# Patient Record
Sex: Male | Born: 1960 | Race: White | Hispanic: No | Marital: Married | Smoking: Never smoker
Health system: Southern US, Community
[De-identification: ages and names within clinical notes are randomized; demographics above are authoritative.]

## PROBLEM LIST (undated history)

## (undated) DIAGNOSIS — I10 Essential (primary) hypertension: Secondary | ICD-10-CM

## (undated) DIAGNOSIS — C801 Malignant (primary) neoplasm, unspecified: Secondary | ICD-10-CM

## (undated) HISTORY — PX: BACK SURGERY: SHX140

## (undated) HISTORY — PX: ABDOMINAL SURGERY: SHX537

## (undated) HISTORY — PX: KNEE ARTHROSCOPY: SUR90

## (undated) HISTORY — PX: LITHOTRIPSY: SUR834

## (undated) HISTORY — PX: HAND SURGERY: SHX662

---

## 1998-10-06 ENCOUNTER — Encounter: Payer: Self-pay | Admitting: Urology

## 1998-10-06 ENCOUNTER — Ambulatory Visit (HOSPITAL_COMMUNITY): Admission: RE | Admit: 1998-10-06 | Discharge: 1998-10-06 | Payer: Self-pay | Admitting: Urology

## 2005-02-07 ENCOUNTER — Emergency Department (HOSPITAL_COMMUNITY): Admission: EM | Admit: 2005-02-07 | Discharge: 2005-02-07 | Payer: Self-pay | Admitting: Emergency Medicine

## 2005-02-08 ENCOUNTER — Ambulatory Visit (HOSPITAL_COMMUNITY): Admission: RE | Admit: 2005-02-08 | Discharge: 2005-02-08 | Payer: Self-pay | Admitting: Urology

## 2005-03-21 ENCOUNTER — Ambulatory Visit (HOSPITAL_COMMUNITY): Admission: RE | Admit: 2005-03-21 | Discharge: 2005-03-21 | Payer: Self-pay | Admitting: Urology

## 2005-03-26 ENCOUNTER — Ambulatory Visit (HOSPITAL_COMMUNITY): Admission: RE | Admit: 2005-03-26 | Discharge: 2005-03-26 | Payer: Self-pay | Admitting: Family Medicine

## 2006-12-15 ENCOUNTER — Inpatient Hospital Stay (HOSPITAL_COMMUNITY): Admission: EM | Admit: 2006-12-15 | Discharge: 2006-12-18 | Payer: Self-pay | Admitting: Emergency Medicine

## 2007-10-06 ENCOUNTER — Inpatient Hospital Stay (HOSPITAL_COMMUNITY): Admission: AC | Admit: 2007-10-06 | Discharge: 2007-10-10 | Payer: Self-pay

## 2007-10-27 ENCOUNTER — Encounter: Admission: RE | Admit: 2007-10-27 | Discharge: 2008-01-25 | Payer: Self-pay | Admitting: Orthopedic Surgery

## 2009-03-24 ENCOUNTER — Encounter: Admission: RE | Admit: 2009-03-24 | Discharge: 2009-06-22 | Payer: Self-pay | Admitting: Orthopedic Surgery

## 2010-03-04 ENCOUNTER — Emergency Department (HOSPITAL_COMMUNITY)
Admission: EM | Admit: 2010-03-04 | Discharge: 2010-03-04 | Payer: Self-pay | Source: Home / Self Care | Admitting: Emergency Medicine

## 2010-03-06 ENCOUNTER — Emergency Department (HOSPITAL_COMMUNITY)
Admission: EM | Admit: 2010-03-06 | Discharge: 2010-03-06 | Payer: Self-pay | Source: Home / Self Care | Admitting: Emergency Medicine

## 2010-07-23 ENCOUNTER — Encounter: Payer: Self-pay | Admitting: Family Medicine

## 2010-09-14 LAB — URINALYSIS, ROUTINE W REFLEX MICROSCOPIC
Glucose, UA: NEGATIVE mg/dL
Leukocytes, UA: NEGATIVE
Nitrite: NEGATIVE
Protein, ur: 30 mg/dL — AB
Specific Gravity, Urine: 1.021 (ref 1.005–1.030)
pH: 6 (ref 5.0–8.0)

## 2010-09-14 LAB — URINE MICROSCOPIC-ADD ON
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
## 2010-11-14 NOTE — H&P (Signed)
NAME:  [name] [name], [name] [name] NO.:  [id number]   MEDICAL RECORD NO.:  [id number]          PATIENT TYPE:  INP   LOCATION:  4741                         FACILITY:  [facility]   PHYSICIAN:  Della Goo, M.D. DATE OF BIRTH:  January 23, 1961   DATE OF ADMISSION:  12/15/2006  DATE OF DISCHARGE:                              HISTORY & PHYSICAL   PRIMARY CARE PHYSICIAN:  [facility].   CHIEF COMPLAINT:  Nausea, vomiting, diarrhea.   HISTORY OF PRESENT ILLNESS:  This is a 50 year old male presenting to  the emergency department secondary to severe nausea, vomiting, diarrhea  x3 days.  He reports not being able to keep down any p.o. food or  fluids.  He reports having diffuse crampy abdominal pain.  He rates the  pain as being a 10/10 in intensity.  The patient reports his emesis as  being bilious.  He describes having watery diarrhea every 10 minutes.   The patient was medicated for his nausea and vomiting and received 6 L  of IV fluids for fluid rehydration and resuscitation in the emergency  department prior to admission.   The patient denies having any recent travel.  He denies eating any  unusual foods and also states that no one else at home is ill.  He  denies having any fevers, chills, chest pain, shortness of breath or  syncope.  He does report having severe weakness, cramping and severe  back pain which began before this illness and he associates his  medications of anti-inflammatory medications as causing his nausea,  vomiting and diarrhea.  The initial medications that he had taken were  Flexeril and Lodine for his back spasms.  This was on December 09, 2006, and  the prescription was changed secondary to his GI symptoms to ibuprofen  and Robaxin.  The patient does not report any relief from his symptoms  with the medication changes.   PAST MEDICAL HISTORY:  1. Irritable bowel syndrome.  2. History of nephrolithiasis.   PAST SURGICAL HISTORY:  1.  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  2. Two previous right knee surgeries.   ALLERGIES:  COMPAZINE.   SOCIAL HISTORY:  The patient is married.  Works as a Chartered certified accountant.  Nonsmoker, occasional alcohol usage.  No illicit drug usage.   FAMILY HISTORY:  Noncontributory.   REVIEW OF SYSTEMS:  Pertinents are mentioned above.   PHYSICAL EXAMINATION:  GENERAL:  A 50 year old, well-nourished, well-  developed male in discomfort, but no acute distress.  VITAL SIGNS:  Temperature 97.1, blood pressure 125/62, heart rate 85,  respirations 24.  HEENT:  Normocephalic, atraumatic.  There is no scleral icterus.  Pupils  equally round and reactive to light.  Extraocular muscles are intact.  Funduscopic benign.  Oropharynx is clear.  NECK:  Supple full range of motion.  No thyromegaly, adenopathy or  jugular venous distension.  CARDIOVASCULAR:  Regular rate and rhythm.  No murmurs, gallops or rubs.  LUNGS:  Clear to auscultation bilaterally.  ABDOMEN:  Positive bowel sounds, soft, mild diffuse tenderness.  No  rebound, no guarding.  No hepatosplenomegaly.  EXTREMITIES:  Without cyanosis, clubbing or edema.  NEUROLOGIC:  Alert and oriented x3.  There are no focal deficits,  however, the patient does have generalized weakness.   LABORATORY DATA AND X-RAY FINDINGS:  Sickles blood cell count 13.8,  hemoglobin 15.7, hematocrit 47.1, platelets 239, neutrophils 91%,  lymphocytes 6%.  Sodium 143, potassium 3.5, chloride 110, bicarb 22, BUN  8, creatinine 1.4, glucose 180.  Myoglobin 148, CK-MB less than 1.0,  troponin less than 0.05.  Urinalysis cloudy, small leukocyte esterase,  small urine bilirubin and 30 mg/dL of protein.   ASSESSMENT:  20. A 50 year old male being admitted with acute gastroenteritis,      nausea, vomiting, diarrhea.  2. Dehydration.  3. Pyuria.  4. Back pain and muscle  spasms.   PLAN:  The patient will be admitted to telemetry for monitoring.  He  will be placed on a clear liquid diet for now.  IV fluids have been  ordered for rehydration therapy along with antiemetic therapy and pain  control therapies.  Stool studies will be sent for culture and  sensitivity, C. difficile studies and ova and parasites.  The patient  will also have additional laboratory studies run this morning.  A lipase  level will be checked along with liver function test.  An acute  abdominal series will also be ordered and an abdominal ultrasound study.  The patient will be placed on DVT and GI prophylaxis at this time.      Della Goo, M.D.  Electronically Signed     HJ/MEDQ  D:  12/16/2006  T:  12/16/2006  Job:  [id number]

## 2010-11-14 NOTE — Discharge Summary (Signed)
NAME:  [name] [name], [name] [name] NO.:  [id number]   MEDICAL RECORD NO.:  [id number]          PATIENT TYPE:  INP   LOCATION:  4741                         FACILITY:  [facility]   PHYSICIAN:  Wilson Singer, M.D.DATE OF BIRTH:  31-Dec-1960   DATE OF ADMISSION:  12/15/2006  DATE OF DISCHARGE:  12/18/2006                               DISCHARGE SUMMARY   FINAL DISCHARGE DIAGNOSES:  1. Gastroenteritis, probably viral.  2. Dehydration.  3. Back pain with muscular sprain.   MEDICATIONS ON DISCHARGE:  Tramadol 50 mg 1 to 2 tablets b.i.d. as  required.   CONDITION ON DISCHARGE:  Stable.   HISTORY:  This 50 year old man gave a 3- to 4-day history of nausea,  vomiting, and diarrhea with cramping abdominal pain.  Please see initial  history and physical examination by Dr. Della Goo.  On admission,  his creatinine was elevated at 1.4 and BUN of 8.  He was treated with  intravenous fluids.  Stool was taken for C. diff toxin, which was  negative.  Also, stool cultures were negative.  Stool was also negative  for Giardia and Cryptosporidium.  Throughout his hospital stay, he  seemed to be improving.  He was empirically started on ciprofloxacin,  but in the face of absent culture results, this was discontinued on the  day of discharge.  On the day of discharge, he had been doing well, and  he had had formed stool for the last 24 hours.  There was no abdominal  pain, no nausea or vomiting.  He is eating and drinking well.   On examination today:  VITAL SIGNS:  Temperature 98.5, blood pressure 131/87, pulse 74,  saturation on room air 96%.  His abdomen is soft and nontender.   INVESTIGATIONS:  Today, show a sodium of 141, potassium 3.8, bicarbonate  26, BUN 6, creatinine 0.99.   FURTHER DISPOSITION:  He did have an ultrasound of his abdomen, whilst  in the hospital because of his abdominal pain, but this only showed mild  fatty liver infiltration.  There were no other worrisome  features.  He  has been given a prescription for tramadol to be used for his back pain  in the short term.  I have urged him to make sure he follows up with the  primary care physician.      Wilson Singer, M.D.  Electronically Signed     NCG/MEDQ  D:  12/18/2006  T:  12/18/2006  Job:  [id number]   cc:   [facility]
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
## 2010-11-30 ENCOUNTER — Encounter (HOSPITAL_COMMUNITY)
Admission: RE | Admit: 2010-11-30 | Discharge: 2010-11-30 | Disposition: A | Discharge status: Home / Self Care | Payer: PRIVATE HEALTH INSURANCE | Source: Ambulatory Visit | Attending: Orthopedic Surgery | Admitting: Orthopedic Surgery

## 2010-11-30 ENCOUNTER — Other Ambulatory Visit (HOSPITAL_COMMUNITY): Payer: Self-pay | Admitting: Orthopedic Surgery

## 2010-11-30 ENCOUNTER — Ambulatory Visit (HOSPITAL_COMMUNITY)
Admission: RE | Admit: 2010-11-30 | Discharge: 2010-11-30 | Disposition: A | Discharge status: Home / Self Care | Payer: PRIVATE HEALTH INSURANCE | Source: Ambulatory Visit | Attending: Orthopedic Surgery | Admitting: Orthopedic Surgery

## 2010-11-30 DIAGNOSIS — Z01810 Encounter for preprocedural cardiovascular examination: Secondary | ICD-10-CM | POA: Insufficient documentation

## 2010-11-30 DIAGNOSIS — M5126 Other intervertebral disc displacement, lumbar region: Secondary | ICD-10-CM

## 2010-11-30 DIAGNOSIS — Z01812 Encounter for preprocedural laboratory examination: Secondary | ICD-10-CM | POA: Insufficient documentation

## 2010-11-30 DIAGNOSIS — Z01818 Encounter for other preprocedural examination: Secondary | ICD-10-CM | POA: Insufficient documentation

## 2010-11-30 LAB — CBC
HCT: 45.8 % (ref 39.0–52.0)
Hemoglobin: 15.6 g/dL (ref 13.0–17.0)
MCH: 31.9 pg (ref 26.0–34.0)
MCV: 93.7 fL (ref 78.0–100.0)
Platelets: 221 10*3/uL (ref 150–400)
WBC: 6.2 10*3/uL (ref 4.0–10.5)

## 2010-11-30 LAB — URINALYSIS, ROUTINE W REFLEX MICROSCOPIC
Glucose, UA: NEGATIVE mg/dL
Ketones, ur: NEGATIVE mg/dL
Protein, ur: NEGATIVE mg/dL
Specific Gravity, Urine: 1.021 (ref 1.005–1.030)
pH: 5.5 (ref 5.0–8.0)

## 2010-11-30 LAB — COMPREHENSIVE METABOLIC PANEL
AST: 18 U/L (ref 0–37)
Albumin: 4.1 g/dL (ref 3.5–5.2)
Alkaline Phosphatase: 61 U/L (ref 39–117)
BUN: 14 mg/dL (ref 6–23)
Chloride: 102 mEq/L (ref 96–112)
GFR calc Af Amer: >60 mL/min (ref >60)
Potassium: 4.5 mEq/L (ref 3.5–5.1)
Sodium: 139 mEq/L (ref 135–145)
Total Bilirubin: 0.5 mg/dL (ref 0.3–1.2)
Total Protein: 6.8 g/dL (ref 6.0–8.3)

## 2010-11-30 LAB — SURGICAL PCR SCREEN: MRSA, PCR: NEGATIVE

## 2010-11-30 LAB — APTT: aPTT: 28 seconds (ref 24–37)

## 2010-11-30 LAB — ABO/RH: ABO/RH(D): A POS

## 2010-11-30 LAB — DIFFERENTIAL
Lymphocytes Relative: 26 % (ref 12–46)
Neutro Abs: 3.9 10*3/uL (ref 1.7–7.7)

## 2010-11-30 LAB — TYPE AND SCREEN: Antibody Screen: NEGATIVE

## 2010-12-04 ENCOUNTER — Other Ambulatory Visit: Payer: Self-pay | Admitting: Orthopedic Surgery

## 2010-12-04 ENCOUNTER — Inpatient Hospital Stay (HOSPITAL_COMMUNITY)
Admission: RE | Admit: 2010-12-04 | Discharge: 2010-12-05 | DRG: 491 | Disposition: A | Discharge status: Home / Self Care | Payer: PRIVATE HEALTH INSURANCE | Source: Ambulatory Visit | Attending: Orthopedic Surgery | Admitting: Orthopedic Surgery

## 2010-12-04 ENCOUNTER — Inpatient Hospital Stay (HOSPITAL_COMMUNITY): Payer: PRIVATE HEALTH INSURANCE

## 2010-12-04 DIAGNOSIS — K219 Gastro-esophageal reflux disease without esophagitis: Secondary | ICD-10-CM | POA: Diagnosis present

## 2010-12-04 DIAGNOSIS — I10 Essential (primary) hypertension: Secondary | ICD-10-CM | POA: Diagnosis present

## 2010-12-04 DIAGNOSIS — M5126 Other intervertebral disc displacement, lumbar region: Principal | ICD-10-CM | POA: Diagnosis present

## 2010-12-06 ENCOUNTER — Emergency Department (HOSPITAL_COMMUNITY): Payer: PRIVATE HEALTH INSURANCE

## 2010-12-06 ENCOUNTER — Inpatient Hospital Stay (HOSPITAL_COMMUNITY)
Admission: EM | Admit: 2010-12-06 | Discharge: 2010-12-09 | DRG: 195 | Disposition: A | Discharge status: Home / Self Care | Payer: PRIVATE HEALTH INSURANCE | Source: Ambulatory Visit | Attending: Internal Medicine | Admitting: Internal Medicine

## 2010-12-06 DIAGNOSIS — E876 Hypokalemia: Secondary | ICD-10-CM | POA: Diagnosis present

## 2010-12-06 DIAGNOSIS — J189 Pneumonia, unspecified organism: Principal | ICD-10-CM | POA: Diagnosis present

## 2010-12-06 DIAGNOSIS — I10 Essential (primary) hypertension: Secondary | ICD-10-CM | POA: Diagnosis present

## 2010-12-06 DIAGNOSIS — K5900 Constipation, unspecified: Secondary | ICD-10-CM | POA: Diagnosis present

## 2010-12-06 LAB — URINALYSIS, ROUTINE W REFLEX MICROSCOPIC
Bilirubin Urine: NEGATIVE
Glucose, UA: NEGATIVE mg/dL
Hgb urine dipstick: NEGATIVE
Ketones, ur: NEGATIVE mg/dL
Nitrite: NEGATIVE
Protein, ur: NEGATIVE mg/dL
Specific Gravity, Urine: 1.013 (ref 1.005–1.030)
Urobilinogen, UA: 0.2 mg/dL (ref 0.0–1.0)
pH: 6.5 (ref 5.0–8.0)

## 2010-12-06 LAB — TROPONIN I: Troponin I: <0.3 ng/mL (ref <0.30)

## 2010-12-06 LAB — CBC
HCT: 36.2 % — ABNORMAL LOW (ref 39.0–52.0)
Hemoglobin: 13.3 g/dL (ref 13.0–17.0)
MCHC: 34 g/dL (ref 30.0–36.0)
Platelets: 181 10*3/uL (ref 150–400)
Platelets: 196 10*3/uL (ref 150–400)
RBC: 4.18 MIL/uL — ABNORMAL LOW (ref 4.22–5.81)
RDW: 12.2 % (ref 11.5–15.5)
WBC: 9.6 10*3/uL (ref 4.0–10.5)

## 2010-12-06 LAB — DIFFERENTIAL
Basophils Relative: 0 % (ref 0–1)
Eosinophils Absolute: 0.1 10*3/uL (ref 0.0–0.7)
Lymphs Abs: 0.7 10*3/uL (ref 0.7–4.0)
Monocytes Relative: 6 % (ref 3–12)
Neutro Abs: 8.1 10*3/uL — ABNORMAL HIGH (ref 1.7–7.7)
Neutrophils Relative %: 85 % — ABNORMAL HIGH (ref 43–77)

## 2010-12-06 LAB — COMPREHENSIVE METABOLIC PANEL
ALT: 13 U/L (ref 0–53)
Albumin: 3.4 g/dL — ABNORMAL LOW (ref 3.5–5.2)
BUN: 10 mg/dL (ref 6–23)
BUN: 16 mg/dL (ref 6–23)
Calcium: 8 mg/dL — ABNORMAL LOW (ref 8.4–10.5)
Calcium: 8.9 mg/dL (ref 8.4–10.5)
Creatinine, Ser: 1.04 mg/dL (ref 0.4–1.5)
GFR calc Af Amer: >60 mL/min (ref >60)
Glucose, Bld: 119 mg/dL — ABNORMAL HIGH (ref 70–99)
Sodium: 139 mEq/L (ref 135–145)
Total Bilirubin: 0.6 mg/dL (ref 0.3–1.2)
Total Protein: 5.2 g/dL — ABNORMAL LOW (ref 6.0–8.3)
Total Protein: 6.1 g/dL (ref 6.0–8.3)

## 2010-12-06 LAB — POCT I-STAT, CHEM 8
BUN: 15 mg/dL (ref 6–23)
Calcium, Ion: 1.13 mmol/L (ref 1.12–1.32)
Chloride: 103 mEq/L (ref 96–112)
Creatinine, Ser: 1.3 mg/dL (ref 0.4–1.5)
Glucose, Bld: 124 mg/dL — ABNORMAL HIGH (ref 70–99)
HCT: 40 % (ref 39.0–52.0)
Hemoglobin: 13.6 g/dL (ref 13.0–17.0)
Potassium: 3.4 mEq/L — ABNORMAL LOW (ref 3.5–5.1)
Sodium: 138 mEq/L (ref 135–145)
TCO2: 23 mmol/L (ref 0–100)

## 2010-12-06 LAB — PROTIME-INR
INR: 0.99 (ref 0.00–1.49)
Prothrombin Time: 13.3 seconds (ref 11.6–15.2)

## 2010-12-06 LAB — CK TOTAL AND CKMB (NOT AT ARMC)
CK, MB: 1.2 ng/mL (ref 0.3–4.0)
Relative Index: 0.7 (ref 0.0–2.5)
Total CK: 182 U/L (ref 7–232)

## 2010-12-06 LAB — HIV ANTIBODY (ROUTINE TESTING W REFLEX): HIV: NONREACTIVE

## 2010-12-06 LAB — D-DIMER, QUANTITATIVE: D-Dimer, Quant: 0.54 ug/mL-FEU — ABNORMAL HIGH (ref 0.00–0.48)

## 2010-12-06 LAB — MAGNESIUM: Magnesium: 1.7 mg/dL (ref 1.5–2.5)

## 2010-12-06 LAB — APTT: aPTT: 36 seconds (ref 24–37)

## 2010-12-06 MED ORDER — IOHEXOL 300 MG/ML  SOLN
100.0000 mL | Freq: Once | INTRAMUSCULAR | Status: AC | PRN
  Administered 2010-12-06: 100 mL via INTRAVENOUS

## 2010-12-07 LAB — CBC
HCT: 36.8 % — ABNORMAL LOW (ref 39.0–52.0)
Hemoglobin: 12.4 g/dL — ABNORMAL LOW (ref 13.0–17.0)
WBC: 9.2 10*3/uL (ref 4.0–10.5)

## 2010-12-07 LAB — BASIC METABOLIC PANEL
Chloride: 104 mEq/L (ref 96–112)
GFR calc Af Amer: >60 mL/min (ref >60)
Potassium: 3.7 mEq/L (ref 3.5–5.1)

## 2010-12-08 LAB — CBC
Hemoglobin: 12.9 g/dL — ABNORMAL LOW (ref 13.0–17.0)
MCH: 31.3 pg (ref 26.0–34.0)
MCV: 92.5 fL (ref 78.0–100.0)
RBC: 4.12 MIL/uL — ABNORMAL LOW (ref 4.22–5.81)

## 2010-12-08 LAB — BASIC METABOLIC PANEL
BUN: 8 mg/dL (ref 6–23)
CO2: 29 mEq/L (ref 19–32)
Chloride: 102 mEq/L (ref 96–112)
Creatinine, Ser: 0.79 mg/dL (ref 0.4–1.5)
GFR calc Af Amer: >60 mL/min (ref >60)

## 2010-12-09 LAB — BASIC METABOLIC PANEL
BUN: 7 mg/dL (ref 6–23)
Chloride: 101 mEq/L (ref 96–112)
Creatinine, Ser: 0.75 mg/dL (ref 0.4–1.5)
GFR calc Af Amer: >60 mL/min (ref >60)
Glucose, Bld: 92 mg/dL (ref 70–99)

## 2010-12-09 LAB — CBC
HCT: 41.1 % (ref 39.0–52.0)
Hemoglobin: 14.5 g/dL (ref 13.0–17.0)
MCV: 91.7 fL (ref 78.0–100.0)
RDW: 12 % (ref 11.5–15.5)
WBC: 7.7 10*3/uL (ref 4.0–10.5)

## 2010-12-12 LAB — CULTURE, BLOOD (ROUTINE X 2): Culture: NO GROWTH

## 2010-12-14 NOTE — Discharge Summary (Signed)
[name], [name] NO.:  [id number]  MEDICAL RECORD NO.:  [id number]  LOCATION:  5506                         FACILITY:  [facility]  PHYSICIAN:  [name] Barefoot, MD    DATE OF BIRTH:  Mar 25, 1961  DATE OF ADMISSION:  12/06/2010 DATE OF DISCHARGE:  12/09/2010                              DISCHARGE SUMMARY   DISCHARGE DIAGNOSES: 1. Healthcare-associated pneumonia. 2. Hypokalemia.  OTHER PAST MEDICAL HISTORY: 1. Hypertension. 2. Recent low back surgery for L3-L4 disk herniation.  DISCHARGE MEDICATIONS: 1. Bactrim 1 tablet by mouth b.i.d. 2. Docusate 100 mg b.i.d. 3. Levaquin 750 mg p.o. daily. 4. MiraLax 17 g by mouth daily. 5. Lisinopril 10 mg 1 tablet by mouth daily. 6. Omeprazole 20 mg 1 tablet by mouth daily. 7. Oxycodone 15 mg half a tablet by mouth q.4 h as needed. 8. OxyContin 60 mg 1 tablet by mouth twice daily as needed. 9. Percocet 7.5/325 one tablet by mouth 3 times a day as needed.  DISPOSITION AND FOLLOWUP:  [name] [name] will need to follow up with his primary care physician within a week.  She will need also to follow with his orthopedic doctor.  STUDIES PERFORMED: 1. CT angio.  No evidence of significant pulmonary embolus, airspace     infiltration in the left lung likely representing pneumonia. 2. X-ray showed interval development of focal airspace disease in the     left midlung suggesting pneumonia on December 06, 2010.  BRIEF HISTORY OF PRESENT ILLNESS:  This is a 50 year old with history of hypertension, recent back surgery on December 04, 2010, at [facility] by Dr. Alveda Reasons for L3-L4 disk herniation and extrusion on the left side.  He went home on December 05, 2010, after which, the patient developed shortness of breath, cough, fevers and chills, temperature of 103 at home.  He came to the ER and x-ray showed pneumonia. 1. Healthcare-associated pneumonia.  The patient was admitted to     telemetry.  He was started on IV vancomycin, cefepime, and  Levaquin.  He received a total of 4 days of IV Levaquin and     cefepime, 3 days of vancomycin.  He will be discharged on Levaquin     p.o. for a total of 10 days and with Bactrim for a total of 8 days.     On the day of discharge, the patient was in good condition, [place]     blood cells were trending down, temperature of 99.  He is willing     to go home.  The patient will need to have a very close followup. 2. Constipation.  He was started on a bowel regimen.  He had a small     bowel movement prior to discharge. 3. Hypertension.  We are going to restart his lisinopril. 4. Hypokalemia, repleted.  On the day of discharge, the patient was in improved condition.  No shortness of breath or chest pain.  He is willing to go home.  Blood pressure 137/87, sat 97 on room air, respirations 16, pulse 62, temperature 99.0.  [place] blood cell 7.7, hemoglobin 14, platelet 241. Sodium 140, potassium 3.8, chloride 101, bicarb 29, BUN  7, creatinine 0.75.     [name] Barefoot, MD     BR/MEDQ  D:  12/09/2010  T:  12/10/2010  Job:  [id number]  Electronically Signed by [name] Barefoot MD on 12/14/2010 62:13:08 PM

## 2010-12-25 NOTE — H&P (Signed)
NAME:  [name] [name], [name] [name] NO.:  [id number]  MEDICAL RECORD NO.:  [id number]  LOCATION:  [facility]                         FACILITY:  [facility]  PHYSICIAN:  Eduard Clos, MDDATE OF BIRTH:  [birth year]  DATE OF ADMISSION:  12/06/2010 DATE OF DISCHARGE:                             HISTORY & PHYSICAL   PATIENT'S PRIMARY CARE PHYSICIAN:  Samuel Jester, MD at [facility].  SURGEON:  Nelda Severe, MD  CHIEF COMPLAINT:  Shortness of breath and fever.  HISTORY OF PRESENTING ILLNESS:  A 50 year old male with known history of hypertension who has had a low back surgery on December 04, 2010, at [facility] by Dr. Alveda Reasons for L4-L3 disk herniation and extrusion on the left side.  Had gone home on December 05, 2010, after which the patient develops shortness of breath, cough and fever and chills.  Temperature that occurred at home was measured at 103.  The patient came to the ER. X-ray at this time is showing pneumonic process.  The patient has been admitted for further workup.  The patient at this time denies any chest pain, shortness of breath that improved, had some nausea, has not thrown up, denies any abdominal pain, dysuria, discharge, diarrhea.  His surgical site at this time does not show any discharge.  Denies any headache or visual symptoms, any focal deficit, dizziness or loss of consciousness.  PAST MEDICAL HISTORY:  History of hypertension and GERD.  PAST SURGICAL HISTORY:  Has had surgeries for both his knee and also his right hand and recent surgery for his low back for L3-L4 disk herniation and extrusion.  MEDICATIONS PRIOR TO ADMISSION: 1. Percocet 7.5/325 p.o. as needed. 2. Omeprazole. 3. Lisinopril 10 mg. 4. OxyContin.  ALLERGIES:  COMPAZINE.  FAMILY HISTORY:  Significant for diabetes and hypertension.  SOCIAL HISTORY:  The patient is married.  He denies smoking cigarettes or drinking alcohol or using illegal drugs.  REVIEW OF SYSTEMS:  As per history  of presenting illness, nothing else significant.  PHYSICAL EXAMINATION:  GENERAL:  The patient examined at bedside, not in acute distress. VITAL SIGNS:  Blood pressure 130/75, pulse is 98 per minute, temperature 99.8, respirations 18 per minute and O2 sat 98% on room air. HEENT:  Anicteric.  No pallor.  No discharge from ears, eyes, nose and mouth. CHEST:  Bilateral air entry present.  No rhonchi, no crepitation. HEART:  S1 and S2 heard. ABDOMEN:  Soft and nontender.  Bowel sounds heard. CNS:  Alert, awake and oriented to time, place and person.  Moves upper and lower extremities 5/5. EXTREMITIES:  Peripheral pulses felt.  No edema.  LABORATORY DATA:  Chest x-ray shows interval development of focal airspace disease in the left mid lung suggesting pneumonia.  UA shows negative for nitrites and leukocyte.  CBC, WBC is 9.6, hemoglobin is 13.3, hematocrit is 38.7, platelets 196.  PT/INR 13.3 and 0.9.  Basic metabolic panel; sodium 139, potassium 3.5, chloride 104, carbon dioxide 25, glucose 120, BUN 15, creatinine 1, albumin 3.4, AST 15, AST 15. Some of these labs are not worsening over at this time.  Chest x-ray interval development of focal airspace disease in the left  mid lung suggesting pneumonia.  ASSESSMENT: 1. Pneumonia. 2. History of hypertension. 3. History of recent low back surgery in December 04, 2010, for L3-L4 disk     herniation.  PLAN: 1. At this time, we will admit the patient to telemetry. 2. For his pneumonia, I did discuss with Dr. Tyson Alias of Critical     Care with regard to the antibiotic regimen.  Dr. Tyson Alias has     recommended to treat nosocomial pneumonia for which we are going to     place the patient on vancomycin, cefepime. 3. As the patient has just had a recent surgery and wants to do D-     dimer, if it is high, we will get a CT angio chest. 4. Further recommendation as condition evolves.     Eduard Clos, MD     ANK/MEDQ  D:   12/06/2010  T:  12/06/2010  Job:  [id number]  cc:   Samuel Jester, MD Nelda Severe, MD  Electronically Signed by Midge Minium MD on 12/25/2010 04:54:09 AM

## 2010-12-27 NOTE — Op Note (Signed)
[name] [name], [name] [name] NO.:  [id number]  MEDICAL RECORD NO.:  [id number]  LOCATION:  5010                         FACILITY:  [facility]  PHYSICIAN:  Nelda Severe, MD      DATE OF BIRTH:  05-15-1961  DATE OF PROCEDURE:  12/04/2010 DATE OF DISCHARGE:                              OPERATIVE REPORT   SURGEON:  Nelda Severe, MD  ASSISTANT:  Lianne Cure, PA-C  PREOPERATIVE DIAGNOSES:  L3-4 disk herniation/extrusion on left side, L4- 5 stenosis.  POSTOPERATIVE DIAGNOSES:  L3-4 disk herniation/extrusion on left side, L4-5 stenosis.  OPERATIVE PROCEDURES:  Left L3-4 disk excision, left L4-5 lateral recess decompression/foraminotomy.  OPERATIVE NOTE:  The patient was placed under general endotracheal anesthesia.  Sequential compression devices were placed on both lower extremities.  He was given a gram of vancomycin intravenously.  He was positioned prone on a Jackson frame.  Care was taken to position the upper extremities so as to avoid hyperflexion and abduction of the shoulders and so as to avoid hyperflexion of the elbows.  Upper extremities was padded with foam from hands to axilla.  Thighs, knees, shins, and ankles were supported on pillows.  The site of incision was marked with a skin marker slightly left in the midline what was just to be the L3-4 and L4-5 levels.  The lumbar area was prepped DuraPrep and draped in a rectangular fashion.  The drapes were secured with Ioban.  A time-out was held during which all of the usual parameters were discussed/confirmed.  Skin was scored in line with the marked incision.  Subcutaneous tissue was injected with a mixture of 0.25% plain Marcaine and 1% lidocaine with epinephrine.  Incision was deepened to the thoracolumbar fascia which was cut longitudinally just to the left to the midline. Paraspinal muscles were reflected to the left revealing what I perceived to be the L4 lamina and spinous process.  This was  marked with Kocher. Cross-table lateral radiograph was taken confirming level.  We placed a deep Taylor retractor lateral to the apophyseal joint at L3-4.  The L3-4 interspace on the left side was cleared of soft tissue.  A high-speed bur was used to perform a conservative medial facetectomy at L3-4 on the left and laminectomy extended somewhat proximally.  Kerrison rongeur was then used to complete the laminotomy.  Ligamentum flavum was removed. There was a lot of engorgement of epidural veins which were coagulated. The L3-4 disk was soft and patulous and there was elevated posterior longitudinal ligament proximal to the L3-4 disk under which the extruded fragments which were numerous small fragments were sequestrated.  These were hooked out with a nerve hook.  The disk was further enucleated using curettes and pituitary rongeurs.  The origins of the descending L4 root and course of the L3 root appeared well decompressed.  I then performed a left-sided L4 laminectomy and L4-5 subarticular decompression.  This was obviously very tight as I removed ligamentum flavum, dura and proximal L4 root were bulging into the laminotomy. Ultimately, decompressive efforts were ceased when I was able to easily pass a Murphy ball probe into the L4-5 foramen and felt that we had adequately  decompressed the L4 nerve root.  Again, I checked the laminotomy site at L3-4 and passed a Murphy ball probe into the L3-4 foramen and felt that there was no obstruction.  There was some epidural oozing which was controlled with FloSeal. Bipolar coagulation was also used.  A #15 gauge Blake drain was placed subfascially and brought out through the skin to the left side and secured with 2-0 nylon suture.  The thoracolumbar fascia was closed using multiple interrupted #1 Vicryl sutures.  The subcutaneous layer was closed using inverted 2-0 undyed Vicryl.  The skin was closed with subcuticular 3-0 undyed Vicryl in  a running fashion and the skin edges were reinforced with Dermabond.  A Mepilex stressing was applied.  The blood loss was estimated less than 50 mL.  There were no intraoperative complications.  At the time of dictation, the patient has not yet been transferred to the operating room, so no neurologic examination is reported here.     Nelda Severe, MD     MT/MEDQ  D:  12/04/2010  T:  12/04/2010  Job:  [id number]  Electronically Signed by Nelda Severe MD on 12/27/2010 05:12:09 PM

## 2011-03-02 ENCOUNTER — Emergency Department (HOSPITAL_COMMUNITY)
Admission: EM | Admit: 2011-03-02 | Discharge: 2011-03-03 | Disposition: A | Discharge status: Home / Self Care | Payer: PRIVATE HEALTH INSURANCE | Attending: Emergency Medicine | Admitting: Emergency Medicine

## 2011-03-02 DIAGNOSIS — R109 Unspecified abdominal pain: Secondary | ICD-10-CM | POA: Insufficient documentation

## 2011-03-02 DIAGNOSIS — N200 Calculus of kidney: Secondary | ICD-10-CM | POA: Insufficient documentation

## 2011-03-02 DIAGNOSIS — I10 Essential (primary) hypertension: Secondary | ICD-10-CM | POA: Insufficient documentation

## 2011-03-02 LAB — URINALYSIS, ROUTINE W REFLEX MICROSCOPIC
Bilirubin Urine: NEGATIVE
Glucose, UA: NEGATIVE mg/dL
Ketones, ur: NEGATIVE mg/dL
Nitrite: NEGATIVE
pH: 5.5 (ref 5.0–8.0)

## 2011-03-27 LAB — PROTIME-INR
INR: 1
INR: 1
Prothrombin Time: 13
Prothrombin Time: 13.6

## 2011-03-27 LAB — CBC
HCT: 30.9 — ABNORMAL LOW
Hemoglobin: 10.1 — ABNORMAL LOW
Hemoglobin: 10.7 — ABNORMAL LOW
Hemoglobin: 11.2 — ABNORMAL LOW
MCHC: 34.5
MCHC: 34.8
MCHC: 34.8
MCV: 93.3
Platelets: 152
Platelets: 223
RBC: 3.12 — ABNORMAL LOW
RBC: 3.32 — ABNORMAL LOW
RBC: 3.42 — ABNORMAL LOW
RBC: 4.23
RDW: 14.7
RDW: 14.8
WBC: 11.9 — ABNORMAL HIGH
WBC: 12.8 — ABNORMAL HIGH
WBC: 8.9

## 2011-03-27 LAB — ABO/RH: ABO/RH(D): A POS

## 2011-03-27 LAB — COMPREHENSIVE METABOLIC PANEL
ALT: 52
AST: 71 — ABNORMAL HIGH
Albumin: 3.1 — ABNORMAL LOW
CO2: 23
Calcium: 8.2 — ABNORMAL LOW
Creatinine, Ser: 1
GFR calc Af Amer: >60
Sodium: 144
Total Protein: 5.4 — ABNORMAL LOW

## 2011-03-27 LAB — POCT I-STAT 4, (NA,K, GLUC, HGB,HCT)
Glucose, Bld: 131 — ABNORMAL HIGH
HCT: 35 — ABNORMAL LOW
Operator id: 219281

## 2011-03-27 LAB — TYPE AND SCREEN
ABO/RH(D): A POS
Antibody Screen: NEGATIVE

## 2011-03-27 LAB — BASIC METABOLIC PANEL
BUN: 8
CO2: 28
Calcium: 7.9 — ABNORMAL LOW
Creatinine, Ser: 0.88
GFR calc non Af Amer: >60
Glucose, Bld: 107 — ABNORMAL HIGH
Sodium: 139

## 2011-03-27 LAB — POCT I-STAT, CHEM 8
BUN: 11
Calcium, Ion: 1.13
Chloride: 110
Glucose, Bld: 141 — ABNORMAL HIGH
HCT: 42
TCO2: 23

## 2011-03-27 LAB — AMYLASE
Amylase: 253 — ABNORMAL HIGH
Amylase: 530 — ABNORMAL HIGH

## 2011-03-27 LAB — SAMPLE TO BLOOD BANK

## 2011-04-18 LAB — CBC
Hemoglobin: 15.7
MCHC: 33.3
MCV: 89.7
Platelets: 196
RBC: 5.25
WBC: 13.8 — ABNORMAL HIGH
WBC: 8.8

## 2011-04-18 LAB — HEPATIC FUNCTION PANEL
AST: 19
AST: 23
Albumin: 3.3 — ABNORMAL LOW
Albumin: 4.3
Alkaline Phosphatase: 50
Alkaline Phosphatase: 68
Total Bilirubin: 0.9
Total Protein: 6
Total Protein: 7.5

## 2011-04-18 LAB — URINALYSIS, ROUTINE W REFLEX MICROSCOPIC
Glucose, UA: NEGATIVE
Ketones, ur: NEGATIVE
Nitrite: NEGATIVE
Specific Gravity, Urine: 1.023
pH: 6

## 2011-04-18 LAB — RAPID URINE DRUG SCREEN, HOSP PERFORMED
Amphetamines: NOT DETECTED
Tetrahydrocannabinol: NOT DETECTED

## 2011-04-18 LAB — DIFFERENTIAL
Basophils Absolute: 0
Basophils Relative: 0
Lymphocytes Relative: 24
Lymphocytes Relative: 6 — ABNORMAL LOW
Lymphs Abs: 2.1
Monocytes Absolute: 0.5
Monocytes Relative: 4
Neutro Abs: 12.6 — ABNORMAL HIGH
Neutrophils Relative %: 67
Neutrophils Relative %: 91 — ABNORMAL HIGH

## 2011-04-18 LAB — GIARDIA/CRYPTOSPORIDIUM SCREEN(EIA)

## 2011-04-18 LAB — I-STAT 8, (EC8 V) (CONVERTED LAB)
BUN: 8
Bicarbonate: 22
Chloride: 110
Glucose, Bld: 180 — ABNORMAL HIGH
HCT: 49
Operator id: 192351
pCO2, Ven: 37 — ABNORMAL LOW
pH, Ven: 7.382 — ABNORMAL HIGH

## 2011-04-18 LAB — BASIC METABOLIC PANEL
BUN: 3 — ABNORMAL LOW
BUN: 6
BUN: 6
CO2: 25
Chloride: 106
Creatinine, Ser: 0.84
Creatinine, Ser: 0.99
GFR calc Af Amer: >60
GFR calc Af Amer: >60
GFR calc non Af Amer: >60
GFR calc non Af Amer: >60
Potassium: 3.4 — ABNORMAL LOW
Potassium: 3.8
Potassium: 4

## 2011-04-18 LAB — CLOSTRIDIUM DIFFICILE EIA
C difficile Toxins A+B, EIA: NEGATIVE
C difficile Toxins A+B, EIA: NEGATIVE
C difficile Toxins A+B, EIA: NEGATIVE

## 2011-04-18 LAB — MAGNESIUM
Magnesium: 1.7
Magnesium: 1.8
Magnesium: 1.9

## 2011-04-18 LAB — STOOL CULTURE

## 2011-04-18 LAB — URINE MICROSCOPIC-ADD ON

## 2011-04-18 LAB — POCT CARDIAC MARKERS
CKMB, poc: <1 — ABNORMAL LOW
Myoglobin, poc: 148
Operator id: 192351
Troponin i, poc: <0.05

## 2011-04-18 LAB — LIPASE, BLOOD
Lipase: 19
Lipase: 20

## 2011-04-18 LAB — CK TOTAL AND CKMB (NOT AT ARMC): Relative Index: INVALID

## 2011-04-18 LAB — TSH: TSH: 0.534

## 2012-06-05 ENCOUNTER — Emergency Department (HOSPITAL_COMMUNITY)
Admission: EM | Admit: 2012-06-05 | Discharge: 2012-06-05 | Disposition: A | Discharge status: Home / Self Care | Payer: PRIVATE HEALTH INSURANCE | Attending: Emergency Medicine | Admitting: Emergency Medicine

## 2012-06-05 ENCOUNTER — Encounter (HOSPITAL_COMMUNITY): Payer: Self-pay | Admitting: *Deleted

## 2012-06-05 ENCOUNTER — Emergency Department (HOSPITAL_COMMUNITY): Payer: PRIVATE HEALTH INSURANCE

## 2012-06-05 DIAGNOSIS — I10 Essential (primary) hypertension: Secondary | ICD-10-CM | POA: Insufficient documentation

## 2012-06-05 DIAGNOSIS — Y9389 Activity, other specified: Secondary | ICD-10-CM | POA: Insufficient documentation

## 2012-06-05 DIAGNOSIS — Z9889 Other specified postprocedural states: Secondary | ICD-10-CM | POA: Insufficient documentation

## 2012-06-05 DIAGNOSIS — S20229A Contusion of unspecified back wall of thorax, initial encounter: Secondary | ICD-10-CM | POA: Insufficient documentation

## 2012-06-05 DIAGNOSIS — Z79899 Other long term (current) drug therapy: Secondary | ICD-10-CM | POA: Insufficient documentation

## 2012-06-05 DIAGNOSIS — Y929 Unspecified place or not applicable: Secondary | ICD-10-CM | POA: Insufficient documentation

## 2012-06-05 DIAGNOSIS — W010XXA Fall on same level from slipping, tripping and stumbling without subsequent striking against object, initial encounter: Secondary | ICD-10-CM | POA: Insufficient documentation

## 2012-06-05 HISTORY — DX: Essential (primary) hypertension: I10

## 2012-06-05 MED ORDER — OXYCODONE-ACETAMINOPHEN 5-325 MG PO TABS
2.0000 | ORAL_TABLET | Freq: Once | ORAL | Status: AC
  Administered 2012-06-05: 2 via ORAL
  Filled 2012-06-05: qty 2

## 2012-06-05 NOTE — ED Notes (Signed)
Pt reports slipping on wet steps, fell, and hit back. Pt has abrasion noted to back. Pt also c/o severe pain. States has had surgery June last year for bulging disc.

## 2012-06-05 NOTE — ED Provider Notes (Signed)
Medical screening examination/treatment/procedure(s) were performed by non-physician practitioner and as supervising physician I was immediately available for consultation/collaboration.  Vimal Derego, MD 06/05/12 2344 

## 2012-06-05 NOTE — ED Provider Notes (Signed)
History     CSN: [id number]  Arrival date & time 06/05/12  2104   First MD Initiated Contact with Patient 06/05/12 2121      Chief Complaint  Patient presents with  . Back Pain    (Consider location/radiation/quality/duration/timing/severity/associated sxs/prior treatment) HPI Comments: With low-back pain after slipping on his way back steps and falling directly onto his lower back about 2 hours ago. Denies hitting his head or loss of consciousness. He had surgery in the area where he hit one year ago and is concerned that he may have "messed something up". Currently pain is rated 7/10 which has slightly decreased after he took a Percocet directly after the fall. He takes Percocet daily for his chronic back pain after surgery. The Percocet provided mild relief. Denies pain, numbness or tingling down his extremities. No loss of control of bowels or bladder or saddle anesthesia. States there is an abrasion on his back.  The history is provided by the patient and the spouse.    Past Medical History  Diagnosis Date  . Hypertension     Past Surgical History  Procedure Date  . Back surgery     bulging disc repair  . Knee arthroscopy   . Hand surgery   . Lithotripsy     History reviewed. No pertinent family history.  History  Substance Use Topics  . Smoking status: Not on file  . Smokeless tobacco: Not on file  . Alcohol Use: No      Review of Systems  HENT: Negative for neck pain.   Musculoskeletal: Positive for back pain.  Skin: Positive for wound.  Neurological: Negative for numbness.    Allergies  Compazine  Home Medications   Current Outpatient Rx  Name  Route  Sig  Dispense  Refill  . ALPRAZOLAM 0.5 MG PO TABS   Oral   Take 0.5 mg by mouth 3 (three) times daily as needed. anxiety         . IBUPROFEN 800 MG PO TABS   Oral   Take 800 mg by mouth every 8 (eight) hours as needed. Pain/inflammation         . LISINOPRIL 10 MG PO TABS   Oral   Take 10  mg by mouth daily.         Marland Kitchen OMEPRAZOLE 20 MG PO CPDR   Oral   Take 20 mg by mouth 2 (two) times daily.         . OXYCODONE HCL 5 MG PO CAPS   Oral   Take 10 mg by mouth every 4 (four) hours as needed. Breakthrough pain         . OXYCODONE HCL ER 60 MG PO T12A   Oral   Take 1 tablet by mouth 3 (three) times daily.         Marland Kitchen ZOLPIDEM TARTRATE 10 MG PO TABS   Oral   Take 10 mg by mouth at bedtime as needed. sleep           BP 140/79  Pulse 75  Temp 98.9 F (37.2 C) (Oral)  Resp 20  Ht 5\' 4"  (1.626 m)  Wt 185 lb (83.915 kg)  BMI 31.76 kg/m2  SpO2 95%  Physical Exam  Constitutional: He is oriented to person, place, and time. He appears well-developed and well-nourished. No distress.       Standing in room, pacing, appears uncomfortable.  HENT:  Head: Normocephalic and atraumatic.  Eyes: Conjunctivae normal are normal.  Neck: Normal range of motion.  Cardiovascular: Normal rate, regular rhythm, normal heart sounds and intact distal pulses.   Pulmonary/Chest: Effort normal and breath sounds normal.  Musculoskeletal:       Lumbar back: He exhibits decreased range of motion, tenderness and bony tenderness. He exhibits normal pulse.       Back:  Neurological: He is alert and oriented to person, place, and time. No sensory deficit. Gait normal.  Skin: Skin is warm and dry.     Psychiatric: His speech is normal and behavior is normal. His mood appears anxious.    ED Course  Procedures (including critical care time)  Labs Reviewed - No data to display Dg Lumbar Spine Complete  06/05/2012  *RADIOLOGY REPORT*  Clinical Data: Lower back pain, status post fall.  LUMBAR SPINE - COMPLETE 4+ VIEW  Comparison: CT of the abdomen and pelvis performed 03/06/2010  Findings: There is no evidence of fracture or subluxation. Vertebral bodies demonstrate normal height and alignment. Intervertebral disc spaces are preserved.  Mild facet disease is noted along the lower lumbar  spine.  The visualized bowel gas pattern is unremarkable in appearance; air and stool are noted within the colon.  The sacroiliac joints are within normal limits.  Scattered bilateral renal stones are again noted.  IMPRESSION:  1.  No evidence of fracture or subluxation along the lumbar spine. 2.  Scattered bilateral renal stones again seen.   Original Report Authenticated By: Tonia Ghent, M.D.      1. Back contusion       MDM  51 year old male presenting with low-back pain after falling directly onto his back. He is worried that he may of messed up his prior surgery. X-ray normal. He is aware of the kidney stone seen on x-ray. He has appointment with his back doctor on Tuesday. Aware to rest until followup with his doctor. He has pain medicine at home that he can take.        Trevor Mace, PA-C 06/05/12 2301

## 2017-02-14 ENCOUNTER — Emergency Department (HOSPITAL_COMMUNITY)
Admission: EM | Admit: 2017-02-14 | Discharge: 2017-02-14 | Disposition: A | Discharge status: Home / Self Care | Payer: BLUE CROSS/BLUE SHIELD | Attending: Emergency Medicine | Admitting: Emergency Medicine

## 2017-02-14 ENCOUNTER — Encounter (HOSPITAL_COMMUNITY): Payer: Self-pay

## 2017-02-14 DIAGNOSIS — I10 Essential (primary) hypertension: Secondary | ICD-10-CM | POA: Insufficient documentation

## 2017-02-14 DIAGNOSIS — G8929 Other chronic pain: Secondary | ICD-10-CM | POA: Insufficient documentation

## 2017-02-14 DIAGNOSIS — Z79899 Other long term (current) drug therapy: Secondary | ICD-10-CM | POA: Diagnosis not present

## 2017-02-14 DIAGNOSIS — Z791 Long term (current) use of non-steroidal anti-inflammatories (NSAID): Secondary | ICD-10-CM | POA: Insufficient documentation

## 2017-02-14 DIAGNOSIS — F1123 Opioid dependence with withdrawal: Secondary | ICD-10-CM | POA: Insufficient documentation

## 2017-02-14 DIAGNOSIS — F1193 Opioid use, unspecified with withdrawal: Secondary | ICD-10-CM

## 2017-02-14 MED ORDER — SODIUM CHLORIDE 0.9 % IV BOLUS (SEPSIS)
2000.0000 mL | Freq: Once | INTRAVENOUS | Status: AC
  Administered 2017-02-14: 2000 mL via INTRAVENOUS

## 2017-02-14 MED ORDER — CLONIDINE HCL 0.1 MG PO TABS: 0.1000 mg | ORAL_TABLET | Freq: Four times a day (QID) | ORAL | 0 refills | Status: DC | PRN

## 2017-02-14 MED ORDER — ZOLPIDEM TARTRATE 5 MG PO TABS: 5.0000 mg | ORAL_TABLET | Freq: Every evening | ORAL | 0 refills | Status: DC | PRN

## 2017-02-14 MED ORDER — KETOROLAC TROMETHAMINE 15 MG/ML IJ SOLN
15.0000 mg | Freq: Once | INTRAMUSCULAR | Status: AC
  Administered 2017-02-14: 15 mg via INTRAVENOUS
  Filled 2017-02-14: qty 1

## 2017-02-14 MED ORDER — ONDANSETRON 8 MG PO TBDP: 8.0000 mg | ORAL_TABLET | Freq: Three times a day (TID) | ORAL | 0 refills | Status: DC | PRN

## 2017-02-14 MED ORDER — METHOCARBAMOL 500 MG PO TABS
500.0000 mg | ORAL_TABLET | Freq: Once | ORAL | Status: AC
  Administered 2017-02-14: 500 mg via ORAL
  Filled 2017-02-14: qty 1

## 2017-02-14 MED ORDER — CLONIDINE HCL 0.1 MG PO TABS
0.2000 mg | ORAL_TABLET | Freq: Once | ORAL | Status: AC
  Administered 2017-02-14: 0.2 mg via ORAL
  Filled 2017-02-14: qty 2

## 2017-02-14 NOTE — ED Triage Notes (Signed)
Pt wants detox from opiate pain medication, he has been on them since 2009 after an accident and has recently been decreasing his dose He decided he wanted to come off them for good and today is 72hours without any pain meds Pt is very weak and having diarrhea

## 2017-02-14 NOTE — ED Provider Notes (Signed)
[facility]-EMERGENCY DEPT Provider Note   CSN: [id number] Arrival date & time: 02/14/17  0704     History   Chief Complaint Chief Complaint  Patient presents with  . detox    HPI [name] [name] is a 56 y.o. male.  HPI Patient is a 56 year old male with a long-standing history of chronic back pain for which she is prescribed opioid pain medications.  He's been on these for years and has decided recently that he no longer wants to be on his medications.  He is slowly tapered himself over the past several weeks and 72 hours ago stopped completely.  He presents emergency department with diarrhea over the past 24 hours of abdominal cramping and is requesting IV fluids.  He seems very focused on stopping his pain medication altogether and has the support of his wife.  He has a team to continue to help with his symptoms including physical therapy and a chiropracticspecialist.  No lightheadedness.  He reportsfeeling generally weak.symptoms are moderate in severity   Past Medical History:  Diagnosis Date  . Hypertension     There are no active problems to display for this patient.   Past Surgical History:  Procedure Laterality Date  . BACK SURGERY     bulging disc repair  . HAND SURGERY    . KNEE ARTHROSCOPY    . LITHOTRIPSY         Home Medications    Prior to Admission medications   Medication Sig Start Date End Date Taking? Authorizing Provider  ALPRAZolam Prudy Feeler) 0.5 MG tablet Take 0.5 mg by mouth 3 (three) times daily as needed. anxiety    [provider]  ibuprofen (ADVIL,MOTRIN) 800 MG tablet Take 800 mg by mouth every 8 (eight) hours as needed. Pain/inflammation    [provider]  lisinopril (PRINIVIL,ZESTRIL) 10 MG tablet Take 10 mg by mouth daily.    [provider]  omeprazole (PRILOSEC) 20 MG capsule Take 20 mg by mouth 2 (two) times daily.    [provider]  oxycodone (OXY-IR) 5 MG capsule Take 10 mg by mouth every 4 (four) hours as  needed. Breakthrough pain    [provider]  oxycodone (ROXICODONE) 30 MG immediate release tablet Take 30 mg by mouth 2 (two) times daily as needed for pain. 02/05/17   [provider]  OxyCODONE HCl ER 60 MG T12A Take 1 tablet by mouth 3 (three) times daily.    [provider]  zolpidem (AMBIEN) 10 MG tablet Take 10 mg by mouth at bedtime as needed. sleep    [provider]    Family History History reviewed. No pertinent family history.  Social History Social History  Substance Use Topics  . Smoking status: Never Smoker  . Smokeless tobacco: Never Used  . Alcohol use No     Allergies   Compazine [prochlorperazine edisylate]   Review of Systems Review of Systems  All other systems reviewed and are negative.    Physical Exam Updated Vital Signs BP (!) 151/106 (BP Location: Left Arm)   Pulse 93   Temp 97.7 F (36.5 C) (Oral)   Resp 18   SpO2 96%   Physical Exam  Constitutional: He is oriented to person, place, and time. He appears well-developed and well-nourished.  HENT:  Head: Normocephalic and atraumatic.  Eyes: EOM are normal.  Neck: Normal range of motion.  Cardiovascular: Normal rate, regular rhythm, normal heart sounds and intact distal pulses.   Pulmonary/Chest: Effort normal and  breath sounds normal. No respiratory distress.  Abdominal: Soft. He exhibits no distension. There is no tenderness.  Musculoskeletal: Normal range of motion.  Neurological: He is alert and oriented to person, place, and time.  Skin: Skin is warm and dry.  Psychiatric: He has a normal mood and affect. Judgment normal.  Nursing note and vitals reviewed.    ED Treatments / Results  Labs (all labs ordered are listed, but only abnormal results are displayed) Labs Reviewed - No data to display  EKG  EKG Interpretation None       Radiology No results found.  Procedures Procedures (including critical care time)  Medications Ordered in  ED Medications  sodium chloride 0.9 % bolus 2,000 mL (not administered)  ketorolac (TORADOL) 15 MG/ML injection 15 mg (not administered)  cloNIDine (CATAPRES) tablet 0.2 mg (not administered)  methocarbamol (ROBAXIN) tablet 500 mg (not administered)     Initial Impression / Assessment and Plan / ED Course  I have reviewed the triage vital signs and the nursing notes.  Pertinent labs & imaging results that were available during my care of the patient were reviewed by me and considered in my medical decision making (see chart for details).     10:28 AM Patient feels much better at this time.  Discharge home in good condition.  Primary care follow-up.  Home with standard withdrawal medications  Final Clinical Impressions(s) / ED Diagnoses   Final diagnoses:  Opioid withdrawal [facility](HCC)    New Prescriptions New Prescriptions   No medications on file     Azalia Bilisampos, Azir Muzyka, MD 02/14/17 1028

## 2018-04-13 ENCOUNTER — Emergency Department (HOSPITAL_COMMUNITY): Payer: BLUE CROSS/BLUE SHIELD

## 2018-04-13 ENCOUNTER — Other Ambulatory Visit: Payer: Self-pay

## 2018-04-13 ENCOUNTER — Encounter (HOSPITAL_COMMUNITY): Payer: Self-pay | Admitting: Emergency Medicine

## 2018-04-13 ENCOUNTER — Emergency Department (HOSPITAL_COMMUNITY)
Admission: EM | Admit: 2018-04-13 | Discharge: 2018-04-13 | Disposition: A | Discharge status: Home / Self Care | Payer: BLUE CROSS/BLUE SHIELD | Attending: Emergency Medicine | Admitting: Emergency Medicine

## 2018-04-13 DIAGNOSIS — R1031 Right lower quadrant pain: Secondary | ICD-10-CM | POA: Diagnosis present

## 2018-04-13 DIAGNOSIS — N200 Calculus of kidney: Secondary | ICD-10-CM | POA: Diagnosis not present

## 2018-04-13 DIAGNOSIS — I10 Essential (primary) hypertension: Secondary | ICD-10-CM | POA: Insufficient documentation

## 2018-04-13 LAB — URINALYSIS, ROUTINE W REFLEX MICROSCOPIC
BILIRUBIN URINE: NEGATIVE
Bacteria, UA: NONE SEEN
Glucose, UA: NEGATIVE mg/dL
Ketones, ur: NEGATIVE mg/dL
Leukocytes, UA: NEGATIVE
NITRITE: NEGATIVE
Protein, ur: 30 mg/dL — AB
Specific Gravity, Urine: 1.015 (ref 1.005–1.030)
pH: 6 (ref 5.0–8.0)

## 2018-04-13 LAB — BASIC METABOLIC PANEL
Anion gap: 12 (ref 5–15)
BUN: 13 mg/dL (ref 6–20)
CALCIUM: 9.3 mg/dL (ref 8.9–10.3)
CO2: 22 mmol/L (ref 22–32)
CREATININE: 1.01 mg/dL (ref 0.61–1.24)
Chloride: 104 mmol/L (ref 98–111)
GFR calc Af Amer: >60 mL/min (ref >60)
GFR calc non Af Amer: >60 mL/min (ref >60)
GLUCOSE: 141 mg/dL — AB (ref 70–99)
Potassium: 3.7 mmol/L (ref 3.5–5.1)
SODIUM: 138 mmol/L (ref 135–145)

## 2018-04-13 LAB — CBC
HCT: 47 % (ref 39.0–52.0)
Hemoglobin: 15.4 g/dL (ref 13.0–17.0)
MCH: 30.4 pg (ref 26.0–34.0)
MCHC: 32.8 g/dL (ref 30.0–36.0)
MCV: 92.9 fL (ref 80.0–100.0)
PLATELETS: 251 10*3/uL (ref 150–400)
RBC: 5.06 MIL/uL (ref 4.22–5.81)
RDW: 13.3 % (ref 11.5–15.5)
WBC: 10.8 10*3/uL — ABNORMAL HIGH (ref 4.0–10.5)
nRBC: 0 % (ref 0.0–0.2)

## 2018-04-13 MED ORDER — HYDROCODONE-ACETAMINOPHEN 5-325 MG PO TABS: 2.0000 | ORAL_TABLET | Freq: Four times a day (QID) | ORAL | 0 refills | Status: DC | PRN

## 2018-04-13 MED ORDER — HYDROMORPHONE HCL 1 MG/ML IJ SOLN
1.0000 mg | Freq: Once | INTRAMUSCULAR | Status: AC
  Administered 2018-04-13: 1 mg via INTRAVENOUS
  Filled 2018-04-13: qty 1

## 2018-04-13 MED ORDER — ONDANSETRON HCL 4 MG/2ML IJ SOLN
4.0000 mg | Freq: Once | INTRAMUSCULAR | Status: AC
  Administered 2018-04-13: 4 mg via INTRAVENOUS
  Filled 2018-04-13: qty 2

## 2018-04-13 MED ORDER — HYDROCODONE-ACETAMINOPHEN 5-325 MG PO TABS
2.0000 | ORAL_TABLET | Freq: Once | ORAL | Status: AC
  Administered 2018-04-13: 2 via ORAL
  Filled 2018-04-13: qty 2

## 2018-04-13 MED ORDER — KETOROLAC TROMETHAMINE 30 MG/ML IJ SOLN
30.0000 mg | Freq: Once | INTRAMUSCULAR | Status: AC
  Administered 2018-04-13: 30 mg via INTRAVENOUS
  Filled 2018-04-13: qty 1

## 2018-04-13 MED ORDER — TAMSULOSIN HCL 0.4 MG PO CAPS: 0.4000 mg | ORAL_CAPSULE | Freq: Two times a day (BID) | ORAL | 0 refills | Status: AC

## 2018-04-13 NOTE — ED Triage Notes (Deleted)
Patient complaining of having trouble urinating and having pain in lower abdomin. Patient walked in without any difficulty. Patient also state he is having trouble urinating. Patient is not having any burning while urinating. 

## 2018-04-13 NOTE — ED Provider Notes (Signed)
Took patient care handoff report from [employer], [place]. Plan: Patient with renal colic.  Sure pain is reasonably controlled and discharge.   HPI from Ivar Drape, PA-C: "Patient presents to the emergency department with chief complaint of right flank pain.  He reports onset of symptoms tonight at 2:30 AM.  He states that it radiates to his right groin.  Reports some urinary hesitancy.  He reports a history of kidney stones.  He states that this feels the same.  He rates his pain is severe.  He denies any aggravating or alleviating factors.  He has required lithotripsy and ureteral stents in the past."  Abnormal Labs Reviewed  URINALYSIS, ROUTINE W REFLEX MICROSCOPIC - Abnormal; Notable for the following components:      Result Value   Hgb urine dipstick LARGE (*)    Protein, ur 30 (*)    RBC / HPF >50 (*)    All other components within normal limits  BASIC METABOLIC PANEL - Abnormal; Notable for the following components:   Glucose, Bld 141 (*)    All other components within normal limits  CBC - Abnormal; Notable for the following components:   WBC 10.8 (*)    All other components within normal limits    Ct Renal Stone Study  Result Date: 04/13/2018 CLINICAL DATA:  Right flank pain radiating to the right lower quadrant since 02/30 in the morning. Nausea and vomiting. EXAM: CT ABDOMEN AND PELVIS WITHOUT CONTRAST TECHNIQUE: Multidetector CT imaging of the abdomen and pelvis was performed following the standard protocol without IV contrast. COMPARISON:  03/06/2010 FINDINGS: Lower chest: Lung bases are clear. Hepatobiliary: No focal liver abnormality is seen. No gallstones, gallbladder wall thickening, or biliary dilatation. Pancreas: Unremarkable. No pancreatic ductal dilatation or surrounding inflammatory changes. Spleen: Normal in size without focal abnormality. Adrenals/Urinary Tract: No adrenal gland nodules. Multiple stones in both kidneys. Largest on the right lower pole is about 7 mm  in diameter. Mild right hydronephrosis and hydroureter. 6 mm stone in the right ureterovesical junction. Left collecting system is decompressed. Bladder wall is not thickened. Stomach/Bowel: Stomach is within normal limits. Appendix appears normal. No evidence of bowel wall thickening, distention, or inflammatory changes. Vascular/Lymphatic: No significant vascular findings are present. No enlarged abdominal or pelvic lymph nodes. Reproductive: Prostate is unremarkable. Other: No free air or free fluid in the abdomen. Left inguinal hernia containing fat. Musculoskeletal: Degenerative changes in the spine. No destructive bone lesions. IMPRESSION: Multiple bilateral nonobstructing intrarenal stones. 6 mm stone in the right ureterovesical junction with mild proximal obstruction. Left inguinal hernia containing fat. Electronically Signed   By: Burman Nieves M.D.   On: 04/13/2018 06:39     Patient's pain adequately controlled. Did not require further pain management after I took over care for patient.  Patient reportedly told RN he was ready for discharge.  Patient was gone from the room before I could speak with or examine him.   Vitals:   04/13/18 0409 04/13/18 0421 04/13/18 0607 04/13/18 0701  BP:  (!) 143/88 137/90 127/80  Pulse:  75 75 67  Resp:  16 19   Temp:  98 F (36.7 C) 98.7 F (37.1 C)   TempSrc:  Oral Oral   SpO2:  96% 96% 96%  Weight: 99.8 kg 90.7 kg    Height: 5\' 8"  (1.727 m) 5\' 4"  (1.626 m)        Anselm Pancoast, PA-C 04/13/18 0725    Palumbo, April, MD 04/13/18 2323

## 2018-04-13 NOTE — ED Triage Notes (Signed)
Patient is complaining of right flank pain radiating to right groin. Patient states the pain woke him up. Patient has been nauseas and vomiting. Patient states that he thinks it is kidney stone.

## 2018-04-13 NOTE — ED Provider Notes (Signed)
[facility]-EMERGENCY DEPT Provider Note   CSN: [id number] Arrival date & time: 04/13/18  0349     History   Chief Complaint Chief Complaint  Patient presents with  . Flank Pain    HPI [name] [name] is a 57 y.o. male.  Patient presents to the emergency department with chief complaint of right flank pain.  He reports onset of symptoms tonight at 2:30 AM.  He states that it radiates to his right groin.  Reports some urinary hesitancy.  He reports a history of kidney stones.  He states that this feels the same.  He rates his pain is severe.  He denies any aggravating or alleviating factors.  He has required lithotripsy and ureteral stents in the past.  The history is provided by the patient. No language interpreter was used.    Past Medical History:  Diagnosis Date  . Hypertension     There are no active problems to display for this patient.   Past Surgical History:  Procedure Laterality Date  . BACK SURGERY     bulging disc repair  . HAND SURGERY    . KNEE ARTHROSCOPY    . LITHOTRIPSY          Home Medications    Prior to Admission medications   Medication Sig Start Date End Date Taking? Authorizing Provider  cloNIDine (CATAPRES) 0.1 MG tablet Take 1 tablet (0.1 mg total) by mouth every 6 (six) hours as needed. 02/14/17   Azalia Bilis, MD  ibuprofen (ADVIL,MOTRIN) 200 MG tablet Take 600 mg by mouth every 8 (eight) hours as needed for mild pain. Pain/inflammation     [provider]  ondansetron (ZOFRAN ODT) 8 MG disintegrating tablet Take 1 tablet (8 mg total) by mouth every 8 (eight) hours as needed for nausea or vomiting. 02/14/17   Azalia Bilis, MD  OVER THE COUNTER MEDICATION Apply 1 application topically daily as needed (pain). "mg12 cream"    [provider]  oxycodone (ROXICODONE) 30 MG immediate release tablet Take 30 mg by mouth 2 (two) times daily as needed for pain. 02/05/17   [provider]  zolpidem (AMBIEN)  5 MG tablet Take 1 tablet (5 mg total) by mouth at bedtime as needed for sleep. 02/14/17   Azalia Bilis, MD    Family History History reviewed. No pertinent family history.  Social History Social History   Tobacco Use  . Smoking status: Never Smoker  . Smokeless tobacco: Never Used  Substance Use Topics  . Alcohol use: No  . Drug use: No     Allergies   Compazine [prochlorperazine edisylate] and Prochlorperazine   Review of Systems Review of Systems  All other systems reviewed and are negative.    Physical Exam Updated Vital Signs BP (!) 143/88 (BP Location: Right Arm)   Pulse 75   Temp 98 F (36.7 C) (Oral)   Resp 16   Ht 5\' 4"  (1.626 m)   Wt 90.7 kg   SpO2 96%   BMI 34.33 kg/m   Physical Exam  Constitutional: He is oriented to person, place, and time. He appears well-developed and well-nourished.  HENT:  Head: Normocephalic and atraumatic.  Eyes: Pupils are equal, round, and reactive to light. Conjunctivae and EOM are normal. Right eye exhibits no discharge. Left eye exhibits no discharge. No scleral icterus.  Neck: Normal range of motion. Neck supple. No JVD present.  Cardiovascular: Normal rate, regular rhythm and normal heart sounds. Exam reveals no gallop and  no friction rub.  No murmur heard. Pulmonary/Chest: Effort normal and breath sounds normal. No respiratory distress. He has no wheezes. He has no rales. He exhibits no tenderness.  Abdominal: Soft. He exhibits no distension and no mass. There is no tenderness. There is no rebound and no guarding.  Musculoskeletal: Normal range of motion. He exhibits no edema or tenderness.  Neurological: He is alert and oriented to person, place, and time.  Skin: Skin is warm and dry.  Psychiatric: He has a normal mood and affect. His behavior is normal. Judgment and thought content normal.  Nursing note and vitals reviewed.    ED Treatments / Results  Labs (all labs ordered are listed, but only abnormal results  are displayed) Labs Reviewed  URINALYSIS, ROUTINE W REFLEX MICROSCOPIC - Abnormal; Notable for the following components:      Result Value   Hgb urine dipstick LARGE (*)    Protein, ur 30 (*)    RBC / HPF >50 (*)    All other components within normal limits  BASIC METABOLIC PANEL - Abnormal; Notable for the following components:   Glucose, Bld 141 (*)    All other components within normal limits  CBC - Abnormal; Notable for the following components:   WBC 10.8 (*)    All other components within normal limits    EKG None  Radiology Ct Renal Stone Study  Result Date: 04/13/2018 CLINICAL DATA:  Right flank pain radiating to the right lower quadrant since 02/30 in the morning. Nausea and vomiting. EXAM: CT ABDOMEN AND PELVIS WITHOUT CONTRAST TECHNIQUE: Multidetector CT imaging of the abdomen and pelvis was performed following the standard protocol without IV contrast. COMPARISON:  03/06/2010 FINDINGS: Lower chest: Lung bases are clear. Hepatobiliary: No focal liver abnormality is seen. No gallstones, gallbladder wall thickening, or biliary dilatation. Pancreas: Unremarkable. No pancreatic ductal dilatation or surrounding inflammatory changes. Spleen: Normal in size without focal abnormality. Adrenals/Urinary Tract: No adrenal gland nodules. Multiple stones in both kidneys. Largest on the right lower pole is about 7 mm in diameter. Mild right hydronephrosis and hydroureter. 6 mm stone in the right ureterovesical junction. Left collecting system is decompressed. Bladder wall is not thickened. Stomach/Bowel: Stomach is within normal limits. Appendix appears normal. No evidence of bowel wall thickening, distention, or inflammatory changes. Vascular/Lymphatic: No significant vascular findings are present. No enlarged abdominal or pelvic lymph nodes. Reproductive: Prostate is unremarkable. Other: No free air or free fluid in the abdomen. Left inguinal hernia containing fat. Musculoskeletal: Degenerative  changes in the spine. No destructive bone lesions. IMPRESSION: Multiple bilateral nonobstructing intrarenal stones. 6 mm stone in the right ureterovesical junction with mild proximal obstruction. Left inguinal hernia containing fat. Electronically Signed   By: Burman Nieves M.D.   On: 04/13/2018 06:39    Procedures Procedures (including critical care time)  Medications Ordered in ED Medications  HYDROcodone-acetaminophen (NORCO/VICODIN) 5-325 MG per tablet 2 tablet (has no administration in time range)  HYDROmorphone (DILAUDID) injection 1 mg (1 mg Intravenous Given 04/13/18 0534)  ondansetron (ZOFRAN) injection 4 mg (4 mg Intravenous Given 04/13/18 0534)  ketorolac (TORADOL) 30 MG/ML injection 30 mg (30 mg Intravenous Given 04/13/18 1610)     Initial Impression / Assessment and Plan / ED Course  I have reviewed the triage vital signs and the nursing notes.  Pertinent labs & imaging results that were available during my care of the patient were reviewed by me and considered in my medical decision making (see chart for details).  Patient with past medical history remarkable for kidney stones.  Presents with right flank pain.  Onset in the middle of the night last night.  Reports associated nausea and vomiting.  Denies any fevers.  CT shows multiple stones, but the largest 6 mm.  Patient will follow-up with Dr. Annabell Howells from urology.  Final Clinical Impressions(s) / ED Diagnoses   Final diagnoses:  Kidney stone    ED Discharge Orders         Ordered    HYDROcodone-acetaminophen (NORCO/VICODIN) 5-325 MG tablet  Every 6 hours PRN     04/13/18 0648    tamsulosin (FLOMAX) 0.4 MG CAPS capsule  2 times daily     04/13/18 0648           Roxy Horseman, PA-C 04/13/18 0981    Palumbo, April, MD 04/13/18 0700

## 2020-06-13 IMAGING — CT CT RENAL STONE PROTOCOL
2 of 4 series · 17 of 46 positions shown, 19 images · non-contrast
Comparison: 03/06/2010

CLINICAL DATA: Right flank pain radiating to the right lower
quadrant since [DATE] in the morning. Nausea and vomiting.

EXAM:
CT ABDOMEN AND PELVIS WITHOUT CONTRAST
TECHNIQUE: Multidetector CT imaging of the abdomen and pelvis was performed
following the standard protocol without IV contrast.

[Series 2: axial st · axial · 0.81mm/px · z∈[-484,-104]mm · 14 of 85 slices shown, 16 images]
[im 5/85  soft-tissue]
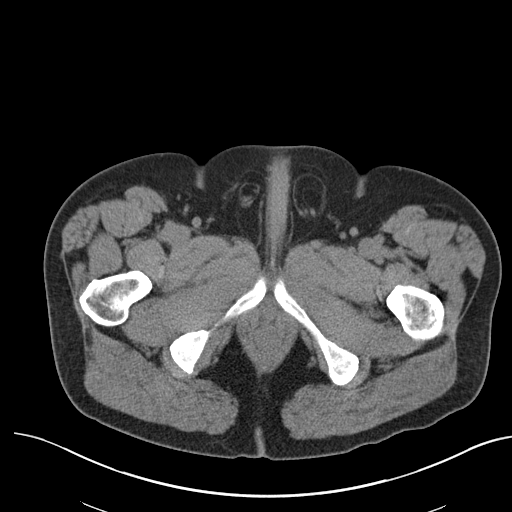
[im 5/85  bone]
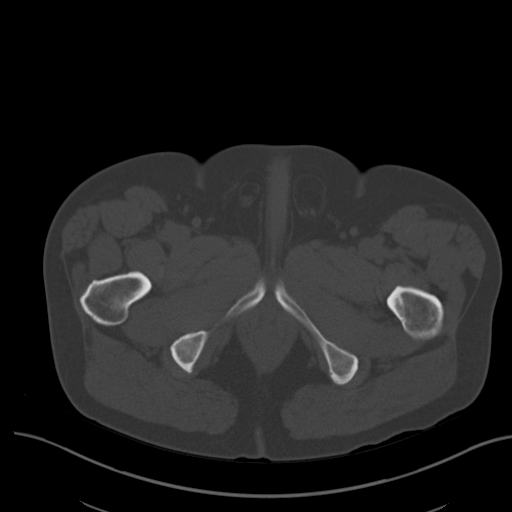
[im 13/85  soft-tissue]
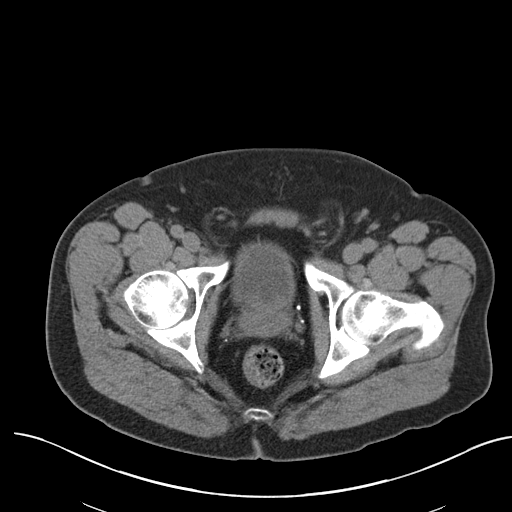
[im 17/85  soft-tissue]
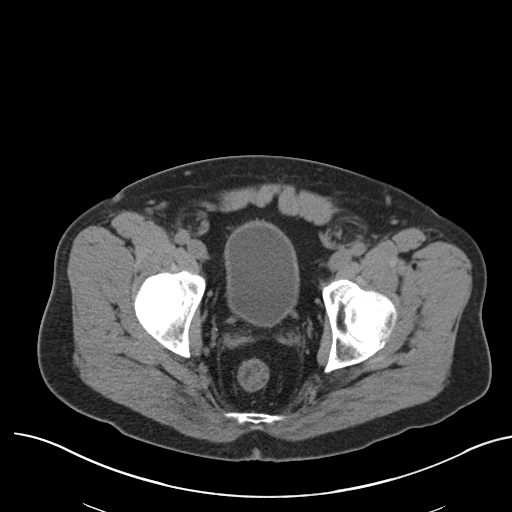
[im 25/85  soft-tissue]
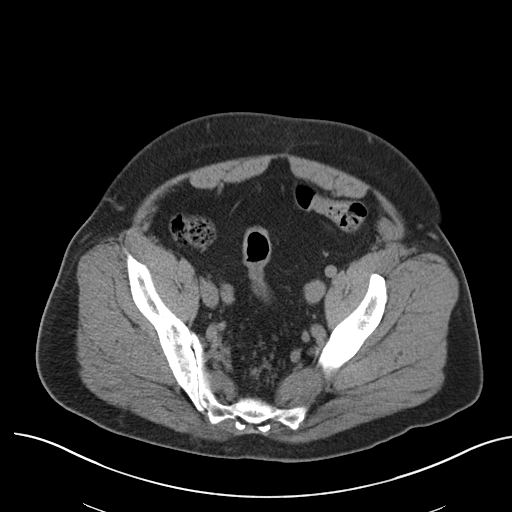
[im 29/85  soft-tissue]
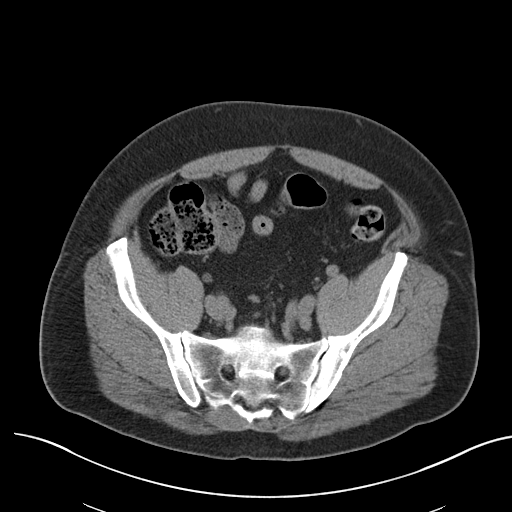
[im 33/85  soft-tissue]
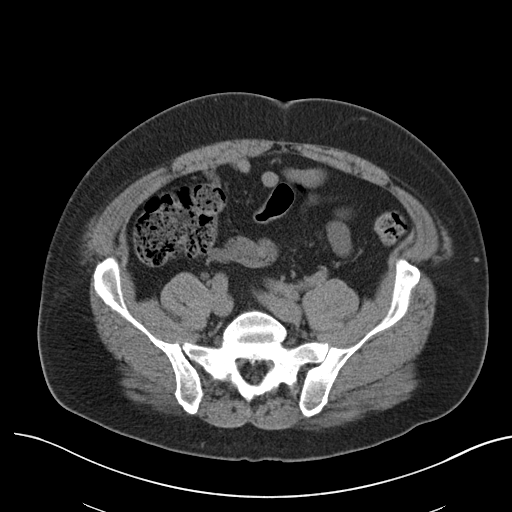
[im 41/85  soft-tissue]
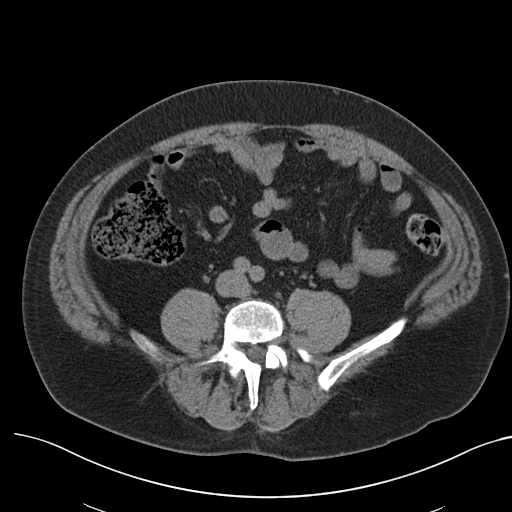
[im 45/85  soft-tissue]
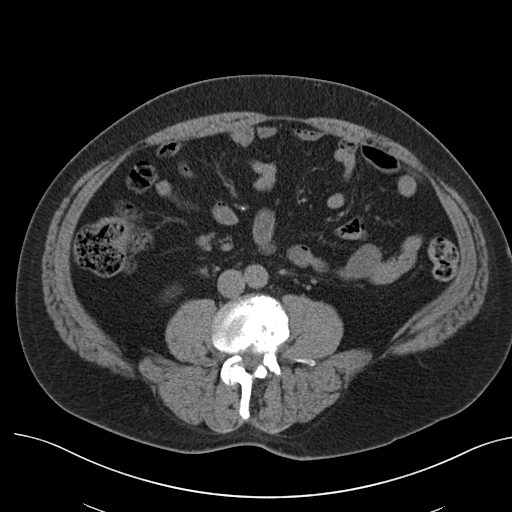
[im 53/85  soft-tissue]
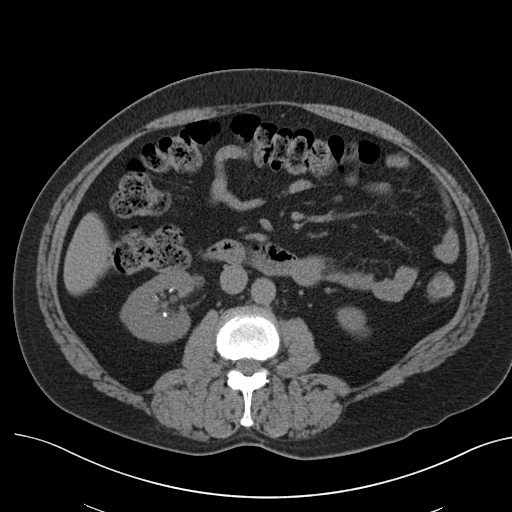
[im 53/85  bone]
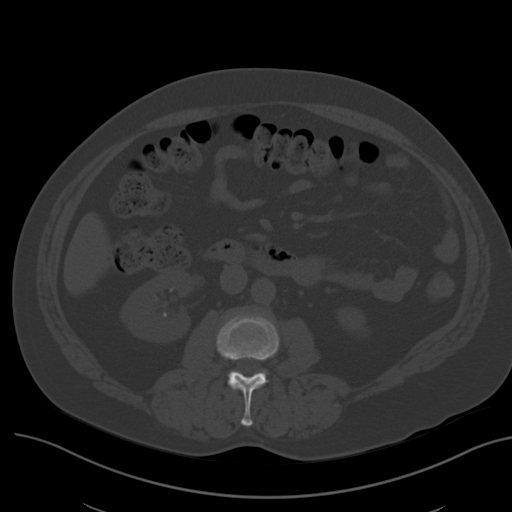
[im 57/85  soft-tissue]
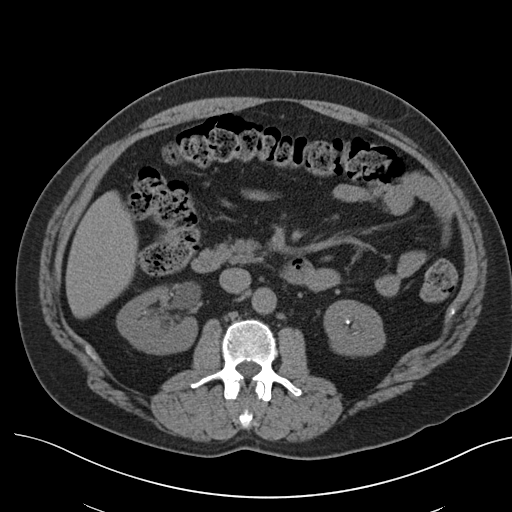
[im 65/85  soft-tissue]
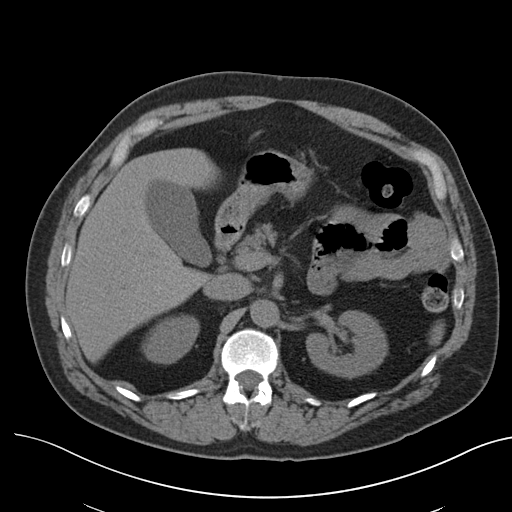
[im 69/85  soft-tissue]
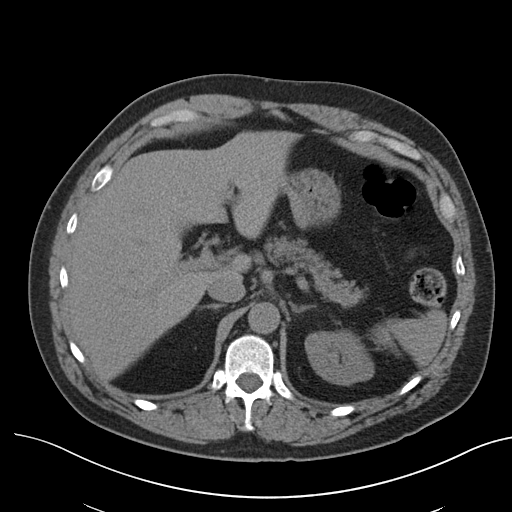
[im 73/85  soft-tissue]
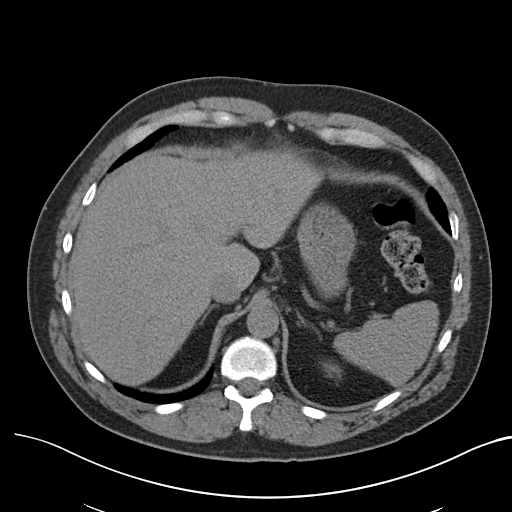
[im 81/85  soft-tissue]
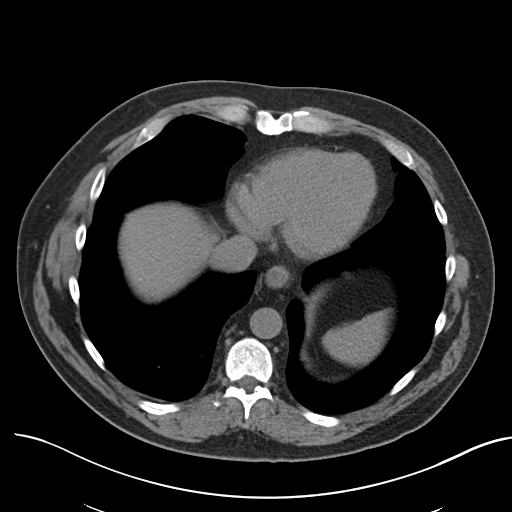

[Series 4: coronal · coronal · 0.80mm/px · 3 of 146 slices shown]
[im 49/146  soft-tissue]
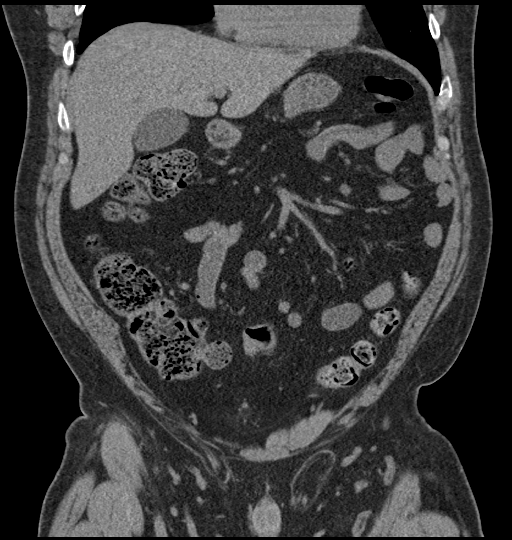
[im 65/146  soft-tissue]
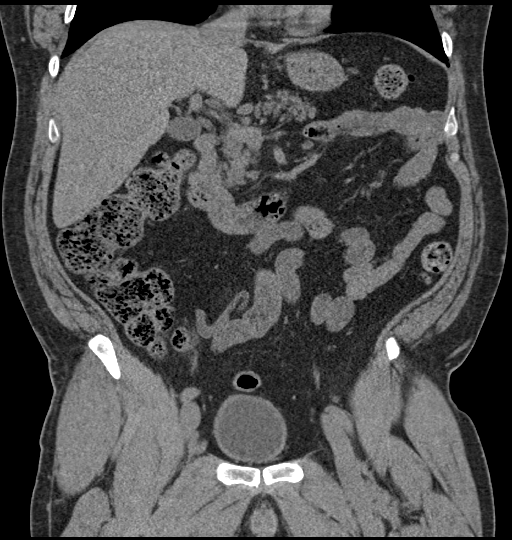
[im 81/146  soft-tissue]
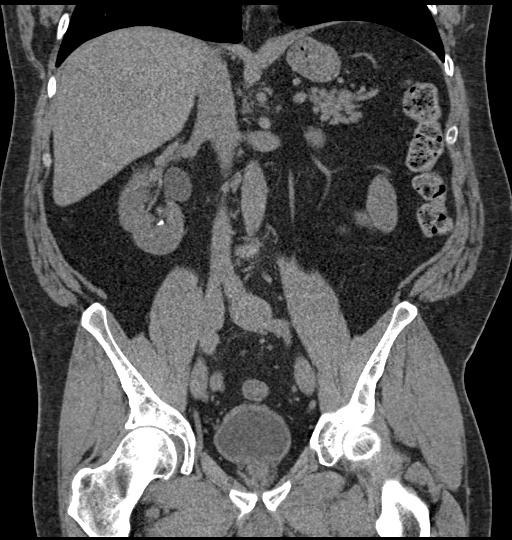

[17 of 46 positions shown; findings below may reference images not displayed]

FINDINGS: Lower chest: Lung bases are clear.

Hepatobiliary: No focal liver abnormality is seen. No gallstones,
gallbladder wall thickening, or biliary dilatation.

Pancreas: Unremarkable. No pancreatic ductal dilatation or
surrounding inflammatory changes.

Spleen: Normal in size without focal abnormality.

Adrenals/Urinary Tract: No adrenal gland nodules. Multiple stones in
both kidneys. Largest on the right lower pole is about 7 mm in
diameter. Mild right hydronephrosis and hydroureter. 6 mm stone in
the right ureterovesical junction. Left collecting system is
decompressed. Bladder wall is not thickened.

Stomach/Bowel: Stomach is within normal limits. Appendix appears
normal. No evidence of bowel wall thickening, distention, or
inflammatory changes.

Vascular/Lymphatic: No significant vascular findings are present. No
enlarged abdominal or pelvic lymph nodes.

Reproductive: Prostate is unremarkable.

Other: No free air or free fluid in the abdomen. Left inguinal
hernia containing fat.

Musculoskeletal: Degenerative changes in the spine. No destructive
bone lesions.
IMPRESSION: Multiple bilateral nonobstructing intrarenal stones. 6 mm stone in
the right ureterovesical junction with mild proximal obstruction.
Left inguinal hernia containing fat.

## 2022-12-06 ENCOUNTER — Encounter: Payer: Self-pay | Admitting: *Deleted

## 2022-12-06 NOTE — Progress Notes (Signed)
PATIENT NAVIGATOR PROGRESS NOTE  Name: [name] [name] Date: 12/06/2022 MRN: [id number]  DOB: [birth year]   Reason for visit:  New pt appt  Comments:  Called and spoke with pt and scheduled him with Lonna Cobb NP 12/07/22 at 11:15 Reviewed directions to building and parking. Given contact information to call with any issues or questions    Time spent counseling/coordinating care: 30-45 minutes

## 2022-12-06 NOTE — Progress Notes (Signed)
New Hematology/Oncology Consult   Requesting MD: Dr. Mirian Mo  [phone]  Reason for Consult: Colon cancer  HPI: [name] [name] is a 62 year old man referred for evaluation following a recent diagnosis of colon cancer.  He underwent a colonoscopy on 10/10/2022 for history of colon polyps and iron deficiency anemia.  He was found to have a circumferential fungating mass thought to represent the proximal descending colon at 70 cm from the anus that could not be passed with the scope.  Biopsy showed adenocarcinoma, at least intramucosal; intact expression of all 4 mismatch repair proteins.    CT scans 10/17/2022 showed 2 noncalcified nodules adjacent to the horizontal fissure within the right thorax between the right upper lobe and right middle lobe measuring 11 and 7 mm in size; no other pulmonary nodules; liver was negative for suspicious findings; spleen with 2 small densities; a constricting lesion of the distal transverse colon suspected seen to the left of midline adjacent to the splenic flexure measuring 3.6 cm in length; significant narrowing of the lumen of the bowel at this location with bowel wall thickening; hazy stranding within the adjacent mesenteric fat; along the superior margin of the lesion and exophytic 12 mm density possibly representing local invasion and/or adjacent adenopathy.    CEA on 10/17/2022 3.0 (normal range for non-smokers less than 3.0, smokers less than 5.0).  He underwent laparoscopic extended right hemicolectomy 11/05/2022 by Dr. Byrd Hesselbach.  Final pathology showed 6 cm invasive moderately differentiated adenocarcinoma in the distal transverse colon, invading the visceral peritoneum; resection margins negative; positive lymphovascular and perineural invasion; 1 out of 26 lymph nodes positive for carcinoma; no tumor deposits identified; appendiceal serrated lesion with low-grade dysplasia; pT4a, pN1a.    Past Medical History:  Diagnosis Date   Hypertension      Past  Surgical History:  Procedure Laterality Date   BACK SURGERY     bulging disc repair   HAND SURGERY     KNEE ARTHROSCOPY     LITHOTRIPSY       Current Outpatient Medications:    ibuprofen (ADVIL,MOTRIN) 200 MG tablet, Take 600 mg by mouth every 8 (eight) hours as needed for mild pain. Pain/inflammation , Disp: , Rfl:    tamsulosin (FLOMAX) 0.4 MG CAPS capsule, Take 1 capsule (0.4 mg total) by mouth 2 (two) times daily., Disp: 10 capsule, Rfl: 0   cloNIDine (CATAPRES) 0.1 MG tablet, Take 1 tablet (0.1 mg total) by mouth every 6 (six) hours as needed. (Patient not taking: Reported on 12/07/2022), Disp: 15 tablet, Rfl: 0   HYDROcodone-acetaminophen (NORCO/VICODIN) 5-325 MG tablet, Take 2 tablets by mouth every 6 (six) hours as needed. (Patient not taking: Reported on 12/07/2022), Disp: 10 tablet, Rfl: 0   ondansetron (ZOFRAN ODT) 8 MG disintegrating tablet, Take 1 tablet (8 mg total) by mouth every 8 (eight) hours as needed for nausea or vomiting. (Patient not taking: Reported on 12/07/2022), Disp: 10 tablet, Rfl: 0   OVER THE COUNTER MEDICATION, Apply 1 application topically daily as needed (pain). "mg12 cream" (Patient not taking: Reported on 12/07/2022), Disp: , Rfl:    oxycodone (ROXICODONE) 30 MG immediate release tablet, Take 30 mg by mouth 2 (two) times daily as needed for pain. (Patient not taking: Reported on 12/07/2022), Disp: , Rfl:    zolpidem (AMBIEN) 5 MG tablet, Take 1 tablet (5 mg total) by mouth at bedtime as needed for sleep. (Patient not taking: Reported on 12/07/2022), Disp: 7 tablet, Rfl: 0:     Allergies  Allergen  Reactions   Compazine [Prochlorperazine Edisylate] Anaphylaxis and Swelling    Tongue swelling   Prochlorperazine Other (See Comments)    Trouble swallowing.     FH: Mother deceased COPD; father deceased age 51 dementia, had history of penile cancer; brother deceased stroke; father deceased motor vehicle accident; 3 sisters living in good health; maternal grandfather  deceased lung cancer.  SOCIAL HISTORY: He lives with his wife in [place].  He has 2 healthy children.  He works in [employer].  No tobacco use.  He drinks 6-8 beers over the course of a week.  Review of Systems: No fevers or sweats.  He has a good appetite.  Had some weight loss after surgery.  Bowels moving regularly.  No bloody or black stools.  He denies nausea/vomiting.  No dysphagia.  No shortness of breath.  No cough.  No chest pain.  No urinary symptoms.  History of kidney stones.  No numbness or tingling in the hands or feet. He reports receiving a dose of IV iron before surgery.  He tolerated well.    Physical Exam:  Blood pressure (!) 150/86, pulse 76, temperature 98.2 F (36.8 C), temperature source Oral, resp. rate 18, height 5\' 4"  (1.626 m), weight 189 lb 9.6 oz (86 kg), SpO2 99 %.  Lungs: Lungs clear bilaterally. Cardiac: Regular rate and rhythm. Abdomen: No hepatosplenomegaly.  Healed surgical incisions. Vascular: No leg edema. Lymph nodes: No palpable cervical, supraclavicular, axillary or inguinal lymph nodes. Neurologic: Alert and oriented.  Gait normal. Skin: No rash.  LABS:  No results for input(s): "WBC", "HGB", "HCT", "PLT" in the last 72 hours.  No results for input(s): "NA", "K", "CL", "CO2", "GLUCOSE", "BUN", "CREATININE", "CALCIUM" in the last 72 hours.    RADIOLOGY:  No results found.  Assessment and Plan:   Colon cancer, stage IIIb (pT4a, pN1a) Colonoscopy 10/10/2022 -circumferential fungating mass thought to represent the proximal descending colon at 70 cm from the anus that could not be passed with the scope.  Biopsy showed adenocarcinoma, at least intramucosal; intact expression of all 4 mismatch repair proteins.   CT scans 10/17/2022-2 noncalcified nodules adjacent to the horizontal fissure within the right thorax between the right upper lobe and right middle lobe measuring 11 and 7 mm in size; no other pulmonary nodules; 10 mm right  paratracheal lymph node liver was negative for suspicious findings; spleen with 2 small densities; a constricting lesion of the distal transverse colon suspected seen to the left of midline adjacent to the splenic flexure measuring 3.6 cm in length; significant narrowing of the lumen of the bowel at this location with bowel wall thickening; hazy stranding within the adjacent mesenteric fat; along the superior margin of the lesion and exophytic 12 mm density possibly representing local invasion and/or adjacent adenopathy.   CEA 10/17/2022 3.0 (normal range for non-smokers less than 3.0, smokers less than 5.0) Laparoscopic extended right hemicolectomy 11/05/2022 by Dr. Byrd Hesselbach.  Final pathology showed 6 cm invasive moderately differentiated adenocarcinoma in the distal transverse colon, invading the visceral peritoneum; resection margins negative; positive lymphovascular and perineural invasion; 1 out of 26 lymph nodes positive for carcinoma; no tumor deposits identified; appendiceal serrated lesion with low-grade dysplasia; pT4a, pN1a.  Iron deficiency anemia-he reports receiving IV iron prior to the colon surgery Hypertension History of kidney stones Colonoscopy 10/10/2022-mass could not be passed with the scope  [name] [name] has been diagnosed with stage III colon cancer.  Dr. Truett Perna reviewed the diagnosis, prognosis and recommended treatment with  him and his wife at today's visit.  They understand he has a significant chance of developing recurrent colon cancer in the next several years.  They understand the recommendation for adjuvant chemotherapy.  We reviewed the FOLFOX regimen.  We discussed potential toxicities associated with chemotherapy including bone marrow toxicity, nausea, hair loss, allergic reaction.  We reviewed the various forms of neuropathy associated with oxaliplatin.  We discussed potential side effects associated with 5-fluorouracil including mouth sores, diarrhea, hand-foot syndrome,  increased sensitivity to sun, skin hyperpigmentation, skin rash.  He agrees to proceed.  He will attend a chemotherapy education class.    He understands a Port-A-Cath is required for this regimen.  Referral placed to interventional radiology.  [name] [name] does not appear to have hereditary nonpolyposis colon cancer syndrome but his family numbers are at increased risk of developing colorectal cancer and should receive appropriate screening.  We have requested MSI testing.  He is going on a planned vacation next week.  He will return for follow-up and cycle 1 FOLFOX 12/19/2022.  Patient seen with Dr. Truett Perna.    Lonna Cobb, NP 12/07/2022, 12:06 PM  This was a shared visit with Lonna Cobb.  [name] [name] was interviewed and examined.  We reviewed details of the surgical pathology report with [name] [name].  We discussed adjuvant systemic treatment options.  He has been diagnosed with stage III colon cancer.  The small pulmonary nodules and chest lymph nodes are likely benign.  We will plan for a repeat chest CT at a 16-month interval.  We discussed the decrease in recurrence rate associated with adjuvant 5-fluorouracil based chemotherapy in patients with resected stage III colon cancer.  We discussed the expected benefit with the addition of oxaliplatin.  I recommend adjuvant FOLFOX.  We reviewed potential toxicities associated with the FOLFOX regimen.  He agrees to proceed.  He will be referred for Port-A-Cath placement and a chemotherapy teaching class.  [name] [name] does not appear to have hereditary nonpolyposis colon cancer syndrome, but his family members are at increased risk of developing colorectal cancer and should receive appropriate screening.  A chemotherapy plan was entered today.  I was present for greater than 50% of today's visit.  I performed medical stage making.  Mancel Bale, MD

## 2022-12-07 ENCOUNTER — Telehealth: Payer: Self-pay | Admitting: Oncology

## 2022-12-07 ENCOUNTER — Encounter: Payer: Self-pay | Admitting: Nurse Practitioner

## 2022-12-07 ENCOUNTER — Inpatient Hospital Stay
Admission: RE | Admit: 2022-12-07 | Discharge: 2022-12-07 | Disposition: A | Discharge status: Home / Self Care | Payer: Self-pay | Source: Ambulatory Visit | Attending: Oncology | Admitting: Oncology

## 2022-12-07 ENCOUNTER — Encounter: Payer: Self-pay | Admitting: *Deleted

## 2022-12-07 ENCOUNTER — Other Ambulatory Visit: Payer: Self-pay | Admitting: *Deleted

## 2022-12-07 ENCOUNTER — Encounter: Payer: Self-pay | Admitting: Oncology

## 2022-12-07 ENCOUNTER — Inpatient Hospital Stay: Payer: BC Managed Care – PPO | Attending: Nurse Practitioner | Admitting: Nurse Practitioner

## 2022-12-07 VITALS — BP 150/86 | HR 76 | Temp 98.2°F | Resp 18 | Ht 64.0 in | Wt 189.6 lb

## 2022-12-07 DIAGNOSIS — C184 Malignant neoplasm of transverse colon: Secondary | ICD-10-CM | POA: Diagnosis not present

## 2022-12-07 DIAGNOSIS — Z801 Family history of malignant neoplasm of trachea, bronchus and lung: Secondary | ICD-10-CM | POA: Insufficient documentation

## 2022-12-07 DIAGNOSIS — D509 Iron deficiency anemia, unspecified: Secondary | ICD-10-CM | POA: Diagnosis not present

## 2022-12-07 DIAGNOSIS — I10 Essential (primary) hypertension: Secondary | ICD-10-CM | POA: Diagnosis not present

## 2022-12-07 DIAGNOSIS — Z79630 Long term (current) use of alkylating agent: Secondary | ICD-10-CM | POA: Insufficient documentation

## 2022-12-07 DIAGNOSIS — Z818 Family history of other mental and behavioral disorders: Secondary | ICD-10-CM | POA: Insufficient documentation

## 2022-12-07 DIAGNOSIS — Z5111 Encounter for antineoplastic chemotherapy: Secondary | ICD-10-CM | POA: Insufficient documentation

## 2022-12-07 DIAGNOSIS — Z808 Family history of malignant neoplasm of other organs or systems: Secondary | ICD-10-CM | POA: Insufficient documentation

## 2022-12-07 DIAGNOSIS — Z86010 Personal history of colonic polyps: Secondary | ICD-10-CM

## 2022-12-07 DIAGNOSIS — Z87442 Personal history of urinary calculi: Secondary | ICD-10-CM | POA: Insufficient documentation

## 2022-12-07 DIAGNOSIS — Z823 Family history of stroke: Secondary | ICD-10-CM | POA: Insufficient documentation

## 2022-12-07 DIAGNOSIS — G629 Polyneuropathy, unspecified: Secondary | ICD-10-CM | POA: Insufficient documentation

## 2022-12-07 DIAGNOSIS — Z888 Allergy status to other drugs, medicaments and biological substances status: Secondary | ICD-10-CM | POA: Insufficient documentation

## 2022-12-07 DIAGNOSIS — Z825 Family history of asthma and other chronic lower respiratory diseases: Secondary | ICD-10-CM | POA: Insufficient documentation

## 2022-12-07 DIAGNOSIS — Z8719 Personal history of other diseases of the digestive system: Secondary | ICD-10-CM | POA: Insufficient documentation

## 2022-12-07 DIAGNOSIS — R918 Other nonspecific abnormal finding of lung field: Secondary | ICD-10-CM | POA: Insufficient documentation

## 2022-12-07 DIAGNOSIS — Z79631 Long term (current) use of antimetabolite agent: Secondary | ICD-10-CM | POA: Insufficient documentation

## 2022-12-07 DIAGNOSIS — Z79899 Other long term (current) drug therapy: Secondary | ICD-10-CM | POA: Insufficient documentation

## 2022-12-07 NOTE — Progress Notes (Signed)
START ON PATHWAY REGIMEN - Colorectal     A cycle is every 14 days:     Oxaliplatin      Leucovorin      Fluorouracil      Fluorouracil   **Always confirm dose/schedule in your pharmacy ordering system**  Patient Characteristics: Postoperative without Neoadjuvant Therapy, M0 (Pathologic Staging), Colon, Stage III, High Risk (pT4 or pN2) Tumor Location: Colon Therapeutic Status: Postoperative without Neoadjuvant Therapy, M0 (Pathologic Staging) AJCC M Category: cM0 AJCC T Category: pT4a AJCC N Category: pN1 AJCC 8 Stage Grouping: IIIB Intent of Therapy: Curative Intent, Discussed with Patient 

## 2022-12-07 NOTE — Telephone Encounter (Signed)
Scheduled per 6/7 los, VM left with pt

## 2022-12-07 NOTE — Progress Notes (Signed)
PATIENT NAVIGATOR PROGRESS NOTE  Name: JOESPH MARCY Date: 12/07/2022 MRN: [id number]  DOB: 01/01/1961   Reason for visit:  New Patient appt  Comments:  Met with Mr and Mr Quast during visit with Lonna Cobb NP and Dr Truett Perna Reviewed [facility] placement with them and working on getting placement week of 6/17 for immediate chemo start Given information on FOLFOX therapy and Journey's book with disease specific information Referral made to SW Referral made to dietician Given contact information to call with any issues or questions Will call pt when [facility] placement appt made and give instructions    Time spent counseling/coordinating care: > 60 minutes

## 2022-12-08 ENCOUNTER — Other Ambulatory Visit: Payer: Self-pay

## 2022-12-10 ENCOUNTER — Encounter: Payer: Self-pay | Admitting: *Deleted

## 2022-12-10 ENCOUNTER — Encounter: Payer: Self-pay | Admitting: Oncology

## 2022-12-10 NOTE — Progress Notes (Signed)
PATIENT NAVIGATOR PROGRESS NOTE  Name: [name] [name] Date: 12/10/2022 MRN: [id number]  DOB: 10-05-1960   Reason for visit:  Telephone visit  Comments:  Called and spoke with [name] and gave instructions for [facility] placement on 12/17/22  Verbalized understanding    Time spent counseling/coordinating care: 30-45 minutes

## 2022-12-13 ENCOUNTER — Inpatient Hospital Stay: Payer: BC Managed Care – PPO

## 2022-12-13 NOTE — Progress Notes (Signed)
[facility] Clinical Social Work  Clinical Social Work was referred by Statistician for assessment of psychosocial needs.  Clinical Social Worker contacted patient by phone to offer support and assess for needs.    Patient stated he has all of his needs met.  He reported Dr. Kalman Drape staff did an excellent job explaining everything and he had no questions.  His wife and daughter are very supportive.  He agreed to have CSW mail him [employer] and business card.     Dorothey Baseman, LCSW  Clinical Social Worker [facility]

## 2022-12-14 ENCOUNTER — Other Ambulatory Visit (HOSPITAL_COMMUNITY): Payer: Self-pay | Admitting: Student

## 2022-12-16 ENCOUNTER — Other Ambulatory Visit: Payer: Self-pay | Admitting: Oncology

## 2022-12-16 DIAGNOSIS — C184 Malignant neoplasm of transverse colon: Secondary | ICD-10-CM

## 2022-12-17 ENCOUNTER — Other Ambulatory Visit: Payer: Self-pay

## 2022-12-17 ENCOUNTER — Ambulatory Visit (HOSPITAL_COMMUNITY)
Admission: RE | Admit: 2022-12-17 | Discharge: 2022-12-17 | Disposition: A | Discharge status: Home / Self Care | Payer: BC Managed Care – PPO | Source: Ambulatory Visit | Attending: Nurse Practitioner | Admitting: Nurse Practitioner

## 2022-12-17 DIAGNOSIS — C184 Malignant neoplasm of transverse colon: Secondary | ICD-10-CM | POA: Insufficient documentation

## 2022-12-17 HISTORY — PX: IR IMAGING GUIDED PORT INSERTION: IMG5740

## 2022-12-17 MED ORDER — HEPARIN SOD (PORK) LOCK FLUSH 100 UNIT/ML IV SOLN
INTRAVENOUS | Status: AC
  Filled 2022-12-17: qty 5

## 2022-12-17 MED ORDER — FENTANYL CITRATE (PF) 100 MCG/2ML IJ SOLN
INTRAMUSCULAR | Status: AC | PRN
  Administered 2022-12-17: 25 ug via INTRAVENOUS
  Administered 2022-12-17: 50 ug via INTRAVENOUS
  Administered 2022-12-17: 25 ug via INTRAVENOUS

## 2022-12-17 MED ORDER — LIDOCAINE-EPINEPHRINE 2 %-1:100000 IJ SOLN
20.0000 mL | Freq: Once | INTRAMUSCULAR | Status: DC
  Filled 2022-12-17: qty 20

## 2022-12-17 MED ORDER — LIDOCAINE-EPINEPHRINE 1 %-1:100000 IJ SOLN
INTRAMUSCULAR | Status: AC
  Filled 2022-12-17: qty 1

## 2022-12-17 MED ORDER — FENTANYL CITRATE (PF) 100 MCG/2ML IJ SOLN
INTRAMUSCULAR | Status: AC
  Filled 2022-12-17: qty 2

## 2022-12-17 MED ORDER — MIDAZOLAM HCL 2 MG/2ML IJ SOLN
INTRAMUSCULAR | Status: AC
  Filled 2022-12-17: qty 2

## 2022-12-17 MED ORDER — MIDAZOLAM HCL 2 MG/2ML IJ SOLN
INTRAMUSCULAR | Status: AC | PRN
  Administered 2022-12-17: 1 mg via INTRAVENOUS
  Administered 2022-12-17 (×2): .5 mg via INTRAVENOUS

## 2022-12-17 MED ORDER — SODIUM CHLORIDE 0.9 % IV SOLN: INTRAVENOUS | Status: DC

## 2022-12-17 NOTE — Sedation Documentation (Signed)
Bandaid,gauze and tegaderm to port site

## 2022-12-17 NOTE — H&P (Signed)
Chief Complaint: Patient was seen in consultation today for colon cancer at the request of Thomas,Lisa K  Referring Physician(s): Rana Snare  Supervising Physician: Ruel Favors  Patient Status: [facility] Out[facility]  History of Present Illness:  [name] [name] is a 62 y.o. male followed by oncology for recent diagnosis of colon cancer. Pt underwent colonoscopy 10/10/22 for hx of colon polyps and IDA where circumferential fungating mass was discovered with inability to pass with scope. Biopsy taken at that time revealed adenocarcinoma. Pt was referred to IR for tunneled catheter with port placement for chemotherapy.   Pt denies fever, chills, CP, SOB, N/V, abd pain, HA, dizziness or weakness.  He is NPO per order.   Past Medical History:  Diagnosis Date   Hypertension     Past Surgical History:  Procedure Laterality Date   BACK SURGERY     bulging disc repair   HAND SURGERY     KNEE ARTHROSCOPY     LITHOTRIPSY      Allergies: Compazine [prochlorperazine edisylate] and Prochlorperazine  Medications: Prior to Admission medications   Medication Sig Start Date End Date Taking? Authorizing Provider  ibuprofen (ADVIL,MOTRIN) 200 MG tablet Take 600 mg by mouth every 8 (eight) hours as needed for mild pain. Pain/inflammation    Yes [provider]  OVER THE COUNTER MEDICATION Apply 1 application  topically daily as needed (pain). "mg12 cream"   Yes [provider]  cloNIDine (CATAPRES) 0.1 MG tablet Take 1 tablet (0.1 mg total) by mouth every 6 (six) hours as needed. Patient not taking: Reported on 12/07/2022 02/14/17   Azalia Bilis, MD  HYDROcodone-acetaminophen (NORCO/VICODIN) 5-325 MG tablet Take 2 tablets by mouth every 6 (six) hours as needed. Patient not taking: Reported on 12/07/2022 04/13/18   Roxy Horseman, PA-C  ondansetron (ZOFRAN ODT) 8 MG disintegrating tablet Take 1 tablet (8 mg total) by mouth every 8 (eight) hours as needed for nausea or  vomiting. Patient not taking: Reported on 12/07/2022 02/14/17   Azalia Bilis, MD  oxycodone (ROXICODONE) 30 MG immediate release tablet Take 30 mg by mouth 2 (two) times daily as needed for pain. Patient not taking: Reported on 12/07/2022 02/05/17   [provider]  tamsulosin (FLOMAX) 0.4 MG CAPS capsule Take 1 capsule (0.4 mg total) by mouth 2 (two) times daily. 04/13/18   Roxy Horseman, PA-C  zolpidem (AMBIEN) 5 MG tablet Take 1 tablet (5 mg total) by mouth at bedtime as needed for sleep. Patient not taking: Reported on 12/07/2022 02/14/17   Azalia Bilis, MD     No family history on file.  Social History   Socioeconomic History   Marital status: Married    Spouse name: Not on file   Number of children: Not on file   Years of education: Not on file   Highest education level: Not on file  Occupational History   Not on file  Tobacco Use   Smoking status: Never   Smokeless tobacco: Never  Substance and Sexual Activity   Alcohol use: No   Drug use: No   Sexual activity: Not on file  Other Topics Concern   Not on file  Social History Narrative   Not on file   Social Determinants of Health   Financial Resource Strain: Low Risk  (12/07/2022)   Overall Financial Resource Strain (CARDIA)    Difficulty of Paying Living Expenses: Not hard at all  Food Insecurity: No Food Insecurity (12/07/2022)   Hunger Vital Sign  Worried About Programme researcher, broadcasting/film/video in the Last Year: Never true    Ran Out of Food in the Last Year: Never true  Transportation Needs: No Transportation Needs (12/07/2022)   PRAPARE - Administrator, Civil Service (Medical): No    Lack of Transportation (Non-Medical): No  Physical Activity: Inactive (12/07/2022)   Exercise Vital Sign    Days of Exercise per Week: 0 days    Minutes of Exercise per Session: 0 min  Stress: No Stress Concern Present (12/07/2022)   [employer] of Occupational Health - Occupational Stress Questionnaire    Feeling of Stress  : Not at all  Social Connections: Socially Integrated (12/07/2022)   Social Connection and Isolation Panel [NHANES]    Frequency of Communication with Friends and Family: Twice a week    Frequency of Social Gatherings with Friends and Family: Twice a week    Attends Religious Services: 1 to 4 times per year    Active Member of [employer] or Organizations: No    Attends Engineer, structural: More than 4 times per year    Marital Status: Married     Review of Systems: A 12 point ROS discussed and pertinent positives are indicated in the HPI above.  All other systems are negative.  Review of Systems  All other systems reviewed and are negative.   Vital Signs: BP (!) 137/95   Pulse 64   Temp 98.2 F (36.8 C) (Oral)   Resp 18   Ht 5\' 4"  (1.626 m)   Wt 185 lb (83.9 kg)   SpO2 96%   BMI 31.76 kg/m   Pt is a FULL CODE STATUS  Physical Exam Vitals reviewed.  Constitutional:      General: He is not in acute distress.    Appearance: Normal appearance. He is not ill-appearing.  HENT:     Head: Normocephalic.     Mouth/Throat:     Mouth: Mucous membranes are dry.     Pharynx: Oropharynx is clear.  Eyes:     Extraocular Movements: Extraocular movements intact.     Pupils: Pupils are equal, round, and reactive to light.  Cardiovascular:     Rate and Rhythm: Normal rate and regular rhythm.     Pulses: Normal pulses.     Heart sounds: Normal heart sounds. No murmur heard. Pulmonary:     Effort: Pulmonary effort is normal.     Breath sounds: Normal breath sounds.  Abdominal:     General: Bowel sounds are normal. There is no distension.     Palpations: Abdomen is soft.     Tenderness: There is no abdominal tenderness. There is no guarding.  Musculoskeletal:     Right lower leg: No edema.     Left lower leg: No edema.  Skin:    General: Skin is warm and dry.  Neurological:     Mental Status: He is alert and oriented to person, place, and time.  Psychiatric:        Mood  and Affect: Mood normal.        Behavior: Behavior normal.        Thought Content: Thought content normal.        Judgment: Judgment normal.     Imaging: No results found.  Labs:  CBC: No results for input(s): "WBC", "HGB", "HCT", "PLT" in the last 8760 hours.  COAGS: No results for input(s): "INR", "APTT" in the last 8760 hours.  BMP: No results for input(s): "  NA", "K", "CL", "CO2", "GLUCOSE", "BUN", "CALCIUM", "CREATININE", "GFRNONAA", "GFRAA" in the last 8760 hours.  Invalid input(s): "CMP"  LIVER FUNCTION TESTS: No results for input(s): "BILITOT", "AST", "ALT", "ALKPHOS", "PROT", "ALBUMIN" in the last 8760 hours.  TUMOR MARKERS: No results for input(s): "AFPTM", "CEA", "CA199", "CHROMGRNA" in the last 8760 hours.  Assessment and Plan:  62 yo male with PMHx significant for recently diagnosed colon cancer, IDA and HTN presents to IR for tunneled catheter with port placement.   Pt resting in bed.  He is A&O, calm and pleasant.  He is in no distress.   Risks and benefits of image guided tunneled catheter with port placement  with moderate sedation was discussed with the patient including, but not limited to bleeding, infection, pneumothorax, or fibrin sheath development and need for additional procedures.  All of the patient's questions were answered, patient is agreeable to proceed. Consent signed and in chart.  Thank you for this interesting consult.  I greatly enjoyed meeting [name] [name] and look forward to participating in their care.  A copy of this report was sent to the requesting provider on this date.  Electronically Signed: Shon Hough, NP 12/17/2022, 1:51 PM   I spent a total of 20 minutes in face to face in clinical consultation, greater than 50% of which was counseling/coordinating care for colon cancer.

## 2022-12-17 NOTE — Procedures (Signed)
Interventional Radiology Procedure Note  Procedure: RT internal jugular POWER PORT    Complications: None  Estimated Blood Loss:  MIN  Findings: TIP SVCRA    Sharen Counter, MD

## 2022-12-18 ENCOUNTER — Inpatient Hospital Stay (HOSPITAL_BASED_OUTPATIENT_CLINIC_OR_DEPARTMENT_OTHER): Payer: BC Managed Care – PPO | Admitting: Nurse Practitioner

## 2022-12-18 ENCOUNTER — Inpatient Hospital Stay: Payer: BC Managed Care – PPO

## 2022-12-18 ENCOUNTER — Encounter: Payer: Self-pay | Admitting: Nurse Practitioner

## 2022-12-18 VITALS — BP 143/90 | HR 64 | Temp 98.1°F | Resp 18 | Ht 64.0 in | Wt 190.3 lb

## 2022-12-18 DIAGNOSIS — R918 Other nonspecific abnormal finding of lung field: Secondary | ICD-10-CM | POA: Diagnosis not present

## 2022-12-18 DIAGNOSIS — Z801 Family history of malignant neoplasm of trachea, bronchus and lung: Secondary | ICD-10-CM | POA: Diagnosis not present

## 2022-12-18 DIAGNOSIS — Z8719 Personal history of other diseases of the digestive system: Secondary | ICD-10-CM | POA: Diagnosis not present

## 2022-12-18 DIAGNOSIS — Z5111 Encounter for antineoplastic chemotherapy: Secondary | ICD-10-CM | POA: Diagnosis present

## 2022-12-18 DIAGNOSIS — D509 Iron deficiency anemia, unspecified: Secondary | ICD-10-CM | POA: Diagnosis not present

## 2022-12-18 DIAGNOSIS — C184 Malignant neoplasm of transverse colon: Secondary | ICD-10-CM

## 2022-12-18 DIAGNOSIS — Z818 Family history of other mental and behavioral disorders: Secondary | ICD-10-CM | POA: Diagnosis not present

## 2022-12-18 DIAGNOSIS — Z79899 Other long term (current) drug therapy: Secondary | ICD-10-CM | POA: Diagnosis not present

## 2022-12-18 DIAGNOSIS — Z823 Family history of stroke: Secondary | ICD-10-CM | POA: Diagnosis not present

## 2022-12-18 DIAGNOSIS — Z888 Allergy status to other drugs, medicaments and biological substances status: Secondary | ICD-10-CM | POA: Diagnosis not present

## 2022-12-18 DIAGNOSIS — G629 Polyneuropathy, unspecified: Secondary | ICD-10-CM | POA: Diagnosis not present

## 2022-12-18 DIAGNOSIS — Z808 Family history of malignant neoplasm of other organs or systems: Secondary | ICD-10-CM | POA: Diagnosis not present

## 2022-12-18 DIAGNOSIS — Z825 Family history of asthma and other chronic lower respiratory diseases: Secondary | ICD-10-CM | POA: Diagnosis not present

## 2022-12-18 DIAGNOSIS — Z79631 Long term (current) use of antimetabolite agent: Secondary | ICD-10-CM | POA: Diagnosis not present

## 2022-12-18 DIAGNOSIS — Z95828 Presence of other vascular implants and grafts: Secondary | ICD-10-CM

## 2022-12-18 DIAGNOSIS — Z79630 Long term (current) use of alkylating agent: Secondary | ICD-10-CM | POA: Diagnosis not present

## 2022-12-18 DIAGNOSIS — Z87442 Personal history of urinary calculi: Secondary | ICD-10-CM | POA: Diagnosis not present

## 2022-12-18 LAB — CMP (CANCER CENTER ONLY)
ALT: 22 U/L (ref 0–44)
AST: 15 U/L (ref 15–41)
Albumin: 4.2 g/dL (ref 3.5–5.0)
Alkaline Phosphatase: 74 U/L (ref 38–126)
Anion gap: 8 (ref 5–15)
BUN: 11 mg/dL (ref 6–20)
CO2: 25 mmol/L (ref 22–32)
Calcium: 9.4 mg/dL (ref 8.9–10.3)
Chloride: 104 mmol/L (ref 98–111)
Creatinine: 0.83 mg/dL (ref 0.61–1.24)
GFR, Estimated: >60 mL/min (ref >60)
Glucose, Bld: 130 mg/dL — ABNORMAL HIGH (ref 70–99)
Potassium: 4.1 mmol/L (ref 3.5–5.1)
Sodium: 137 mmol/L (ref 135–145)
Total Bilirubin: 0.4 mg/dL (ref 0.3–1.2)
Total Protein: 6.9 g/dL (ref 6.5–8.1)

## 2022-12-18 LAB — CEA (ACCESS): CEA (CHCC): 1.63 ng/mL (ref 0.00–5.00)

## 2022-12-18 LAB — CBC WITH DIFFERENTIAL (CANCER CENTER ONLY)
Abs Immature Granulocytes: 0.02 10*3/uL (ref 0.00–0.07)
Basophils Absolute: 0.1 10*3/uL (ref 0.0–0.1)
Basophils Relative: 1 %
Eosinophils Absolute: 0.2 10*3/uL (ref 0.0–0.5)
Eosinophils Relative: 3 %
HCT: 36.5 % — ABNORMAL LOW (ref 39.0–52.0)
Hemoglobin: 11.1 g/dL — ABNORMAL LOW (ref 13.0–17.0)
Immature Granulocytes: 0 %
Lymphocytes Relative: 15 %
Lymphs Abs: 1 10*3/uL (ref 0.7–4.0)
MCH: 24.1 pg — ABNORMAL LOW (ref 26.0–34.0)
MCHC: 30.4 g/dL (ref 30.0–36.0)
MCV: 79.2 fL — ABNORMAL LOW (ref 80.0–100.0)
Monocytes Absolute: 0.6 10*3/uL (ref 0.1–1.0)
Monocytes Relative: 9 %
Neutro Abs: 4.9 10*3/uL (ref 1.7–7.7)
Neutrophils Relative %: 72 %
Platelet Count: 301 10*3/uL (ref 150–400)
RBC: 4.61 MIL/uL (ref 4.22–5.81)
RDW: 21 % — ABNORMAL HIGH (ref 11.5–15.5)
WBC Count: 6.8 10*3/uL (ref 4.0–10.5)
nRBC: 0 % (ref 0.0–0.2)

## 2022-12-18 LAB — FERRITIN: Ferritin: 11 ng/mL — ABNORMAL LOW (ref 24–336)

## 2022-12-18 MED ORDER — ONDANSETRON HCL 8 MG PO TABS: 8.0000 mg | ORAL_TABLET | Freq: Three times a day (TID) | ORAL | 0 refills | Status: DC | PRN

## 2022-12-18 MED ORDER — SODIUM CHLORIDE 0.9% FLUSH
10.0000 mL | Freq: Once | INTRAVENOUS | Status: AC
  Administered 2022-12-18: 10 mL via INTRAVENOUS

## 2022-12-18 MED ORDER — LIDOCAINE-PRILOCAINE 2.5-2.5 % EX CREA: 1.0000 | TOPICAL_CREAM | CUTANEOUS | 0 refills | Status: DC | PRN

## 2022-12-18 MED ORDER — HEPARIN SOD (PORK) LOCK FLUSH 100 UNIT/ML IV SOLN
500.0000 [IU] | Freq: Once | INTRAVENOUS | Status: AC
  Administered 2022-12-18: 500 [IU] via INTRAVENOUS

## 2022-12-18 MED ORDER — PROMETHAZINE HCL 12.5 MG PO TABS: 12.5000 mg | ORAL_TABLET | Freq: Four times a day (QID) | ORAL | 0 refills | Status: AC | PRN

## 2022-12-18 NOTE — Patient Instructions (Signed)

## 2022-12-18 NOTE — Progress Notes (Addendum)
[facility] OFFICE PROGRESS NOTE   Diagnosis: Colon cancer  INTERVAL HISTORY:   [name] [name] returns as scheduled.  He had a Port-A-Cath placed yesterday.  He is scheduled to begin treatment with FOLFOX 12/19/2022.  He feels well.  Bowels moving regularly.  He has a good appetite.  No baseline neuropathy symptoms.  Objective:  Vital signs in last 24 hours:  Blood pressure (!) 143/90, pulse 64, temperature 98.1 F (36.7 C), temperature source Oral, resp. rate 18, height 5\' 4"  (1.626 m), weight 190 lb 4.8 oz (86.3 kg), SpO2 99 %.    HEENT: No thrush or ulcers. Resp: Lungs clear bilaterally. Cardio: Regular rate and rhythm. GI: Abdomen soft and nontender.  No hepatosplenomegaly.  Healed surgical incisions. Vascular: No leg edema. Neuro: Vibratory sense intact over the fingertips per tuning fork exam. Skin: No rash. Port-A-Cath site is mildly edematous.  Lab Results:  Lab Results  Component Value Date   WBC 6.8 12/18/2022   HGB 11.1 (L) 12/18/2022   HCT 36.5 (L) 12/18/2022   MCV 79.2 (L) 12/18/2022   PLT 301 12/18/2022   NEUTROABS 4.9 12/18/2022    Imaging:  IR IMAGING GUIDED PORT INSERTION  Result Date: 12/17/2022 CLINICAL DATA:  Metastatic colon cancer, ACCESS FOR CHEMOTHERAPY EXAM: RIGHT INTERNAL JUGULAR SINGLE LUMEN POWER PORT CATHETER INSERTION Date:  12/17/2022 12/17/2022 3:30 pm Radiologist:  Judie Petit. Ruel Favors, MD Guidance:  Ultrasound and fluoroscopic MEDICATIONS: 1% lidocaine with epinephrine local ANESTHESIA/SEDATION: Versed 2.0 mg IV; Fentanyl 100 mcg IV; Moderate Sedation Time:  33 minutes The patient was continuously monitored during the procedure by the interventional radiology nurse under my direct supervision. FLUOROSCOPY: 0 minutes, 54 seconds (4 mGy) COMPLICATIONS: None immediate. CONTRAST:  None. PROCEDURE: Informed consent was obtained from the patient following explanation of the procedure, risks, benefits and alternatives. The patient  understands, agrees and consents for the procedure. All questions were addressed. A time out was performed. Maximal barrier sterile technique utilized including caps, mask, sterile gowns, sterile gloves, large sterile drape, hand hygiene, and 2% chlorhexidine scrub. Under sterile conditions and local anesthesia, right internal jugular micropuncture venous access was performed. Access was performed with ultrasound. Images were obtained for documentation of the patent right internal jugular vein. A guide wire was inserted followed by a transitional dilator. This allowed insertion of a guide wire and catheter into the IVC. Measurements were obtained from the SVC / RA junction back to the right IJ venotomy site. In the right infraclavicular chest, a subcutaneous pocket was created over the second anterior rib. This was done under sterile conditions and local anesthesia. 1% lidocaine with epinephrine was utilized for this. A 2.5 cm incision was made in the skin. Blunt dissection was performed to create a subcutaneous pocket over the right pectoralis major muscle. The pocket was flushed with saline vigorously. There was adequate hemostasis. The port catheter was assembled and checked for leakage. The port catheter was secured in the pocket with two retention sutures. The tubing was tunneled subcutaneously to the right venotomy site and inserted into the SVC/RA junction through a valved peel-away sheath. Position was confirmed with fluoroscopy. Images were obtained for documentation. The patient tolerated the procedure well. No immediate complications. Incisions were closed in a two layer fashion with 4 - 0 Vicryl suture. Dermabond was applied to the skin. The port catheter was accessed, blood was aspirated followed by saline and heparin flushes. Needle was removed. A dry sterile dressing was applied. IMPRESSION: Ultrasound and fluoroscopically guided right internal  jugular single lumen power port catheter insertion. Tip  in the SVC/RA junction. Catheter ready for use. Electronically Signed   By: Judie Petit.  Shick M.D.   On: 12/17/2022 16:08    Medications: I have reviewed the patient's current medications.  Assessment/Plan: Colon cancer, stage IIIb (pT4a, pN1a) Colonoscopy 10/10/2022 -circumferential fungating mass thought to represent the proximal descending colon at 70 cm from the anus that could not be passed with the scope.  Biopsy showed adenocarcinoma, at least intramucosal; intact expression of all 4 mismatch repair proteins.   CT scans 10/17/2022-2 noncalcified nodules adjacent to the horizontal fissure within the right thorax between the right upper lobe and right middle lobe measuring 11 and 7 mm in size; no other pulmonary nodules; 10 mm right paratracheal lymph node liver was negative for suspicious findings; spleen with 2 small densities; a constricting lesion of the distal transverse colon suspected seen to the left of midline adjacent to the splenic flexure measuring 3.6 cm in length; significant narrowing of the lumen of the bowel at this location with bowel wall thickening; hazy stranding within the adjacent mesenteric fat; along the superior margin of the lesion and exophytic 12 mm density possibly representing local invasion and/or adjacent adenopathy.   CEA 10/17/2022 3.0 (normal range for non-smokers less than 3.0, smokers less than 5.0) Laparoscopic extended right hemicolectomy 11/05/2022 by Dr. Byrd Hesselbach.  Final pathology showed 6 cm invasive moderately differentiated adenocarcinoma in the distal transverse colon, invading the visceral peritoneum; resection margins negative; positive lymphovascular and perineural invasion; 1 out of 26 lymph nodes positive for carcinoma; no tumor deposits identified; appendiceal serrated lesion with low-grade dysplasia; pT4a, pN1a Cycle 1 FOLFOX 12/19/2022   Iron deficiency anemia-he reports receiving IV iron prior to the colon surgery 12/18/2022 ferritin 11, hemoglobin 11.1/MCV  79 12/18/2022 ferrous sulfate 1 tablet daily Hypertension History of kidney stones Colonoscopy 10/10/2022-mass could not be passed with the scope Port-A-Cath placement, Interventional Radiology, 12/17/2022  Disposition: [name] [name] appears stable.  He is scheduled to begin adjuvant chemotherapy with FOLFOX tomorrow.  We again reviewed potential toxicities.  He has attended the chemotherapy education class.  He agrees to proceed.    We reviewed the CBC and ferritin from today.  He will begin ferrous sulfate 1 tablet daily.  He will contact the office with constipation.  He will return for follow-up in 2 weeks.  He understands to contact the office in the interim with any problems.    Lonna Cobb ANP/GNP-BC   12/18/2022  2:16 PM

## 2022-12-19 ENCOUNTER — Inpatient Hospital Stay: Payer: BC Managed Care – PPO | Admitting: Nutrition

## 2022-12-19 ENCOUNTER — Inpatient Hospital Stay: Payer: BC Managed Care – PPO

## 2022-12-19 VITALS — BP 130/80 | HR 55 | Temp 98.2°F | Resp 18

## 2022-12-19 DIAGNOSIS — Z5111 Encounter for antineoplastic chemotherapy: Secondary | ICD-10-CM | POA: Diagnosis not present

## 2022-12-19 DIAGNOSIS — C184 Malignant neoplasm of transverse colon: Secondary | ICD-10-CM

## 2022-12-19 MED ORDER — SODIUM CHLORIDE 0.9 % IV SOLN
2400.0000 mg/m2 | INTRAVENOUS | Status: DC
  Administered 2022-12-19: 5000 mg via INTRAVENOUS
  Filled 2022-12-19: qty 100

## 2022-12-19 MED ORDER — LEUCOVORIN CALCIUM INJECTION 350 MG
400.0000 mg/m2 | Freq: Once | INTRAVENOUS | Status: AC
  Administered 2022-12-19: 788 mg via INTRAVENOUS
  Filled 2022-12-19: qty 39.4

## 2022-12-19 MED ORDER — PALONOSETRON HCL INJECTION 0.25 MG/5ML
0.2500 mg | Freq: Once | INTRAVENOUS | Status: AC
  Administered 2022-12-19: 0.25 mg via INTRAVENOUS
  Filled 2022-12-19: qty 5

## 2022-12-19 MED ORDER — FLUOROURACIL CHEMO INJECTION 2.5 GM/50ML
400.0000 mg/m2 | Freq: Once | INTRAVENOUS | Status: AC
  Administered 2022-12-19: 800 mg via INTRAVENOUS
  Filled 2022-12-19: qty 16

## 2022-12-19 MED ORDER — DEXTROSE 5 % IV SOLN: Freq: Once | INTRAVENOUS | Status: AC

## 2022-12-19 MED ORDER — SODIUM CHLORIDE 0.9 % IV SOLN
10.0000 mg | Freq: Once | INTRAVENOUS | Status: AC
  Administered 2022-12-19: 10 mg via INTRAVENOUS
  Filled 2022-12-19: qty 10

## 2022-12-19 MED ORDER — OXALIPLATIN CHEMO INJECTION 100 MG/20ML
85.0000 mg/m2 | Freq: Once | INTRAVENOUS | Status: AC
  Administered 2022-12-19: 165 mg via INTRAVENOUS
  Filled 2022-12-19: qty 33

## 2022-12-19 NOTE — Patient Instructions (Addendum)
The chemotherapy medication bag should finish at 46 hours, 96 hours, or 7 days. For example, if your pump is scheduled for 46 hours and it was put on at 4:00 p.m., it should finish at 2:00 p.m. the day it is scheduled to come off regardless of your appointment time.     Estimated time to finish at 10:15am on Friday 12/21/22.   If the display on your pump reads "Low Volume" and it is beeping, take the batteries out of the pump and come to the cancer center for it to be taken off.   If the pump alarms go off prior to the pump reading "Low Volume" then call [phone] and someone can assist you.  If the plunger comes out and the chemotherapy medication is leaking out, please use your home chemo spill kit to clean up the spill. Do NOT use paper towels or other household products.  If you have problems or questions regarding your pump, please call either [phone] (24 hours a day) or the cancer center Monday-Friday 8:00 a.m.- 4:30 p.m. at the clinic number and we will assist you. If you are unable to get assistance, then go to the nearest Emergency Department and ask the staff to contact the IV team for assistance.    [facility]  Discharge Instructions: Thank you for choosing [facility] to provide your oncology and hematology care.   If you have a lab appointment with the Cancer Center, please go directly to the Cancer Center and check in at the registration area.   Wear comfortable clothing and clothing appropriate for easy access to any Portacath or PICC line.   We strive to give you quality time with your provider. You may need to reschedule your appointment if you arrive late (15 or more minutes).  Arriving late affects you and other patients whose appointments are after yours.  Also, if you miss three or more appointments without notifying the office, you may be dismissed from the clinic at the provider's discretion.      For  prescription refill requests, have your pharmacy contact our office and allow 72 hours for refills to be completed.    Today you received the following chemotherapy and/or immunotherapy agents: oxaliplatin, leucovorin, fluorouracil      To help prevent nausea and vomiting after your treatment, we encourage you to take your nausea medication as directed.  BELOW ARE SYMPTOMS THAT SHOULD BE REPORTED IMMEDIATELY: *FEVER GREATER THAN 100.4 F (38 C) OR HIGHER *CHILLS OR SWEATING *NAUSEA AND VOMITING THAT IS NOT CONTROLLED WITH YOUR NAUSEA MEDICATION *UNUSUAL SHORTNESS OF BREATH *UNUSUAL BRUISING OR BLEEDING *URINARY PROBLEMS (pain or burning when urinating, or frequent urination) *BOWEL PROBLEMS (unusual diarrhea, constipation, pain near the anus) TENDERNESS IN MOUTH AND THROAT WITH OR WITHOUT PRESENCE OF ULCERS (sore throat, sores in mouth, or a toothache) UNUSUAL RASH, SWELLING OR PAIN  UNUSUAL VAGINAL DISCHARGE OR ITCHING   Items with * indicate a potential emergency and should be followed up as soon as possible or go to the Emergency Department if any problems should occur.  Please show the CHEMOTHERAPY ALERT CARD or IMMUNOTHERAPY ALERT CARD at check-in to the Emergency Department and triage nurse.  Should you have questions after your visit or need to cancel or reschedule your appointment, please contact  [facility]  Dept: [phone]  and follow the prompts.  Office hours are 8:00 a.m. to 4:30 p.m. Monday - Friday. Please note  that voicemails left after 4:00 p.m. may not be returned until the following business day.  We are closed weekends and major holidays. You have access to a nurse at all times for urgent questions. Please call the main number to the clinic Dept: [phone] and follow the prompts.   For any non-urgent questions, you may also contact your provider using MyChart. We now offer e-Visits for anyone 1 and older to request care online  for non-urgent symptoms. For details visit [email or web address].   Also download the MyChart app! Go to the app store, search "MyChart", open the app, select [place], and log in with your MyChart username and password.  Oxaliplatin Injection What is this medication? OXALIPLATIN (ox AL i PLA tin) treats colorectal cancer. It works by slowing down the growth of cancer cells. This medicine may be used for other purposes; ask your health care provider or pharmacist if you have questions. COMMON BRAND NAME(S): Eloxatin What should I tell my care team before I take this medication? They need to know if you have any of these conditions: Heart disease History of irregular heartbeat or rhythm Liver disease Low blood cell levels (Boese cells, red cells, and platelets) Lung or breathing disease, such as asthma Take medications that treat or prevent blood clots Tingling of the fingers, toes, or other nerve disorder An unusual or allergic reaction to oxaliplatin, other medications, foods, dyes, or preservatives If you or your partner are pregnant or trying to get pregnant Breast-feeding How should I use this medication? This medication is injected into a vein. It is given by your care team in a hospital or clinic setting. Talk to your care team about the use of this medication in children. Special care may be needed. Overdosage: If you think you have taken too much of this medicine contact a poison control center or emergency room at once. NOTE: This medicine is only for you. Do not share this medicine with others. What if I miss a dose? Keep appointments for follow-up doses. It is important not to miss a dose. Call your care team if you are unable to keep an appointment. What may interact with this medication? Do not take this medication with any of the following: Cisapride Dronedarone Pimozide Thioridazine This medication may also interact with the following: Aspirin and aspirin-like  medications Certain medications that treat or prevent blood clots, such as warfarin, apixaban, dabigatran, and rivaroxaban Cisplatin Cyclosporine Diuretics Medications for infection, such as acyclovir, adefovir, amphotericin B, bacitracin, cidofovir, foscarnet, ganciclovir, gentamicin, pentamidine, vancomycin NSAIDs, medications for pain and inflammation, such as ibuprofen or naproxen Other medications that cause heart rhythm changes Pamidronate Zoledronic acid This list may not describe all possible interactions. Give your health care provider a list of all the medicines, herbs, non-prescription drugs, or dietary supplements you use. Also tell them if you smoke, drink alcohol, or use illegal drugs. Some items may interact with your medicine. What should I watch for while using this medication? Your condition will be monitored carefully while you are receiving this medication. You may need blood work while taking this medication. This medication may make you feel generally unwell. This is not uncommon as chemotherapy can affect healthy cells as well as cancer cells. Report any side effects. Continue your course of treatment even though you feel ill unless your care team tells you to stop. This medication may increase your risk of getting an infection. Call your care team for advice if you get a fever, chills, sore throat, or  other symptoms of a cold or flu. Do not treat yourself. Try to avoid being around people who are sick. Avoid taking medications that contain aspirin, acetaminophen, ibuprofen, naproxen, or ketoprofen unless instructed by your care team. These medications may hide a fever. Be careful brushing or flossing your teeth or using a toothpick because you may get an infection or bleed more easily. If you have any dental work done, tell your dentist you are receiving this medication. This medication can make you more sensitive to cold. Do not drink cold drinks or use ice. Cover exposed  skin before coming in contact with cold temperatures or cold objects. When out in cold weather wear warm clothing and cover your mouth and nose to warm the air that goes into your lungs. Tell your care team if you get sensitive to the cold. Talk to your care team if you or your partner are pregnant or think either of you might be pregnant. This medication can cause serious birth defects if taken during pregnancy and for 9 months after the last dose. A negative pregnancy test is required before starting this medication. A reliable form of contraception is recommended while taking this medication and for 9 months after the last dose. Talk to your care team about effective forms of contraception. Do not father a child while taking this medication and for 6 months after the last dose. Use a condom while having sex during this time period. Do not breastfeed while taking this medication and for 3 months after the last dose. This medication may cause infertility. Talk to your care team if you are concerned about your fertility. What side effects may I notice from receiving this medication? Side effects that you should report to your care team as soon as possible: Allergic reactions--skin rash, itching, hives, swelling of the face, lips, tongue, or throat Bleeding--bloody or black, tar-like stools, vomiting blood or brown material that looks like coffee grounds, red or dark brown urine, small red or purple spots on skin, unusual bruising or bleeding Dry cough, shortness of breath or trouble breathing Heart rhythm changes--fast or irregular heartbeat, dizziness, feeling faint or lightheaded, chest pain, trouble breathing Infection--fever, chills, cough, sore throat, wounds that don't heal, pain or trouble when passing urine, general feeling of discomfort or being unwell Liver injury--right upper belly pain, loss of appetite, nausea, light-colored stool, dark yellow or brown urine, yellowing skin or eyes, unusual  weakness or fatigue Low red blood cell level--unusual weakness or fatigue, dizziness, headache, trouble breathing Muscle injury--unusual weakness or fatigue, muscle pain, dark yellow or brown urine, decrease in amount of urine Pain, tingling, or numbness in the hands or feet Sudden and severe headache, confusion, change in vision, seizures, which may be signs of posterior reversible encephalopathy syndrome (PRES) Unusual bruising or bleeding Side effects that usually do not require medical attention (report to your care team if they continue or are bothersome): Diarrhea Nausea Pain, redness, or swelling with sores inside the mouth or throat Unusual weakness or fatigue Vomiting This list may not describe all possible side effects. Call your doctor for medical advice about side effects. You may report side effects to FDA at 1-800-FDA-1088. Where should I keep my medication? This medication is given in a hospital or clinic. It will not be stored at home. NOTE: This sheet is a summary. It may not cover all possible information. If you have questions about this medicine, talk to your doctor, pharmacist, or health care provider.  2024 Elsevier/Gold Standard (  2022-08-26 00:00:00)  Leucovorin Injection What is this medication? LEUCOVORIN (loo koe VOR in) prevents side effects from certain medications, such as methotrexate. It works by increasing folate levels. This helps protect healthy cells in your body. It may also be used to treat anemia caused by low levels of folate. It can also be used with fluorouracil, a type of chemotherapy, to treat colorectal cancer. It works by increasing the effects of fluorouracil in the body. This medicine may be used for other purposes; ask your health care provider or pharmacist if you have questions. What should I tell my care team before I take this medication? They need to know if you have any of these conditions: Anemia from low levels of vitamin B12 in the  blood An unusual or allergic reaction to leucovorin, folic acid, other medications, foods, dyes, or preservatives Pregnant or trying to get pregnant Breastfeeding How should I use this medication? This medication is injected into a vein or a muscle. It is given by your care team in a hospital or clinic setting. Talk to your care team about the use of this medication in children. Special care may be needed. Overdosage: If you think you have taken too much of this medicine contact a poison control center or emergency room at once. NOTE: This medicine is only for you. Do not share this medicine with others. What if I miss a dose? Keep appointments for follow-up doses. It is important not to miss your dose. Call your care team if you are unable to keep an appointment. What may interact with this medication? Capecitabine Fluorouracil Phenobarbital Phenytoin Primidone Trimethoprim;sulfamethoxazole This list may not describe all possible interactions. Give your health care provider a list of all the medicines, herbs, non-prescription drugs, or dietary supplements you use. Also tell them if you smoke, drink alcohol, or use illegal drugs. Some items may interact with your medicine. What should I watch for while using this medication? Your condition will be monitored carefully while you are receiving this medication. This medication may increase the side effects of 5-fluorouracil. Tell your care team if you have diarrhea or mouth sores that do not get better or that get worse. What side effects may I notice from receiving this medication? Side effects that you should report to your care team as soon as possible: Allergic reactions--skin rash, itching, hives, swelling of the face, lips, tongue, or throat This list may not describe all possible side effects. Call your doctor for medical advice about side effects. You may report side effects to FDA at 1-800-FDA-1088. Where should I keep my  medication? This medication is given in a hospital or clinic. It will not be stored at home. NOTE: This sheet is a summary. It may not cover all possible information. If you have questions about this medicine, talk to your doctor, pharmacist, or health care provider.  2024 Elsevier/Gold Standard (2021-11-21 00:00:00)  Fluorouracil Injection What is this medication? FLUOROURACIL (flure oh YOOR a sil) treats some types of cancer. It works by slowing down the growth of cancer cells. This medicine may be used for other purposes; ask your health care provider or pharmacist if you have questions. COMMON BRAND NAME(S): Adrucil What should I tell my care team before I take this medication? They need to know if you have any of these conditions: Blood disorders Dihydropyrimidine dehydrogenase (DPD) deficiency Infection, such as chickenpox, cold sores, herpes Kidney disease Liver disease Poor nutrition Recent or ongoing radiation therapy An unusual or allergic reaction to  fluorouracil, other medications, foods, dyes, or preservatives If you or your partner are pregnant or trying to get pregnant Breast-feeding How should I use this medication? This medication is injected into a vein. It is administered by your care team in a hospital or clinic setting. Talk to your care team about the use of this medication in children. Special care may be needed. Overdosage: If you think you have taken too much of this medicine contact a poison control center or emergency room at once. NOTE: This medicine is only for you. Do not share this medicine with others. What if I miss a dose? Keep appointments for follow-up doses. It is important not to miss your dose. Call your care team if you are unable to keep an appointment. What may interact with this medication? Do not take this medication with any of the following: Live virus vaccines This medication may also interact with the following: Medications that treat  or prevent blood clots, such as warfarin, enoxaparin, dalteparin This list may not describe all possible interactions. Give your health care provider a list of all the medicines, herbs, non-prescription drugs, or dietary supplements you use. Also tell them if you smoke, drink alcohol, or use illegal drugs. Some items may interact with your medicine. What should I watch for while using this medication? Your condition will be monitored carefully while you are receiving this medication. This medication may make you feel generally unwell. This is not uncommon as chemotherapy can affect healthy cells as well as cancer cells. Report any side effects. Continue your course of treatment even though you feel ill unless your care team tells you to stop. In some cases, you may be given additional medications to help with side effects. Follow all directions for their use. This medication may increase your risk of getting an infection. Call your care team for advice if you get a fever, chills, sore throat, or other symptoms of a cold or flu. Do not treat yourself. Try to avoid being around people who are sick. This medication may increase your risk to bruise or bleed. Call your care team if you notice any unusual bleeding. Be careful brushing or flossing your teeth or using a toothpick because you may get an infection or bleed more easily. If you have any dental work done, tell your dentist you are receiving this medication. Avoid taking medications that contain aspirin, acetaminophen, ibuprofen, naproxen, or ketoprofen unless instructed by your care team. These medications may hide a fever. Do not treat diarrhea with over the counter products. Contact your care team if you have diarrhea that lasts more than 2 days or if it is severe and watery. This medication can make you more sensitive to the sun. Keep out of the sun. If you cannot avoid being in the sun, wear protective clothing and sunscreen. Do not use sun lamps,  tanning beds, or tanning booths. Talk to your care team if you or your partner wish to become pregnant or think you might be pregnant. This medication can cause serious birth defects if taken during pregnancy and for 3 months after the last dose. A reliable form of contraception is recommended while taking this medication and for 3 months after the last dose. Talk to your care team about effective forms of contraception. Do not father a child while taking this medication and for 3 months after the last dose. Use a condom while having sex during this time period. Do not breastfeed while taking this medication. This medication  may cause infertility. Talk to your care team if you are concerned about your fertility. What side effects may I notice from receiving this medication? Side effects that you should report to your care team as soon as possible: Allergic reactions--skin rash, itching, hives, swelling of the face, lips, tongue, or throat Heart attack--pain or tightness in the chest, shoulders, arms, or jaw, nausea, shortness of breath, cold or clammy skin, feeling faint or lightheaded Heart failure--shortness of breath, swelling of the ankles, feet, or hands, sudden weight gain, unusual weakness or fatigue Heart rhythm changes--fast or irregular heartbeat, dizziness, feeling faint or lightheaded, chest pain, trouble breathing High ammonia level--unusual weakness or fatigue, confusion, loss of appetite, nausea, vomiting, seizures Infection--fever, chills, cough, sore throat, wounds that don't heal, pain or trouble when passing urine, general feeling of discomfort or being unwell Low red blood cell level--unusual weakness or fatigue, dizziness, headache, trouble breathing Pain, tingling, or numbness in the hands or feet, muscle weakness, change in vision, confusion or trouble speaking, loss of balance or coordination, trouble walking, seizures Redness, swelling, and blistering of the skin over hands and  feet Severe or prolonged diarrhea Unusual bruising or bleeding Side effects that usually do not require medical attention (report to your care team if they continue or are bothersome): Dry skin Headache Increased tears Nausea Pain, redness, or swelling with sores inside the mouth or throat Sensitivity to light Vomiting This list may not describe all possible side effects. Call your doctor for medical advice about side effects. You may report side effects to FDA at 1-800-FDA-1088. Where should I keep my medication? This medication is given in a hospital or clinic. It will not be stored at home. NOTE: This sheet is a summary. It may not cover all possible information. If you have questions about this medicine, talk to your doctor, pharmacist, or health care provider.  2024 Elsevier/Gold Standard (2021-10-24 00:00:00)

## 2022-12-19 NOTE — Progress Notes (Signed)
62 year old male diagnosed with Stage IIIB Colon cancer S/P right hemicolectomy. He is receiving his first treatment of FOLFOX today and is followed by Dr. Truett Perna.  PMH includes HTN, IDA, Kidney stones.  Medications include Zofran.  Labs include Glucose 130.  Height: 64 inches. Weight: 190 pounds on June 18. BMI: 32.66.  Patient reports no nutrition impact symptoms at this time. Wife endorses patient has good appetite and eating normally.  Nutrition Diagnosis: Food and Nutrition Knowledge Deficit related to cancer and associated treatments as evidenced by no prior need for nutrition related information.  Intervention: Educated on importance of small frequent meals and snacks with adequate calories and protein to maintain wt. Reviewed strategies for eating with cold sensitivity. Provided nutrition handouts and contact information.  Monitoring, Evaluation, Goals: Patient will tolerate adequate calories and protein to minimize wt loss.  No follow up scheduled at this time. Patient and wife encouraged to contact RD for questions or concerns.

## 2022-12-20 ENCOUNTER — Telehealth: Payer: Self-pay

## 2022-12-20 NOTE — Telephone Encounter (Signed)
Called patient for first time chemo follow-up.  Patient stated he has tolerated treatment well with no issues. Patient stated he has been able to eat and drink fluids without any issued.  Pump stop appointment confirmed for 12/21/22 and patient instructed to contact office with any questions or concerns.  Patient verbalized understanding.

## 2022-12-21 ENCOUNTER — Inpatient Hospital Stay: Payer: BC Managed Care – PPO

## 2022-12-21 VITALS — BP 140/86 | HR 69 | Temp 97.7°F | Resp 18

## 2022-12-21 DIAGNOSIS — Z5111 Encounter for antineoplastic chemotherapy: Secondary | ICD-10-CM | POA: Diagnosis not present

## 2022-12-21 DIAGNOSIS — C184 Malignant neoplasm of transverse colon: Secondary | ICD-10-CM

## 2022-12-21 MED ORDER — HEPARIN SOD (PORK) LOCK FLUSH 100 UNIT/ML IV SOLN
500.0000 [IU] | Freq: Once | INTRAVENOUS | Status: AC | PRN
  Administered 2022-12-21: 500 [IU]

## 2022-12-21 MED ORDER — SODIUM CHLORIDE 0.9% FLUSH
10.0000 mL | INTRAVENOUS | Status: DC | PRN
  Administered 2022-12-21: 10 mL

## 2022-12-21 NOTE — Patient Instructions (Signed)

## 2022-12-26 ENCOUNTER — Other Ambulatory Visit: Payer: Self-pay

## 2022-12-30 ENCOUNTER — Other Ambulatory Visit: Payer: Self-pay | Admitting: Oncology

## 2022-12-30 DIAGNOSIS — C184 Malignant neoplasm of transverse colon: Secondary | ICD-10-CM

## 2023-01-02 ENCOUNTER — Inpatient Hospital Stay: Payer: BC Managed Care – PPO

## 2023-01-02 ENCOUNTER — Inpatient Hospital Stay: Payer: BC Managed Care – PPO | Attending: Nurse Practitioner

## 2023-01-02 ENCOUNTER — Inpatient Hospital Stay (HOSPITAL_BASED_OUTPATIENT_CLINIC_OR_DEPARTMENT_OTHER): Payer: BC Managed Care – PPO | Admitting: Oncology

## 2023-01-02 VITALS — BP 136/93 | HR 60 | Resp 20

## 2023-01-02 DIAGNOSIS — R110 Nausea: Secondary | ICD-10-CM | POA: Diagnosis not present

## 2023-01-02 DIAGNOSIS — C184 Malignant neoplasm of transverse colon: Secondary | ICD-10-CM | POA: Insufficient documentation

## 2023-01-02 DIAGNOSIS — R197 Diarrhea, unspecified: Secondary | ICD-10-CM | POA: Insufficient documentation

## 2023-01-02 DIAGNOSIS — Z79631 Long term (current) use of antimetabolite agent: Secondary | ICD-10-CM | POA: Diagnosis not present

## 2023-01-02 DIAGNOSIS — Z87442 Personal history of urinary calculi: Secondary | ICD-10-CM | POA: Insufficient documentation

## 2023-01-02 DIAGNOSIS — I10 Essential (primary) hypertension: Secondary | ICD-10-CM | POA: Diagnosis not present

## 2023-01-02 DIAGNOSIS — D509 Iron deficiency anemia, unspecified: Secondary | ICD-10-CM | POA: Insufficient documentation

## 2023-01-02 DIAGNOSIS — Z79630 Long term (current) use of alkylating agent: Secondary | ICD-10-CM | POA: Diagnosis not present

## 2023-01-02 DIAGNOSIS — Z5111 Encounter for antineoplastic chemotherapy: Secondary | ICD-10-CM | POA: Diagnosis present

## 2023-01-02 DIAGNOSIS — Z79899 Other long term (current) drug therapy: Secondary | ICD-10-CM | POA: Insufficient documentation

## 2023-01-02 LAB — CBC WITH DIFFERENTIAL (CANCER CENTER ONLY)
Abs Immature Granulocytes: 0.01 10*3/uL (ref 0.00–0.07)
Basophils Absolute: 0.1 10*3/uL (ref 0.0–0.1)
Basophils Relative: 1 %
Eosinophils Absolute: 0.3 10*3/uL (ref 0.0–0.5)
Eosinophils Relative: 6 %
HCT: 35.9 % — ABNORMAL LOW (ref 39.0–52.0)
Hemoglobin: 11.1 g/dL — ABNORMAL LOW (ref 13.0–17.0)
Immature Granulocytes: 0 %
Lymphocytes Relative: 20 %
Lymphs Abs: 0.9 10*3/uL (ref 0.7–4.0)
MCH: 25 pg — ABNORMAL LOW (ref 26.0–34.0)
MCHC: 30.9 g/dL (ref 30.0–36.0)
MCV: 80.9 fL (ref 80.0–100.0)
Monocytes Absolute: 0.4 10*3/uL (ref 0.1–1.0)
Monocytes Relative: 8 %
Neutro Abs: 3.1 10*3/uL (ref 1.7–7.7)
Neutrophils Relative %: 65 %
Platelet Count: 204 10*3/uL (ref 150–400)
RBC: 4.44 MIL/uL (ref 4.22–5.81)
RDW: 21.9 % — ABNORMAL HIGH (ref 11.5–15.5)
WBC Count: 4.7 10*3/uL (ref 4.0–10.5)
nRBC: 0 % (ref 0.0–0.2)

## 2023-01-02 LAB — CMP (CANCER CENTER ONLY)
ALT: 14 U/L (ref 0–44)
AST: 16 U/L (ref 15–41)
Albumin: 4.3 g/dL (ref 3.5–5.0)
Alkaline Phosphatase: 73 U/L (ref 38–126)
Anion gap: 9 (ref 5–15)
BUN: 12 mg/dL (ref 6–20)
CO2: 26 mmol/L (ref 22–32)
Calcium: 9.6 mg/dL (ref 8.9–10.3)
Chloride: 104 mmol/L (ref 98–111)
Creatinine: 1.03 mg/dL (ref 0.61–1.24)
GFR, Estimated: >60 mL/min (ref >60)
Glucose, Bld: 144 mg/dL — ABNORMAL HIGH (ref 70–99)
Potassium: 4.2 mmol/L (ref 3.5–5.1)
Sodium: 139 mmol/L (ref 135–145)
Total Bilirubin: 0.4 mg/dL (ref 0.3–1.2)
Total Protein: 6.9 g/dL (ref 6.5–8.1)

## 2023-01-02 MED ORDER — FLUOROURACIL CHEMO INJECTION 2.5 GM/50ML
400.0000 mg/m2 | Freq: Once | INTRAVENOUS | Status: AC
  Administered 2023-01-02: 800 mg via INTRAVENOUS
  Filled 2023-01-02: qty 16

## 2023-01-02 MED ORDER — OXALIPLATIN CHEMO INJECTION 100 MG/20ML
85.0000 mg/m2 | Freq: Once | INTRAVENOUS | Status: AC
  Administered 2023-01-02: 165 mg via INTRAVENOUS
  Filled 2023-01-02: qty 33

## 2023-01-02 MED ORDER — SODIUM CHLORIDE 0.9 % IV SOLN
10.0000 mg | Freq: Once | INTRAVENOUS | Status: AC
  Administered 2023-01-02: 10 mg via INTRAVENOUS
  Filled 2023-01-02: qty 10

## 2023-01-02 MED ORDER — SODIUM CHLORIDE 0.9 % IV SOLN
2400.0000 mg/m2 | INTRAVENOUS | Status: DC
  Administered 2023-01-02: 5000 mg via INTRAVENOUS
  Filled 2023-01-02: qty 100

## 2023-01-02 MED ORDER — LEUCOVORIN CALCIUM INJECTION 350 MG
400.0000 mg/m2 | Freq: Once | INTRAVENOUS | Status: AC
  Administered 2023-01-02: 788 mg via INTRAVENOUS
  Filled 2023-01-02: qty 39.4

## 2023-01-02 MED ORDER — PALONOSETRON HCL INJECTION 0.25 MG/5ML
0.2500 mg | Freq: Once | INTRAVENOUS | Status: AC
  Administered 2023-01-02: 0.25 mg via INTRAVENOUS
  Filled 2023-01-02: qty 5

## 2023-01-02 MED ORDER — DEXTROSE 5 % IV SOLN: Freq: Once | INTRAVENOUS | Status: AC

## 2023-01-02 NOTE — Progress Notes (Signed)
[facility] OFFICE PROGRESS NOTE   Diagnosis: Colon cancer  INTERVAL HISTORY:   [name] [name] completed cycle 1 FOLFOX on 12/19/2022.  He reports cold sensitivity for a few days following chemotherapy.  No mouth sores, diarrhea, or persistent neuropathy symptoms.  He feels well.  Good appetite.  He had mild nausea.  This was relieved with Phenergan.  Objective:  Vital signs in last 24 hours:  Blood pressure 134/83, pulse 71, temperature 98.2 F (36.8 C), temperature source Oral, resp. rate 18, height 5\' 4"  (1.626 m), weight 189 lb 14.4 oz (86.1 kg), SpO2 100 %.    HEENT: No thrush or ulcers Resp: Lungs clear bilaterally Cardio: Regular rate and rhythm GI: No hepatosplenomegaly, nontender Vascular: No leg edema Skin: Palms without erythema  Mild erythema at the right side of the Port-A-Cath incision, no tenderness  Lab Results:  Lab Results  Component Value Date   WBC 4.7 01/02/2023   HGB 11.1 (L) 01/02/2023   HCT 35.9 (L) 01/02/2023   MCV 80.9 01/02/2023   PLT 204 01/02/2023   NEUTROABS 3.1 01/02/2023    CMP  Lab Results  Component Value Date   NA 137 12/18/2022   K 4.1 12/18/2022   CL 104 12/18/2022   CO2 25 12/18/2022   GLUCOSE 130 (H) 12/18/2022   BUN 11 12/18/2022   CREATININE 0.83 12/18/2022   CALCIUM 9.4 12/18/2022   PROT 6.9 12/18/2022   ALBUMIN 4.2 12/18/2022   AST 15 12/18/2022   ALT 22 12/18/2022   ALKPHOS 74 12/18/2022   BILITOT 0.4 12/18/2022   GFRNONAA >60 12/18/2022   GFRAA >60 04/13/2018    Lab Results  Component Value Date   CEA 1.63 12/18/2022     Medications: I have reviewed the patient's current medications.   Assessment/Plan: Colon cancer, stage IIIb (pT4a, pN1a) Colonoscopy 10/10/2022 -circumferential fungating mass thought to represent the proximal descending colon at 70 cm from the anus that could not be passed with the scope.  Biopsy showed adenocarcinoma, at least intramucosal; intact expression of all 4  mismatch repair proteins.   CT scans 10/17/2022-2 noncalcified nodules adjacent to the horizontal fissure within the right thorax between the right upper lobe and right middle lobe measuring 11 and 7 mm in size; no other pulmonary nodules; 10 mm right paratracheal lymph node liver was negative for suspicious findings; spleen with 2 small densities; a constricting lesion of the distal transverse colon suspected seen to the left of midline adjacent to the splenic flexure measuring 3.6 cm in length; significant narrowing of the lumen of the bowel at this location with bowel wall thickening; hazy stranding within the adjacent mesenteric fat; along the superior margin of the lesion and exophytic 12 mm density possibly representing local invasion and/or adjacent adenopathy.   CEA 10/17/2022 3.0 (normal range for non-smokers less than 3.0, smokers less than 5.0) Laparoscopic extended right hemicolectomy 11/05/2022 by Dr. Byrd Hesselbach.  Final pathology showed 6 cm invasive moderately differentiated adenocarcinoma in the distal transverse colon, invading the visceral peritoneum; resection margins negative; positive lymphovascular and perineural invasion; 1 out of 26 lymph nodes positive for carcinoma; no tumor deposits identified; appendiceal serrated lesion with low-grade dysplasia; pT4a, pN1a Cycle 1 FOLFOX 12/19/2022 Cycle 2 FOLFOX 01/02/2023   Iron deficiency anemia-he reports receiving IV iron prior to the colon surgery 12/18/2022 ferritin 11, hemoglobin 11.1/MCV 79 12/18/2022 ferrous sulfate 1 tablet daily Hypertension History of kidney stones Colonoscopy 10/10/2022-mass could not be passed with the scope Port-A-Cath placement, Interventional Radiology,  12/17/2022    Disposition: [name] [name] completed 1 cycle of FOLFOX.  He tolerated the chemotherapy well.  He will complete cycle 2 today.  There is mild erythema at the Port-A-Cath site.  He will call for increased erythema, tenderness, or a fever.  He will return for  an office visit and chemotherapy in 2 weeks.  Thornton Papas, MD  01/02/2023  8:34 AM

## 2023-01-02 NOTE — Patient Instructions (Signed)
[facility]   Discharge Instructions: Thank you for choosing [facility] to provide your oncology and hematology care.   If you have a lab appointment with the [facility], please go directly to the [facility] and check in at the registration area.   Wear comfortable clothing and clothing appropriate for easy access to any Portacath or PICC line.   We strive to give you quality time with your provider. You may need to reschedule your appointment if you arrive late (15 or more minutes).  Arriving late affects you and other patients whose appointments are after yours.  Also, if you miss three or more appointments without notifying the office, you may be dismissed from the clinic at the provider's discretion.      For prescription refill requests, have your pharmacy contact our office and allow 72 hours for refills to be completed.    Today you received the following chemotherapy and/or immunotherapy agents Oxaliplatin (ELOXATIN), Leucovorin & Flourouracil (ADRUCIL).      To help prevent nausea and vomiting after your treatment, we encourage you to take your nausea medication as directed.  BELOW ARE SYMPTOMS THAT SHOULD BE REPORTED IMMEDIATELY: *FEVER GREATER THAN 100.4 F (38 C) OR HIGHER *CHILLS OR SWEATING *NAUSEA AND VOMITING THAT IS NOT CONTROLLED WITH YOUR NAUSEA MEDICATION *UNUSUAL SHORTNESS OF BREATH *UNUSUAL BRUISING OR BLEEDING *URINARY PROBLEMS (pain or burning when urinating, or frequent urination) *BOWEL PROBLEMS (unusual diarrhea, constipation, pain near the anus) TENDERNESS IN MOUTH AND THROAT WITH OR WITHOUT PRESENCE OF ULCERS (sore throat, sores in mouth, or a toothache) UNUSUAL RASH, SWELLING OR PAIN  UNUSUAL VAGINAL DISCHARGE OR ITCHING   Items with * indicate a potential emergency and should be followed up as soon as possible or go to the Emergency Department if any problems should occur.  Please show the  CHEMOTHERAPY ALERT CARD or IMMUNOTHERAPY ALERT CARD at check-in to the Emergency Department and triage nurse.  Should you have questions after your visit or need to cancel or reschedule your appointment, please contact [facility]  Dept: [phone]  and follow the prompts.  Office hours are 8:00 a.m. to 4:30 p.m. Monday - Friday. Please note that voicemails left after 4:00 p.m. may not be returned until the following business day.  We are closed weekends and major holidays. You have access to a nurse at all times for urgent questions. Please call the main number to the clinic Dept: [phone] and follow the prompts.   For any non-urgent questions, you may also contact your provider using MyChart. We now offer e-Visits for anyone 42 and older to request care online for non-urgent symptoms. For details visit [email or web address].   Also download the MyChart app! Go to the app store, search "MyChart", open the app, select [facility], and log in with your MyChart username and password.  Oxaliplatin Injection What is this medication? OXALIPLATIN (ox AL i PLA tin) treats colorectal cancer. It works by slowing down the growth of cancer cells. This medicine may be used for other purposes; ask your health care provider or pharmacist if you have questions. COMMON BRAND NAME(S): Eloxatin What should I tell my care team before I take this medication? They need to know if you have any of these conditions: Heart disease History of irregular heartbeat or rhythm Liver disease Low blood cell levels (Leach cells, red cells, and platelets) Lung or breathing disease, such as asthma Take medications that treat  or prevent blood clots Tingling of the fingers, toes, or other nerve disorder An unusual or allergic reaction to oxaliplatin, other medications, foods, dyes, or preservatives If you or your partner are pregnant or trying to get pregnant Breast-feeding How should  I use this medication? This medication is injected into a vein. It is given by your care team in a hospital or clinic setting. Talk to your care team about the use of this medication in children. Special care may be needed. Overdosage: If you think you have taken too much of this medicine contact a poison control center or emergency room at once. NOTE: This medicine is only for you. Do not share this medicine with others. What if I miss a dose? Keep appointments for follow-up doses. It is important not to miss a dose. Call your care team if you are unable to keep an appointment. What may interact with this medication? Do not take this medication with any of the following: Cisapride Dronedarone Pimozide Thioridazine This medication may also interact with the following: Aspirin and aspirin-like medications Certain medications that treat or prevent blood clots, such as warfarin, apixaban, dabigatran, and rivaroxaban Cisplatin Cyclosporine Diuretics Medications for infection, such as acyclovir, adefovir, amphotericin B, bacitracin, cidofovir, foscarnet, ganciclovir, gentamicin, pentamidine, vancomycin NSAIDs, medications for pain and inflammation, such as ibuprofen or naproxen Other medications that cause heart rhythm changes Pamidronate Zoledronic acid This list may not describe all possible interactions. Give your health care provider a list of all the medicines, herbs, non-prescription drugs, or dietary supplements you use. Also tell them if you smoke, drink alcohol, or use illegal drugs. Some items may interact with your medicine. What should I watch for while using this medication? Your condition will be monitored carefully while you are receiving this medication. You may need blood work while taking this medication. This medication may make you feel generally unwell. This is not uncommon as chemotherapy can affect healthy cells as well as cancer cells. Report any side effects. Continue  your course of treatment even though you feel ill unless your care team tells you to stop. This medication may increase your risk of getting an infection. Call your care team for advice if you get a fever, chills, sore throat, or other symptoms of a cold or flu. Do not treat yourself. Try to avoid being around people who are sick. Avoid taking medications that contain aspirin, acetaminophen, ibuprofen, naproxen, or ketoprofen unless instructed by your care team. These medications may hide a fever. Be careful brushing or flossing your teeth or using a toothpick because you may get an infection or bleed more easily. If you have any dental work done, tell your dentist you are receiving this medication. This medication can make you more sensitive to cold. Do not drink cold drinks or use ice. Cover exposed skin before coming in contact with cold temperatures or cold objects. When out in cold weather wear warm clothing and cover your mouth and nose to warm the air that goes into your lungs. Tell your care team if you get sensitive to the cold. Talk to your care team if you or your partner are pregnant or think either of you might be pregnant. This medication can cause serious birth defects if taken during pregnancy and for 9 months after the last dose. A negative pregnancy test is required before starting this medication. A reliable form of contraception is recommended while taking this medication and for 9 months after the last dose. Talk to your care  team about effective forms of contraception. Do not father a child while taking this medication and for 6 months after the last dose. Use a condom while having sex during this time period. Do not breastfeed while taking this medication and for 3 months after the last dose. This medication may cause infertility. Talk to your care team if you are concerned about your fertility. What side effects may I notice from receiving this medication? Side effects that you  should report to your care team as soon as possible: Allergic reactions--skin rash, itching, hives, swelling of the face, lips, tongue, or throat Bleeding--bloody or black, tar-like stools, vomiting blood or brown material that looks like coffee grounds, red or dark brown urine, small red or purple spots on skin, unusual bruising or bleeding Dry cough, shortness of breath or trouble breathing Heart rhythm changes--fast or irregular heartbeat, dizziness, feeling faint or lightheaded, chest pain, trouble breathing Infection--fever, chills, cough, sore throat, wounds that don't heal, pain or trouble when passing urine, general feeling of discomfort or being unwell Liver injury--right upper belly pain, loss of appetite, nausea, light-colored stool, dark yellow or brown urine, yellowing skin or eyes, unusual weakness or fatigue Low red blood cell level--unusual weakness or fatigue, dizziness, headache, trouble breathing Muscle injury--unusual weakness or fatigue, muscle pain, dark yellow or brown urine, decrease in amount of urine Pain, tingling, or numbness in the hands or feet Sudden and severe headache, confusion, change in vision, seizures, which may be signs of posterior reversible encephalopathy syndrome (PRES) Unusual bruising or bleeding Side effects that usually do not require medical attention (report to your care team if they continue or are bothersome): Diarrhea Nausea Pain, redness, or swelling with sores inside the mouth or throat Unusual weakness or fatigue Vomiting This list may not describe all possible side effects. Call your doctor for medical advice about side effects. You may report side effects to FDA at 1-800-FDA-1088. Where should I keep my medication? This medication is given in a hospital or clinic. It will not be stored at home. NOTE: This sheet is a summary. It may not cover all possible information. If you have questions about this medicine, talk to your doctor,  pharmacist, or health care provider.  2024 Elsevier/Gold Standard (2022-08-26 00:00:00)  Leucovorin Injection What is this medication? LEUCOVORIN (loo koe VOR in) prevents side effects from certain medications, such as methotrexate. It works by increasing folate levels. This helps protect healthy cells in your body. It may also be used to treat anemia caused by low levels of folate. It can also be used with fluorouracil, a type of chemotherapy, to treat colorectal cancer. It works by increasing the effects of fluorouracil in the body. This medicine may be used for other purposes; ask your health care provider or pharmacist if you have questions. What should I tell my care team before I take this medication? They need to know if you have any of these conditions: Anemia from low levels of vitamin B12 in the blood An unusual or allergic reaction to leucovorin, folic acid, other medications, foods, dyes, or preservatives Pregnant or trying to get pregnant Breastfeeding How should I use this medication? This medication is injected into a vein or a muscle. It is given by your care team in a hospital or clinic setting. Talk to your care team about the use of this medication in children. Special care may be needed. Overdosage: If you think you have taken too much of this medicine contact a poison control center  or emergency room at once. NOTE: This medicine is only for you. Do not share this medicine with others. What if I miss a dose? Keep appointments for follow-up doses. It is important not to miss your dose. Call your care team if you are unable to keep an appointment. What may interact with this medication? Capecitabine Fluorouracil Phenobarbital Phenytoin Primidone Trimethoprim;sulfamethoxazole This list may not describe all possible interactions. Give your health care provider a list of all the medicines, herbs, non-prescription drugs, or dietary supplements you use. Also tell them if you  smoke, drink alcohol, or use illegal drugs. Some items may interact with your medicine. What should I watch for while using this medication? Your condition will be monitored carefully while you are receiving this medication. This medication may increase the side effects of 5-fluorouracil. Tell your care team if you have diarrhea or mouth sores that do not get better or that get worse. What side effects may I notice from receiving this medication? Side effects that you should report to your care team as soon as possible: Allergic reactions--skin rash, itching, hives, swelling of the face, lips, tongue, or throat This list may not describe all possible side effects. Call your doctor for medical advice about side effects. You may report side effects to FDA at 1-800-FDA-1088. Where should I keep my medication? This medication is given in a hospital or clinic. It will not be stored at home. NOTE: This sheet is a summary. It may not cover all possible information. If you have questions about this medicine, talk to your doctor, pharmacist, or health care provider.  2024 Elsevier/Gold Standard (2021-11-21 00:00:00)  Fluorouracil Injection What is this medication? FLUOROURACIL (flure oh YOOR a sil) treats some types of cancer. It works by slowing down the growth of cancer cells. This medicine may be used for other purposes; ask your health care provider or pharmacist if you have questions. COMMON BRAND NAME(S): Adrucil What should I tell my care team before I take this medication? They need to know if you have any of these conditions: Blood disorders Dihydropyrimidine dehydrogenase (DPD) deficiency Infection, such as chickenpox, cold sores, herpes Kidney disease Liver disease Poor nutrition Recent or ongoing radiation therapy An unusual or allergic reaction to fluorouracil, other medications, foods, dyes, or preservatives If you or your partner are pregnant or trying to get  pregnant Breast-feeding How should I use this medication? This medication is injected into a vein. It is administered by your care team in a hospital or clinic setting. Talk to your care team about the use of this medication in children. Special care may be needed. Overdosage: If you think you have taken too much of this medicine contact a poison control center or emergency room at once. NOTE: This medicine is only for you. Do not share this medicine with others. What if I miss a dose? Keep appointments for follow-up doses. It is important not to miss your dose. Call your care team if you are unable to keep an appointment. What may interact with this medication? Do not take this medication with any of the following: Live virus vaccines This medication may also interact with the following: Medications that treat or prevent blood clots, such as warfarin, enoxaparin, dalteparin This list may not describe all possible interactions. Give your health care provider a list of all the medicines, herbs, non-prescription drugs, or dietary supplements you use. Also tell them if you smoke, drink alcohol, or use illegal drugs. Some items may interact with your  medicine. What should I watch for while using this medication? Your condition will be monitored carefully while you are receiving this medication. This medication may make you feel generally unwell. This is not uncommon as chemotherapy can affect healthy cells as well as cancer cells. Report any side effects. Continue your course of treatment even though you feel ill unless your care team tells you to stop. In some cases, you may be given additional medications to help with side effects. Follow all directions for their use. This medication may increase your risk of getting an infection. Call your care team for advice if you get a fever, chills, sore throat, or other symptoms of a cold or flu. Do not treat yourself. Try to avoid being around people who are  sick. This medication may increase your risk to bruise or bleed. Call your care team if you notice any unusual bleeding. Be careful brushing or flossing your teeth or using a toothpick because you may get an infection or bleed more easily. If you have any dental work done, tell your dentist you are receiving this medication. Avoid taking medications that contain aspirin, acetaminophen, ibuprofen, naproxen, or ketoprofen unless instructed by your care team. These medications may hide a fever. Do not treat diarrhea with over the counter products. Contact your care team if you have diarrhea that lasts more than 2 days or if it is severe and watery. This medication can make you more sensitive to the sun. Keep out of the sun. If you cannot avoid being in the sun, wear protective clothing and sunscreen. Do not use sun lamps, tanning beds, or tanning booths. Talk to your care team if you or your partner wish to become pregnant or think you might be pregnant. This medication can cause serious birth defects if taken during pregnancy and for 3 months after the last dose. A reliable form of contraception is recommended while taking this medication and for 3 months after the last dose. Talk to your care team about effective forms of contraception. Do not father a child while taking this medication and for 3 months after the last dose. Use a condom while having sex during this time period. Do not breastfeed while taking this medication. This medication may cause infertility. Talk to your care team if you are concerned about your fertility. What side effects may I notice from receiving this medication? Side effects that you should report to your care team as soon as possible: Allergic reactions--skin rash, itching, hives, swelling of the face, lips, tongue, or throat Heart attack--pain or tightness in the chest, shoulders, arms, or jaw, nausea, shortness of breath, cold or clammy skin, feeling faint or  lightheaded Heart failure--shortness of breath, swelling of the ankles, feet, or hands, sudden weight gain, unusual weakness or fatigue Heart rhythm changes--fast or irregular heartbeat, dizziness, feeling faint or lightheaded, chest pain, trouble breathing High ammonia level--unusual weakness or fatigue, confusion, loss of appetite, nausea, vomiting, seizures Infection--fever, chills, cough, sore throat, wounds that don't heal, pain or trouble when passing urine, general feeling of discomfort or being unwell Low red blood cell level--unusual weakness or fatigue, dizziness, headache, trouble breathing Pain, tingling, or numbness in the hands or feet, muscle weakness, change in vision, confusion or trouble speaking, loss of balance or coordination, trouble walking, seizures Redness, swelling, and blistering of the skin over hands and feet Severe or prolonged diarrhea Unusual bruising or bleeding Side effects that usually do not require medical attention (report to your care team if   they continue or are bothersome): Dry skin Headache Increased tears Nausea Pain, redness, or swelling with sores inside the mouth or throat Sensitivity to light Vomiting This list may not describe all possible side effects. Call your doctor for medical advice about side effects. You may report side effects to FDA at 1-800-FDA-1088. Where should I keep my medication? This medication is given in a hospital or clinic. It will not be stored at home. NOTE: This sheet is a summary. It may not cover all possible information. If you have questions about this medicine, talk to your doctor, pharmacist, or health care provider.  2024 Elsevier/Gold Standard (2021-10-24 00:00:00)  The chemotherapy medication bag should finish at 46 hours, 96 hours, or 7 days. For example, if your pump is scheduled for 46 hours and it was put on at 4:00 p.m., it should finish at 2:00 p.m. the day it is scheduled to come off regardless of your  appointment time.     Estimated time to finish at 10:45 a.m. on Friday 01/04/2023.   If the display on your pump reads "Low Volume" and it is beeping, take the batteries out of the pump and come to the cancer center for it to be taken off.   If the pump alarms go off prior to the pump reading "Low Volume" then call [phone] and someone can assist you.  If the plunger comes out and the chemotherapy medication is leaking out, please use your home chemo spill kit to clean up the spill. Do NOT use paper towels or other household products.  If you have problems or questions regarding your pump, please call either [phone] (24 hours a day) or the cancer center Monday-Friday 8:00 a.m.- 4:30 p.m. at the clinic number and we will assist you. If you are unable to get assistance, then go to the nearest Emergency Department and ask the staff to contact the IV team for assistance.

## 2023-01-02 NOTE — Progress Notes (Signed)
Patient seen by Dr. Sherrill today ? ?Vitals are within treatment parameters. ? ?Labs reviewed by Dr. Sherrill and are within treatment parameters. ? ?Per physician team, patient is ready for treatment and there are NO modifications to the treatment plan.  ?

## 2023-01-04 ENCOUNTER — Encounter: Payer: Self-pay | Admitting: Oncology

## 2023-01-04 ENCOUNTER — Inpatient Hospital Stay: Payer: BC Managed Care – PPO

## 2023-01-04 VITALS — BP 135/87 | HR 65 | Temp 98.1°F | Resp 20

## 2023-01-04 DIAGNOSIS — Z5111 Encounter for antineoplastic chemotherapy: Secondary | ICD-10-CM | POA: Diagnosis not present

## 2023-01-04 DIAGNOSIS — C184 Malignant neoplasm of transverse colon: Secondary | ICD-10-CM

## 2023-01-04 MED ORDER — SODIUM CHLORIDE 0.9% FLUSH
10.0000 mL | INTRAVENOUS | Status: DC | PRN
  Administered 2023-01-04: 10 mL

## 2023-01-04 MED ORDER — HEPARIN SOD (PORK) LOCK FLUSH 100 UNIT/ML IV SOLN
500.0000 [IU] | Freq: Once | INTRAVENOUS | Status: AC | PRN
  Administered 2023-01-04: 500 [IU]

## 2023-01-04 NOTE — Patient Instructions (Signed)

## 2023-01-13 ENCOUNTER — Other Ambulatory Visit: Payer: Self-pay | Admitting: Oncology

## 2023-01-16 ENCOUNTER — Inpatient Hospital Stay: Payer: BC Managed Care – PPO

## 2023-01-16 ENCOUNTER — Encounter: Payer: Self-pay | Admitting: Nurse Practitioner

## 2023-01-16 ENCOUNTER — Inpatient Hospital Stay (HOSPITAL_BASED_OUTPATIENT_CLINIC_OR_DEPARTMENT_OTHER): Payer: BC Managed Care – PPO | Admitting: Nurse Practitioner

## 2023-01-16 VITALS — BP 136/88 | HR 66 | Temp 98.2°F | Resp 18 | Ht 64.0 in | Wt 191.8 lb

## 2023-01-16 VITALS — BP 133/99 | HR 61 | Resp 18

## 2023-01-16 DIAGNOSIS — C184 Malignant neoplasm of transverse colon: Secondary | ICD-10-CM | POA: Diagnosis not present

## 2023-01-16 DIAGNOSIS — Z5111 Encounter for antineoplastic chemotherapy: Secondary | ICD-10-CM | POA: Diagnosis not present

## 2023-01-16 LAB — CBC WITH DIFFERENTIAL (CANCER CENTER ONLY)
Abs Immature Granulocytes: 0 10*3/uL (ref 0.00–0.07)
Basophils Absolute: 0 10*3/uL (ref 0.0–0.1)
Basophils Relative: 1 %
Eosinophils Absolute: 0.2 10*3/uL (ref 0.0–0.5)
Eosinophils Relative: 4 %
HCT: 37.6 % — ABNORMAL LOW (ref 39.0–52.0)
Hemoglobin: 12 g/dL — ABNORMAL LOW (ref 13.0–17.0)
Immature Granulocytes: 0 %
Lymphocytes Relative: 33 %
Lymphs Abs: 1.1 10*3/uL (ref 0.7–4.0)
MCH: 26.4 pg (ref 26.0–34.0)
MCHC: 31.9 g/dL (ref 30.0–36.0)
MCV: 82.8 fL (ref 80.0–100.0)
Monocytes Absolute: 0.4 10*3/uL (ref 0.1–1.0)
Monocytes Relative: 12 %
Neutro Abs: 1.7 10*3/uL (ref 1.7–7.7)
Neutrophils Relative %: 50 %
Platelet Count: 142 10*3/uL — ABNORMAL LOW (ref 150–400)
RBC: 4.54 MIL/uL (ref 4.22–5.81)
RDW: 23.8 % — ABNORMAL HIGH (ref 11.5–15.5)
WBC Count: 3.4 10*3/uL — ABNORMAL LOW (ref 4.0–10.5)
nRBC: 0 % (ref 0.0–0.2)

## 2023-01-16 LAB — CMP (CANCER CENTER ONLY)
ALT: 17 U/L (ref 0–44)
AST: 17 U/L (ref 15–41)
Albumin: 4.2 g/dL (ref 3.5–5.0)
Alkaline Phosphatase: 63 U/L (ref 38–126)
Anion gap: 7 (ref 5–15)
BUN: 12 mg/dL (ref 6–20)
CO2: 29 mmol/L (ref 22–32)
Calcium: 9.4 mg/dL (ref 8.9–10.3)
Chloride: 102 mmol/L (ref 98–111)
Creatinine: 0.93 mg/dL (ref 0.61–1.24)
GFR, Estimated: >60 mL/min (ref >60)
Glucose, Bld: 115 mg/dL — ABNORMAL HIGH (ref 70–99)
Potassium: 4 mmol/L (ref 3.5–5.1)
Sodium: 138 mmol/L (ref 135–145)
Total Bilirubin: 0.5 mg/dL (ref 0.3–1.2)
Total Protein: 6.6 g/dL (ref 6.5–8.1)

## 2023-01-16 MED ORDER — FLUOROURACIL CHEMO INJECTION 2.5 GM/50ML
400.0000 mg/m2 | Freq: Once | INTRAVENOUS | Status: AC
  Administered 2023-01-16: 800 mg via INTRAVENOUS
  Filled 2023-01-16: qty 16

## 2023-01-16 MED ORDER — PALONOSETRON HCL INJECTION 0.25 MG/5ML
0.2500 mg | Freq: Once | INTRAVENOUS | Status: AC
  Administered 2023-01-16: 0.25 mg via INTRAVENOUS
  Filled 2023-01-16: qty 5

## 2023-01-16 MED ORDER — OXALIPLATIN CHEMO INJECTION 100 MG/20ML
85.0000 mg/m2 | Freq: Once | INTRAVENOUS | Status: AC
  Administered 2023-01-16: 165 mg via INTRAVENOUS
  Filled 2023-01-16: qty 33

## 2023-01-16 MED ORDER — SODIUM CHLORIDE 0.9 % IV SOLN
10.0000 mg | Freq: Once | INTRAVENOUS | Status: AC
  Administered 2023-01-16: 10 mg via INTRAVENOUS
  Filled 2023-01-16: qty 10

## 2023-01-16 MED ORDER — DEXTROSE 5 % IV SOLN: Freq: Once | INTRAVENOUS | Status: AC

## 2023-01-16 MED ORDER — SODIUM CHLORIDE 0.9 % IV SOLN
2400.0000 mg/m2 | INTRAVENOUS | Status: DC
  Administered 2023-01-16: 5000 mg via INTRAVENOUS
  Filled 2023-01-16: qty 100

## 2023-01-16 MED ORDER — LEUCOVORIN CALCIUM INJECTION 350 MG
400.0000 mg/m2 | Freq: Once | INTRAVENOUS | Status: AC
  Administered 2023-01-16: 788 mg via INTRAVENOUS
  Filled 2023-01-16: qty 39.4

## 2023-01-16 MED ORDER — SODIUM CHLORIDE 0.9% FLUSH: 10.0000 mL | INTRAVENOUS | Status: DC | PRN

## 2023-01-16 NOTE — Progress Notes (Signed)
 Patient seen by Lisa Thomas NP today  Vitals are within treatment parameters.  Labs reviewed by Lisa Thomas NP and are within treatment parameters.  Per physician team, patient is ready for treatment and there are NO modifications to the treatment plan.     

## 2023-01-16 NOTE — Patient Instructions (Addendum)
[facility]  The chemotherapy medication bag should finish at 46 hours, 96 hours, or 7 days. For example, if your pump is scheduled for 46 hours and it was put on at 4:00 p.m., it should finish at 2:00 p.m. the day it is scheduled to come off regardless of your appointment time.     Estimated time to finish at 11:00 Friday, January 18, 2023.   If the display on your pump reads "Low Volume" and it is beeping, take the batteries out of the pump and come to the cancer center for it to be taken off.   If the pump alarms go off prior to the pump reading "Low Volume" then call [phone] and someone can assist you.  If the plunger comes out and the chemotherapy medication is leaking out, please use your home chemo spill kit to clean up the spill. Do NOT use paper towels or other household products.  If you have problems or questions regarding your pump, please call either [phone] (24 hours a day) or the cancer center Monday-Friday 8:00 a.m.- 4:30 p.m. at the clinic number and we will assist you. If you are unable to get assistance, then go to the nearest Emergency Department and ask the staff to contact the IV team for assistance.   Discharge Instructions: Thank you for choosing [facility] to provide your oncology and hematology care.   If you have a lab appointment with the Cancer Center, please go directly to the Cancer Center and check in at the registration area.   Wear comfortable clothing and clothing appropriate for easy access to any Portacath or PICC line.   We strive to give you quality time with your provider. You may need to reschedule your appointment if you arrive late (15 or more minutes).  Arriving late affects you and other patients whose appointments are after yours.  Also, if you miss three or more appointments without notifying the office, you may be dismissed from the clinic at the provider's discretion.      For  prescription refill requests, have your pharmacy contact our office and allow 72 hours for refills to be completed.    Today you received the following chemotherapy and/or immunotherapy agents Oxaliplatin, Leucovorin, Fluorouracil.      To help prevent nausea and vomiting after your treatment, we encourage you to take your nausea medication as directed.  BELOW ARE SYMPTOMS THAT SHOULD BE REPORTED IMMEDIATELY: *FEVER GREATER THAN 100.4 F (38 C) OR HIGHER *CHILLS OR SWEATING *NAUSEA AND VOMITING THAT IS NOT CONTROLLED WITH YOUR NAUSEA MEDICATION *UNUSUAL SHORTNESS OF BREATH *UNUSUAL BRUISING OR BLEEDING *URINARY PROBLEMS (pain or burning when urinating, or frequent urination) *BOWEL PROBLEMS (unusual diarrhea, constipation, pain near the anus) TENDERNESS IN MOUTH AND THROAT WITH OR WITHOUT PRESENCE OF ULCERS (sore throat, sores in mouth, or a toothache) UNUSUAL RASH, SWELLING OR PAIN  UNUSUAL VAGINAL DISCHARGE OR ITCHING   Items with * indicate a potential emergency and should be followed up as soon as possible or go to the Emergency Department if any problems should occur.  Please show the CHEMOTHERAPY ALERT CARD or IMMUNOTHERAPY ALERT CARD at check-in to the Emergency Department and triage nurse.  Should you have questions after your visit or need to cancel or reschedule your appointment, please contact [facility]  Dept: [phone]  and follow the prompts.  Office hours are 8:00 a.m. to 4:30 p.m. Monday - Friday. Please note  that voicemails left after 4:00 p.m. may not be returned until the following business day.  We are closed weekends and major holidays. You have access to a nurse at all times for urgent questions. Please call the main number to the clinic Dept: [phone] and follow the prompts.   For any non-urgent questions, you may also contact your provider using MyChart. We now offer e-Visits for anyone 20 and older to request care online  for non-urgent symptoms. For details visit [email or web address].   Also download the MyChart app! Go to the app store, search "MyChart", open the app, select [facility], and log in with your MyChart username and password.  Oxaliplatin Injection What is this medication? OXALIPLATIN (ox AL i PLA tin) treats colorectal cancer. It works by slowing down the growth of cancer cells. This medicine may be used for other purposes; ask your health care provider or pharmacist if you have questions. COMMON BRAND NAME(S): Eloxatin What should I tell my care team before I take this medication? They need to know if you have any of these conditions: Heart disease History of irregular heartbeat or rhythm Liver disease Low blood cell levels (Kendzierski cells, red cells, and platelets) Lung or breathing disease, such as asthma Take medications that treat or prevent blood clots Tingling of the fingers, toes, or other nerve disorder An unusual or allergic reaction to oxaliplatin, other medications, foods, dyes, or preservatives If you or your partner are pregnant or trying to get pregnant Breast-feeding How should I use this medication? This medication is injected into a vein. It is given by your care team in a hospital or clinic setting. Talk to your care team about the use of this medication in children. Special care may be needed. Overdosage: If you think you have taken too much of this medicine contact a poison control center or emergency room at once. NOTE: This medicine is only for you. Do not share this medicine with others. What if I miss a dose? Keep appointments for follow-up doses. It is important not to miss a dose. Call your care team if you are unable to keep an appointment. What may interact with this medication? Do not take this medication with any of the following: Cisapride Dronedarone Pimozide Thioridazine This medication may also interact with the following: Aspirin and aspirin-like  medications Certain medications that treat or prevent blood clots, such as warfarin, apixaban, dabigatran, and rivaroxaban Cisplatin Cyclosporine Diuretics Medications for infection, such as acyclovir, adefovir, amphotericin B, bacitracin, cidofovir, foscarnet, ganciclovir, gentamicin, pentamidine, vancomycin NSAIDs, medications for pain and inflammation, such as ibuprofen or naproxen Other medications that cause heart rhythm changes Pamidronate Zoledronic acid This list may not describe all possible interactions. Give your health care provider a list of all the medicines, herbs, non-prescription drugs, or dietary supplements you use. Also tell them if you smoke, drink alcohol, or use illegal drugs. Some items may interact with your medicine. What should I watch for while using this medication? Your condition will be monitored carefully while you are receiving this medication. You may need blood work while taking this medication. This medication may make you feel generally unwell. This is not uncommon as chemotherapy can affect healthy cells as well as cancer cells. Report any side effects. Continue your course of treatment even though you feel ill unless your care team tells you to stop. This medication may increase your risk of getting an infection. Call your care team for advice if you get a fever, chills, sore throat, or  other symptoms of a cold or flu. Do not treat yourself. Try to avoid being around people who are sick. Avoid taking medications that contain aspirin, acetaminophen, ibuprofen, naproxen, or ketoprofen unless instructed by your care team. These medications may hide a fever. Be careful brushing or flossing your teeth or using a toothpick because you may get an infection or bleed more easily. If you have any dental work done, tell your dentist you are receiving this medication. This medication can make you more sensitive to cold. Do not drink cold drinks or use ice. Cover exposed  skin before coming in contact with cold temperatures or cold objects. When out in cold weather wear warm clothing and cover your mouth and nose to warm the air that goes into your lungs. Tell your care team if you get sensitive to the cold. Talk to your care team if you or your partner are pregnant or think either of you might be pregnant. This medication can cause serious birth defects if taken during pregnancy and for 9 months after the last dose. A negative pregnancy test is required before starting this medication. A reliable form of contraception is recommended while taking this medication and for 9 months after the last dose. Talk to your care team about effective forms of contraception. Do not father a child while taking this medication and for 6 months after the last dose. Use a condom while having sex during this time period. Do not breastfeed while taking this medication and for 3 months after the last dose. This medication may cause infertility. Talk to your care team if you are concerned about your fertility. What side effects may I notice from receiving this medication? Side effects that you should report to your care team as soon as possible: Allergic reactions--skin rash, itching, hives, swelling of the face, lips, tongue, or throat Bleeding--bloody or black, tar-like stools, vomiting blood or brown material that looks like coffee grounds, red or dark brown urine, small red or purple spots on skin, unusual bruising or bleeding Dry cough, shortness of breath or trouble breathing Heart rhythm changes--fast or irregular heartbeat, dizziness, feeling faint or lightheaded, chest pain, trouble breathing Infection--fever, chills, cough, sore throat, wounds that don't heal, pain or trouble when passing urine, general feeling of discomfort or being unwell Liver injury--right upper belly pain, loss of appetite, nausea, light-colored stool, dark yellow or brown urine, yellowing skin or eyes, unusual  weakness or fatigue Low red blood cell level--unusual weakness or fatigue, dizziness, headache, trouble breathing Muscle injury--unusual weakness or fatigue, muscle pain, dark yellow or brown urine, decrease in amount of urine Pain, tingling, or numbness in the hands or feet Sudden and severe headache, confusion, change in vision, seizures, which may be signs of posterior reversible encephalopathy syndrome (PRES) Unusual bruising or bleeding Side effects that usually do not require medical attention (report to your care team if they continue or are bothersome): Diarrhea Nausea Pain, redness, or swelling with sores inside the mouth or throat Unusual weakness or fatigue Vomiting This list may not describe all possible side effects. Call your doctor for medical advice about side effects. You may report side effects to FDA at 1-800-FDA-1088. Where should I keep my medication? This medication is given in a hospital or clinic. It will not be stored at home. NOTE: This sheet is a summary. It may not cover all possible information. If you have questions about this medicine, talk to your doctor, pharmacist, or health care provider.  2024 Elsevier/Gold Standard (  2022-08-26 00:00:00) Leucovorin Injection What is this medication? LEUCOVORIN (loo koe VOR in) prevents side effects from certain medications, such as methotrexate. It works by increasing folate levels. This helps protect healthy cells in your body. It may also be used to treat anemia caused by low levels of folate. It can also be used with fluorouracil, a type of chemotherapy, to treat colorectal cancer. It works by increasing the effects of fluorouracil in the body. This medicine may be used for other purposes; ask your health care provider or pharmacist if you have questions. What should I tell my care team before I take this medication? They need to know if you have any of these conditions: Anemia from low levels of vitamin B12 in the  blood An unusual or allergic reaction to leucovorin, folic acid, other medications, foods, dyes, or preservatives Pregnant or trying to get pregnant Breastfeeding How should I use this medication? This medication is injected into a vein or a muscle. It is given by your care team in a hospital or clinic setting. Talk to your care team about the use of this medication in children. Special care may be needed. Overdosage: If you think you have taken too much of this medicine contact a poison control center or emergency room at once. NOTE: This medicine is only for you. Do not share this medicine with others. What if I miss a dose? Keep appointments for follow-up doses. It is important not to miss your dose. Call your care team if you are unable to keep an appointment. What may interact with this medication? Capecitabine Fluorouracil Phenobarbital Phenytoin Primidone Trimethoprim;sulfamethoxazole This list may not describe all possible interactions. Give your health care provider a list of all the medicines, herbs, non-prescription drugs, or dietary supplements you use. Also tell them if you smoke, drink alcohol, or use illegal drugs. Some items may interact with your medicine. What should I watch for while using this medication? Your condition will be monitored carefully while you are receiving this medication. This medication may increase the side effects of 5-fluorouracil. Tell your care team if you have diarrhea or mouth sores that do not get better or that get worse. What side effects may I notice from receiving this medication? Side effects that you should report to your care team as soon as possible: Allergic reactions--skin rash, itching, hives, swelling of the face, lips, tongue, or throat This list may not describe all possible side effects. Call your doctor for medical advice about side effects. You may report side effects to FDA at 1-800-FDA-1088. Where should I keep my  medication? This medication is given in a hospital or clinic. It will not be stored at home. NOTE: This sheet is a summary. It may not cover all possible information. If you have questions about this medicine, talk to your doctor, pharmacist, or health care provider.  2024 Elsevier/Gold Standard (2021-11-21 00:00:00) Fluorouracil Injection What is this medication? FLUOROURACIL (flure oh YOOR a sil) treats some types of cancer. It works by slowing down the growth of cancer cells. This medicine may be used for other purposes; ask your health care provider or pharmacist if you have questions. COMMON BRAND NAME(S): Adrucil What should I tell my care team before I take this medication? They need to know if you have any of these conditions: Blood disorders Dihydropyrimidine dehydrogenase (DPD) deficiency Infection, such as chickenpox, cold sores, herpes Kidney disease Liver disease Poor nutrition Recent or ongoing radiation therapy An unusual or allergic reaction to fluorouracil, other  medications, foods, dyes, or preservatives If you or your partner are pregnant or trying to get pregnant Breast-feeding How should I use this medication? This medication is injected into a vein. It is administered by your care team in a hospital or clinic setting. Talk to your care team about the use of this medication in children. Special care may be needed. Overdosage: If you think you have taken too much of this medicine contact a poison control center or emergency room at once. NOTE: This medicine is only for you. Do not share this medicine with others. What if I miss a dose? Keep appointments for follow-up doses. It is important not to miss your dose. Call your care team if you are unable to keep an appointment. What may interact with this medication? Do not take this medication with any of the following: Live virus vaccines This medication may also interact with the following: Medications that treat or  prevent blood clots, such as warfarin, enoxaparin, dalteparin This list may not describe all possible interactions. Give your health care provider a list of all the medicines, herbs, non-prescription drugs, or dietary supplements you use. Also tell them if you smoke, drink alcohol, or use illegal drugs. Some items may interact with your medicine. What should I watch for while using this medication? Your condition will be monitored carefully while you are receiving this medication. This medication may make you feel generally unwell. This is not uncommon as chemotherapy can affect healthy cells as well as cancer cells. Report any side effects. Continue your course of treatment even though you feel ill unless your care team tells you to stop. In some cases, you may be given additional medications to help with side effects. Follow all directions for their use. This medication may increase your risk of getting an infection. Call your care team for advice if you get a fever, chills, sore throat, or other symptoms of a cold or flu. Do not treat yourself. Try to avoid being around people who are sick. This medication may increase your risk to bruise or bleed. Call your care team if you notice any unusual bleeding. Be careful brushing or flossing your teeth or using a toothpick because you may get an infection or bleed more easily. If you have any dental work done, tell your dentist you are receiving this medication. Avoid taking medications that contain aspirin, acetaminophen, ibuprofen, naproxen, or ketoprofen unless instructed by your care team. These medications may hide a fever. Do not treat diarrhea with over the counter products. Contact your care team if you have diarrhea that lasts more than 2 days or if it is severe and watery. This medication can make you more sensitive to the sun. Keep out of the sun. If you cannot avoid being in the sun, wear protective clothing and sunscreen. Do not use sun lamps,  tanning beds, or tanning booths. Talk to your care team if you or your partner wish to become pregnant or think you might be pregnant. This medication can cause serious birth defects if taken during pregnancy and for 3 months after the last dose. A reliable form of contraception is recommended while taking this medication and for 3 months after the last dose. Talk to your care team about effective forms of contraception. Do not father a child while taking this medication and for 3 months after the last dose. Use a condom while having sex during this time period. Do not breastfeed while taking this medication. This medication may cause  infertility. Talk to your care team if you are concerned about your fertility. What side effects may I notice from receiving this medication? Side effects that you should report to your care team as soon as possible: Allergic reactions--skin rash, itching, hives, swelling of the face, lips, tongue, or throat Heart attack--pain or tightness in the chest, shoulders, arms, or jaw, nausea, shortness of breath, cold or clammy skin, feeling faint or lightheaded Heart failure--shortness of breath, swelling of the ankles, feet, or hands, sudden weight gain, unusual weakness or fatigue Heart rhythm changes--fast or irregular heartbeat, dizziness, feeling faint or lightheaded, chest pain, trouble breathing High ammonia level--unusual weakness or fatigue, confusion, loss of appetite, nausea, vomiting, seizures Infection--fever, chills, cough, sore throat, wounds that don't heal, pain or trouble when passing urine, general feeling of discomfort or being unwell Low red blood cell level--unusual weakness or fatigue, dizziness, headache, trouble breathing Pain, tingling, or numbness in the hands or feet, muscle weakness, change in vision, confusion or trouble speaking, loss of balance or coordination, trouble walking, seizures Redness, swelling, and blistering of the skin over hands and  feet Severe or prolonged diarrhea Unusual bruising or bleeding Side effects that usually do not require medical attention (report to your care team if they continue or are bothersome): Dry skin Headache Increased tears Nausea Pain, redness, or swelling with sores inside the mouth or throat Sensitivity to light Vomiting This list may not describe all possible side effects. Call your doctor for medical advice about side effects. You may report side effects to FDA at 1-800-FDA-1088. Where should I keep my medication? This medication is given in a hospital or clinic. It will not be stored at home. NOTE: This sheet is a summary. It may not cover all possible information. If you have questions about this medicine, talk to your doctor, pharmacist, or health care provider.  2024 Elsevier/Gold Standard (2021-10-24 00:00:00)

## 2023-01-16 NOTE — Progress Notes (Signed)
[facility] OFFICE PROGRESS NOTE   Diagnosis: Colon cancer  INTERVAL HISTORY:   Mr. Sudbeck returns as scheduled.  He completed cycle 2 FOLFOX 01/02/2023.  He noted fatigue for about 2 days after treatment.  Few episodes of mild nausea.  Phenergan relieves the nausea.  No mouth sores.  Diarrhea for 2 days, controlled with Imodium.  Cold sensitivity lasted 6 days.  No persistent neuropathy symptoms.  Objective:  Vital signs in last 24 hours:  Blood pressure 136/88, pulse 66, temperature 98.2 F (36.8 C), temperature source Oral, resp. rate 18, height 5\' 4"  (1.626 m), weight 191 lb 12.8 oz (87 kg), SpO2 100%.    HEENT: No thrush or ulcers. Resp: Lungs clear bilaterally. Cardio: Regular rate and rhythm. GI: Abdomen soft and nontender.  No hepatosplenomegaly. Vascular: No leg edema.   Skin: Palms without erythema. 40 cath without erythema.  Lab Results:  Lab Results  Component Value Date   WBC 3.4 (L) 01/16/2023   HGB 12.0 (L) 01/16/2023   HCT 37.6 (L) 01/16/2023   MCV 82.8 01/16/2023   PLT 142 (L) 01/16/2023   NEUTROABS 1.7 01/16/2023    Imaging:  No results found.  Medications: I have reviewed the patient's current medications.  Assessment/Plan: Colon cancer, stage IIIb (pT4a, pN1a) Colonoscopy 10/10/2022 -circumferential fungating mass thought to represent the proximal descending colon at 70 cm from the anus that could not be passed with the scope.  Biopsy showed adenocarcinoma, at least intramucosal; intact expression of all 4 mismatch repair proteins.   CT scans 10/17/2022-2 noncalcified nodules adjacent to the horizontal fissure within the right thorax between the right upper lobe and right middle lobe measuring 11 and 7 mm in size; no other pulmonary nodules; 10 mm right paratracheal lymph node liver was negative for suspicious findings; spleen with 2 small densities; a constricting lesion of the distal transverse colon suspected seen to the left of midline  adjacent to the splenic flexure measuring 3.6 cm in length; significant narrowing of the lumen of the bowel at this location with bowel wall thickening; hazy stranding within the adjacent mesenteric fat; along the superior margin of the lesion and exophytic 12 mm density possibly representing local invasion and/or adjacent adenopathy.   CEA 10/17/2022 3.0 (normal range for non-smokers less than 3.0, smokers less than 5.0) Laparoscopic extended right hemicolectomy 11/05/2022 by Dr. Byrd Hesselbach.  Final pathology showed 6 cm invasive moderately differentiated adenocarcinoma in the distal transverse colon, invading the visceral peritoneum; resection margins negative; positive lymphovascular and perineural invasion; 1 out of 26 lymph nodes positive for carcinoma; no tumor deposits identified; appendiceal serrated lesion with low-grade dysplasia; pT4a, pN1a Cycle 1 FOLFOX 12/19/2022 Cycle 2 FOLFOX 01/02/2023 Cycle 3 FOLFOX 01/16/2023   Iron deficiency anemia-he reports receiving IV iron prior to the colon surgery 12/18/2022 ferritin 11, hemoglobin 11.1/MCV 79 12/18/2022 ferrous sulfate 1 tablet daily Hypertension History of kidney stones Colonoscopy 10/10/2022-mass could not be passed with the scope Port-A-Cath placement, Interventional Radiology, 12/17/2022  Disposition: Mr. Cryder appears stable.  He has completed 2 cycles of FOLFOX.  Overall seems to be tolerating well.  Plan proceed with cycle 3 today as scheduled.  CBC and chemistry panel reviewed.  Labs adequate to proceed with treatment.  Neutrophil count is in low normal range.  He has mild thrombocytopenia.  He understands to contact the office with fever, chills, other signs of infection, bleeding.  He will return for follow-up and cycle 4 FOLFOX in 2 weeks.    Lonna Cobb  ANP/GNP-BC   01/16/2023  8:56 AM

## 2023-01-17 ENCOUNTER — Other Ambulatory Visit: Payer: Self-pay

## 2023-01-18 ENCOUNTER — Inpatient Hospital Stay: Payer: BC Managed Care – PPO

## 2023-01-18 VITALS — BP 140/90 | HR 71 | Temp 97.5°F | Resp 18

## 2023-01-18 DIAGNOSIS — C184 Malignant neoplasm of transverse colon: Secondary | ICD-10-CM

## 2023-01-18 DIAGNOSIS — Z5111 Encounter for antineoplastic chemotherapy: Secondary | ICD-10-CM | POA: Diagnosis not present

## 2023-01-18 MED ORDER — HEPARIN SOD (PORK) LOCK FLUSH 100 UNIT/ML IV SOLN
500.0000 [IU] | Freq: Once | INTRAVENOUS | Status: AC | PRN
  Administered 2023-01-18: 500 [IU]

## 2023-01-18 MED ORDER — SODIUM CHLORIDE 0.9% FLUSH
10.0000 mL | INTRAVENOUS | Status: DC | PRN
  Administered 2023-01-18: 10 mL

## 2023-01-18 NOTE — Patient Instructions (Signed)

## 2023-01-21 ENCOUNTER — Other Ambulatory Visit: Payer: Self-pay | Admitting: Nurse Practitioner

## 2023-01-21 DIAGNOSIS — C184 Malignant neoplasm of transverse colon: Secondary | ICD-10-CM

## 2023-01-27 ENCOUNTER — Other Ambulatory Visit: Payer: Self-pay | Admitting: Oncology

## 2023-01-30 ENCOUNTER — Encounter: Payer: Self-pay | Admitting: *Deleted

## 2023-01-30 ENCOUNTER — Inpatient Hospital Stay: Payer: BC Managed Care – PPO

## 2023-01-30 ENCOUNTER — Inpatient Hospital Stay: Payer: BC Managed Care – PPO | Admitting: Oncology

## 2023-01-30 VITALS — BP 141/83 | HR 60 | Temp 98.2°F | Resp 18 | Ht 64.0 in | Wt 189.3 lb

## 2023-01-30 VITALS — BP 146/96 | HR 63 | Temp 98.0°F | Resp 18

## 2023-01-30 DIAGNOSIS — Z5111 Encounter for antineoplastic chemotherapy: Secondary | ICD-10-CM | POA: Diagnosis not present

## 2023-01-30 DIAGNOSIS — C184 Malignant neoplasm of transverse colon: Secondary | ICD-10-CM

## 2023-01-30 LAB — CBC WITH DIFFERENTIAL (CANCER CENTER ONLY)
Abs Immature Granulocytes: 0.01 10*3/uL (ref 0.00–0.07)
Basophils Absolute: 0 10*3/uL (ref 0.0–0.1)
Basophils Relative: 1 %
Eosinophils Absolute: 0.2 10*3/uL (ref 0.0–0.5)
Eosinophils Relative: 6 %
HCT: 38.7 % — ABNORMAL LOW (ref 39.0–52.0)
Hemoglobin: 12.3 g/dL — ABNORMAL LOW (ref 13.0–17.0)
Immature Granulocytes: 0 %
Lymphocytes Relative: 30 %
Lymphs Abs: 1.1 10*3/uL (ref 0.7–4.0)
MCH: 27.2 pg (ref 26.0–34.0)
MCHC: 31.8 g/dL (ref 30.0–36.0)
MCV: 85.4 fL (ref 80.0–100.0)
Monocytes Absolute: 0.4 10*3/uL (ref 0.1–1.0)
Monocytes Relative: 12 %
Neutro Abs: 1.9 10*3/uL (ref 1.7–7.7)
Neutrophils Relative %: 51 %
Platelet Count: 145 10*3/uL — ABNORMAL LOW (ref 150–400)
RBC: 4.53 MIL/uL (ref 4.22–5.81)
RDW: 24.9 % — ABNORMAL HIGH (ref 11.5–15.5)
WBC Count: 3.6 10*3/uL — ABNORMAL LOW (ref 4.0–10.5)
nRBC: 0 % (ref 0.0–0.2)

## 2023-01-30 LAB — CMP (CANCER CENTER ONLY)
ALT: 16 U/L (ref 0–44)
AST: 17 U/L (ref 15–41)
Albumin: 4.2 g/dL (ref 3.5–5.0)
Alkaline Phosphatase: 67 U/L (ref 38–126)
Anion gap: 7 (ref 5–15)
BUN: 9 mg/dL (ref 8–23)
CO2: 26 mmol/L (ref 22–32)
Calcium: 9.4 mg/dL (ref 8.9–10.3)
Chloride: 105 mmol/L (ref 98–111)
Creatinine: 0.93 mg/dL (ref 0.61–1.24)
GFR, Estimated: >60 mL/min (ref >60)
Glucose, Bld: 128 mg/dL — ABNORMAL HIGH (ref 70–99)
Potassium: 4.3 mmol/L (ref 3.5–5.1)
Sodium: 138 mmol/L (ref 135–145)
Total Bilirubin: 0.5 mg/dL (ref 0.3–1.2)
Total Protein: 6.7 g/dL (ref 6.5–8.1)

## 2023-01-30 MED ORDER — DEXTROSE 5 % IV SOLN: Freq: Once | INTRAVENOUS | Status: AC

## 2023-01-30 MED ORDER — LEUCOVORIN CALCIUM INJECTION 350 MG
400.0000 mg/m2 | Freq: Once | INTRAVENOUS | Status: AC
  Administered 2023-01-30: 788 mg via INTRAVENOUS
  Filled 2023-01-30: qty 39.4

## 2023-01-30 MED ORDER — PALONOSETRON HCL INJECTION 0.25 MG/5ML
0.2500 mg | Freq: Once | INTRAVENOUS | Status: AC
  Administered 2023-01-30: 0.25 mg via INTRAVENOUS
  Filled 2023-01-30: qty 5

## 2023-01-30 MED ORDER — FLUOROURACIL CHEMO INJECTION 2.5 GM/50ML
400.0000 mg/m2 | Freq: Once | INTRAVENOUS | Status: AC
  Administered 2023-01-30: 800 mg via INTRAVENOUS
  Filled 2023-01-30: qty 16

## 2023-01-30 MED ORDER — SODIUM CHLORIDE 0.9 % IV SOLN
2400.0000 mg/m2 | INTRAVENOUS | Status: DC
  Administered 2023-01-30: 5000 mg via INTRAVENOUS
  Filled 2023-01-30: qty 100

## 2023-01-30 MED ORDER — OXALIPLATIN CHEMO INJECTION 100 MG/20ML
85.0000 mg/m2 | Freq: Once | INTRAVENOUS | Status: AC
  Administered 2023-01-30: 165 mg via INTRAVENOUS
  Filled 2023-01-30: qty 33

## 2023-01-30 MED ORDER — SODIUM CHLORIDE 0.9% FLUSH: 10.0000 mL | INTRAVENOUS | Status: DC | PRN

## 2023-01-30 MED ORDER — SODIUM CHLORIDE 0.9 % IV SOLN
10.0000 mg | Freq: Once | INTRAVENOUS | Status: AC
  Administered 2023-01-30: 10 mg via INTRAVENOUS
  Filled 2023-01-30: qty 10

## 2023-01-30 NOTE — Progress Notes (Deleted)
Colon cancer, stage IIIb (pT4a, pN1a) Colonoscopy 10/10/2022 -circumferential fungating mass thought to represent the proximal descending colon at 70 cm from the anus that could not be passed with the scope.  Biopsy showed adenocarcinoma, at least intramucosal; intact expression of all 4 mismatch repair proteins.   CT scans 10/17/2022-2 noncalcified nodules adjacent to the horizontal fissure within the right thorax between the right upper lobe and right middle lobe measuring 11 and 7 mm in size; no other pulmonary nodules; 10 mm right paratracheal lymph node liver was negative for suspicious findings; spleen with 2 small densities; a constricting lesion of the distal transverse colon suspected seen to the left of midline adjacent to the splenic flexure measuring 3.6 cm in length; significant narrowing of the lumen of the bowel at this location with bowel wall thickening; hazy stranding within the adjacent mesenteric fat; along the superior margin of the lesion and exophytic 12 mm density possibly representing local invasion and/or adjacent adenopathy.   CEA 10/17/2022 3.0 (normal range for non-smokers less than 3.0, smokers less than 5.0) Laparoscopic extended right hemicolectomy 11/05/2022 by Dr. Byrd Hesselbach.  Final pathology showed 6 cm invasive moderately differentiated adenocarcinoma in the distal transverse colon, invading the visceral peritoneum; resection margins negative; positive lymphovascular and perineural invasion; 1 out of 26 lymph nodes positive for carcinoma; no tumor deposits identified; appendiceal serrated lesion with low-grade dysplasia; pT4a, pN1a Cycle 1 FOLFOX 12/19/2022 Cycle 2 FOLFOX 01/02/2023 Cycle 3 FOLFOX 01/16/2023   Iron deficiency anemia-he reports receiving IV iron prior to the colon surgery 12/18/2022 ferritin 11, hemoglobin 11.1/MCV 79 12/18/2022 ferrous sulfate 1 tablet daily Hypertension History of kidney stones Colonoscopy 10/10/2022-mass could not be passed with the  scope Port-A-Cath placement, Interventional Radiology, 12/17/2022

## 2023-01-30 NOTE — Progress Notes (Signed)
[facility] OFFICE PROGRESS NOTE   Diagnosis: Colon cancer  INTERVAL HISTORY:   [name] [name] completed another cycle of FOLFOX 01/16/2023.  No nausea/vomiting.  He reports mild mouth soreness without ulcers.  He had cold sensitivity lasting approximately 5 days following chemotherapy.  No neuropathy symptoms at present.  He reports 4 to 5 days of diarrhea following chemotherapy.  He has diarrhea up to twice daily.  The diarrhea occurs after eating.  Imodium partially relieves the diarrhea.  Objective:  Vital signs in last 24 hours:  Blood pressure (!) 141/83, pulse 60, temperature 98.2 F (36.8 C), temperature source Oral, resp. rate 18, height 5\' 4"  (1.626 m), weight 189 lb 4.8 oz (85.9 kg), SpO2 99%.    HEENT: No thrush or ulcers Resp: Lungs clear bilaterally Cardio: Regular rate and rhythm GI: No hepatosplenomegaly, nontender, no mass Vascular: No leg edema  Skin: Palms without erythema  Portacath/PICC-without erythema  Lab Results:  Lab Results  Component Value Date   WBC 3.6 (L) 01/30/2023   HGB 12.3 (L) 01/30/2023   HCT 38.7 (L) 01/30/2023   MCV 85.4 01/30/2023   PLT 145 (L) 01/30/2023   NEUTROABS 1.9 01/30/2023    CMP  Lab Results  Component Value Date   NA 138 01/30/2023   K 4.3 01/30/2023   CL 105 01/30/2023   CO2 26 01/30/2023   GLUCOSE 128 (H) 01/30/2023   BUN 9 01/30/2023   CREATININE 0.93 01/30/2023   CALCIUM 9.4 01/30/2023   PROT 6.7 01/30/2023   ALBUMIN 4.2 01/30/2023   AST 17 01/30/2023   ALT 16 01/30/2023   ALKPHOS 67 01/30/2023   BILITOT 0.5 01/30/2023   GFRNONAA >60 01/30/2023   GFRAA >60 04/13/2018    Lab Results  Component Value Date   CEA 1.63 12/18/2022    Medications: I have reviewed the patient's current medications.   Assessment/Plan: Colon cancer, stage IIIb (pT4a, pN1a) Colonoscopy 10/10/2022 -circumferential fungating mass thought to represent the proximal descending colon at 70 cm from the anus that could  not be passed with the scope.  Biopsy showed adenocarcinoma, at least intramucosal; intact expression of all 4 mismatch repair proteins.   CT scans 10/17/2022-2 noncalcified nodules adjacent to the horizontal fissure within the right thorax between the right upper lobe and right middle lobe measuring 11 and 7 mm in size; no other pulmonary nodules; 10 mm right paratracheal lymph node liver was negative for suspicious findings; spleen with 2 small densities; a constricting lesion of the distal transverse colon suspected seen to the left of midline adjacent to the splenic flexure measuring 3.6 cm in length; significant narrowing of the lumen of the bowel at this location with bowel wall thickening; hazy stranding within the adjacent mesenteric fat; along the superior margin of the lesion and exophytic 12 mm density possibly representing local invasion and/or adjacent adenopathy.   CEA 10/17/2022 3.0 (normal range for non-smokers less than 3.0, smokers less than 5.0) Laparoscopic extended right hemicolectomy 11/05/2022 by Dr. Byrd Hesselbach.  Final pathology showed 6 cm invasive moderately differentiated adenocarcinoma in the distal transverse colon, invading the visceral peritoneum; resection margins negative; positive lymphovascular and perineural invasion; 1 out of 26 lymph nodes positive for carcinoma; no tumor deposits identified; appendiceal serrated lesion with low-grade dysplasia; pT4a, pN1a Cycle 1 FOLFOX 12/19/2022 Cycle 2 FOLFOX 01/02/2023 Cycle 3 FOLFOX 01/16/2023 Cycle 4 FOLFOX 01/30/2023   Iron deficiency anemia-he reports receiving IV iron prior to the colon surgery 12/18/2022 ferritin 11, hemoglobin 11.1/MCV 79 12/18/2022  ferrous sulfate 1 tablet daily Hypertension History of kidney stones Colonoscopy 10/10/2022-mass could not be passed with the scope Port-A-Cath placement, Interventional Radiology, 12/17/2022    Disposition: [name] [name] appears stable.  Has completed 3 cycles of FOLFOX.  He is tolerating  the chemotherapy well.  He will complete cycle 4 today.  The neutrophil count remains mildly low today, but higher compared to when he was treated 2 weeks ago.  We will hold on G-CSF support for now.  He will return for an office visit and chemotherapy in 2 weeks.  Thornton Papas, MD  01/30/2023  9:41 AM

## 2023-01-30 NOTE — Progress Notes (Signed)
Patient seen by Dr. Truett Perna today  Vitals are within treatment parameters.  Labs reviewed by Dr. Truett Perna and are within treatment parameters.  Per physician team, patient is ready for treatment. Please note that modifications are being made to the treatment plan including Removed Udenyca from treatment today

## 2023-01-30 NOTE — Patient Instructions (Signed)
Implanted [facility] Guide An implanted port is a device that is placed under the skin. It is usually placed in the chest. The device may vary based on the need. Implanted ports can be used to give IV medicine, to take blood, or to give fluids. You may have an implanted port if: You need IV medicine that would be irritating to the small veins in your hands or arms. You need IV medicines, such as chemotherapy, for a long period of time. You need IV nutrition for a long period of time. You may have fewer limitations when using a port than you would if you used other types of long-term IVs. You will also likely be able to return to normal activities after your incision heals. An implanted port has two main parts: Reservoir. The reservoir is the part where a needle is inserted to give medicines or draw blood. The reservoir is round. After the port is placed, it appears as a small, raised area under your skin. Catheter. The catheter is a small, thin tube that connects the reservoir to a vein. Medicine that is inserted into the reservoir goes into the catheter and then into the vein. How is my port accessed? To access your port: A numbing cream may be placed on the skin over the port site. Your health care provider will put on a mask and sterile gloves. The skin over your port will be cleaned carefully with a germ-killing soap and allowed to dry. Your health care provider will gently pinch the port and insert a needle into it. Your health care provider will check for a blood return to make sure the port is in the vein and is still working (patent). If your port needs to remain accessed to get medicine continuously (constant infusion), your health care provider will place a clear bandage (dressing) over the needle site. The dressing and needle will need to be changed every week, or as told by your health care provider. What is flushing? Flushing helps keep the port working. Follow instructions from your  health care provider about how and when to flush the port. Ports are usually flushed with saline solution or a medicine called heparin. The need for flushing will depend on how the port is used: If the port is only used from time to time to give medicines or draw blood, the port may need to be flushed: Before and after medicines have been given. Before and after blood has been drawn. As part of routine maintenance. Flushing may be recommended every 4-6 weeks. If a constant infusion is running, the port may not need to be flushed. Throw away any syringes in a disposal container that is meant for sharp items (sharps container). You can buy a sharps container from a pharmacy, or you can make one by using an empty hard plastic bottle with a cover. How long will my port stay implanted? The port can stay in for as long as your health care provider thinks it is needed. When it is time for the port to come out, a surgery will be done to remove it. The surgery will be similar to the procedure that was done to put the port in. Follow these instructions at home: Caring for your port and port site Flush your port as told by your health care provider. If you need an infusion over several days, follow instructions from your health care provider about how to take care of your port site. Make sure you: Change your  dressing as told by your health care provider. Wash your hands with soap and water for at least 20 seconds before and after you change your dressing. If soap and water are not available, use alcohol-based hand sanitizer. Place any used dressings or infusion bags into a plastic bag. Throw that bag in the trash. Keep the dressing that covers the needle clean and dry. Do not get it wet. Do not use scissors or sharp objects near the infusion tubing. Keep any external tubes clamped, unless they are being used. Check your port site every day for signs of infection. Check for: Redness, swelling, or  pain. Fluid or blood. Warmth. Pus or a bad smell. Protect the skin around the port site. Avoid wearing bra straps that rub or irritate the site. Protect the skin around your port from seat belts. Place a soft pad over your chest if needed. Bathe or shower as told by your health care provider. The site may get wet as long as you are not actively receiving an infusion. General instructions  Return to your normal activities as told by your health care provider. Ask your health care provider what activities are safe for you. Carry a medical alert card or wear a medical alert bracelet at all times. This will let health care providers know that you have an implanted port in case of an emergency. Where to find more information American Cancer Society: www.cancer.org American Society of Clinical Oncology: www.cancer.net Contact a health care provider if: You have a fever or chills. You have redness, swelling, or pain at the port site. You have fluid or blood coming from your port site. Your incision feels warm to the touch. You have pus or a bad smell coming from the port site. Summary Implanted ports are usually placed in the chest for long-term IV access. Follow instructions from your health care provider about flushing the port and changing bandages (dressings). Take care of the area around your port by avoiding clothing that puts pressure on the area, and by watching for signs of infection. Protect the skin around your port from seat belts. Place a soft pad over your chest if needed. Contact a health care provider if you have a fever or you have redness, swelling, pain, fluid, or a bad smell at the port site. This information is not intended to replace advice given to you by your health care provider. Make sure you discuss any questions you have with your health care provider.  [facility] AT [facility]  The chemotherapy medication bag should finish at 46 hours, 96  hours, or 7 days. For example, if your pump is scheduled for 46 hours and it was put on at 4:00 p.m., it should finish at 2:00 p.m. the day it is scheduled to come off regardless of your appointment time.     Estimated time to finish at 11:00 Friday, 02/01/2023.   If the display on your pump reads "Low Volume" and it is beeping, take the batteries out of the pump and come to the cancer center for it to be taken off.   If the pump alarms go off prior to the pump reading "Low Volume" then call [phone] and someone can assist you.  If the plunger comes out and the chemotherapy medication is leaking out, please use your home chemo spill kit to clean up the spill. Do NOT use paper towels or other household products.  If you have problems or questions regarding your pump, please call  either [phone] (24 hours a day) or the cancer center Monday-Friday 8:00 a.m.- 4:30 p.m. at the clinic number and we will assist you. If you are unable to get assistance, then go to the nearest Emergency Department and ask the staff to contact the IV team for assistance.   Discharge Instructions: Thank you for choosing [facility] to provide your oncology and hematology care.   If you have a lab appointment with the Cancer Center, please go directly to the Cancer Center and check in at the registration area.   Wear comfortable clothing and clothing appropriate for easy access to any Portacath or PICC line.   We strive to give you quality time with your provider. You may need to reschedule your appointment if you arrive late (15 or more minutes).  Arriving late affects you and other patients whose appointments are after yours.  Also, if you miss three or more appointments without notifying the office, you may be dismissed from the clinic at the provider's discretion.      For prescription refill requests, have your pharmacy contact our office and allow 72 hours for refills to be completed.    Today  you received the following chemotherapy and/or immunotherapy agents Oxaliplatin, Leucovorin, Fluorouracil.      To help prevent nausea and vomiting after your treatment, we encourage you to take your nausea medication as directed.  BELOW ARE SYMPTOMS THAT SHOULD BE REPORTED IMMEDIATELY: *FEVER GREATER THAN 100.4 F (38 C) OR HIGHER *CHILLS OR SWEATING *NAUSEA AND VOMITING THAT IS NOT CONTROLLED WITH YOUR NAUSEA MEDICATION *UNUSUAL SHORTNESS OF BREATH *UNUSUAL BRUISING OR BLEEDING *URINARY PROBLEMS (pain or burning when urinating, or frequent urination) *BOWEL PROBLEMS (unusual diarrhea, constipation, pain near the anus) TENDERNESS IN MOUTH AND THROAT WITH OR WITHOUT PRESENCE OF ULCERS (sore throat, sores in mouth, or a toothache) UNUSUAL RASH, SWELLING OR PAIN  UNUSUAL VAGINAL DISCHARGE OR ITCHING   Items with * indicate a potential emergency and should be followed up as soon as possible or go to the Emergency Department if any problems should occur.  Please show the CHEMOTHERAPY ALERT CARD or IMMUNOTHERAPY ALERT CARD at check-in to the Emergency Department and triage nurse.  Should you have questions after your visit or need to cancel or reschedule your appointment, please contact [place] [facility]  Dept: [phone]  and follow the prompts.  Office hours are 8:00 a.m. to 4:30 p.m. Monday - Friday. Please note that voicemails left after 4:00 p.m. may not be returned until the following business day.  We are closed weekends and major holidays. You have access to a nurse at all times for urgent questions. Please call the main number to the clinic Dept: [phone] and follow the prompts.   For any non-urgent questions, you may also contact your provider using MyChart. We now offer e-Visits for anyone 47 and older to request care online for non-urgent symptoms. For details visit [email or web address].   Also download the MyChart app! Go to the app store, search  "MyChart", open the app, select [facility], and log in with your MyChart username and password.  Oxaliplatin Injection What is this medication? OXALIPLATIN (ox AL i PLA tin) treats colorectal cancer. It works by slowing down the growth of cancer cells. This medicine may be used for other purposes; ask your health care provider or pharmacist if you have questions. COMMON BRAND NAME(S): Eloxatin What should I tell my care team before I take this medication? They need  to know if you have any of these conditions: Heart disease History of irregular heartbeat or rhythm Liver disease Low blood cell levels (Howat cells, red cells, and platelets) Lung or breathing disease, such as asthma Take medications that treat or prevent blood clots Tingling of the fingers, toes, or other nerve disorder An unusual or allergic reaction to oxaliplatin, other medications, foods, dyes, or preservatives If you or your partner are pregnant or trying to get pregnant Breast-feeding How should I use this medication? This medication is injected into a vein. It is given by your care team in a hospital or clinic setting. Talk to your care team about the use of this medication in children. Special care may be needed. Overdosage: If you think you have taken too much of this medicine contact a poison control center or emergency room at once. NOTE: This medicine is only for you. Do not share this medicine with others. What if I miss a dose? Keep appointments for follow-up doses. It is important not to miss a dose. Call your care team if you are unable to keep an appointment. What may interact with this medication? Do not take this medication with any of the following: Cisapride Dronedarone Pimozide Thioridazine This medication may also interact with the following: Aspirin and aspirin-like medications Certain medications that treat or prevent blood clots, such as warfarin, apixaban, dabigatran, and  rivaroxaban Cisplatin Cyclosporine Diuretics Medications for infection, such as acyclovir, adefovir, amphotericin B, bacitracin, cidofovir, foscarnet, ganciclovir, gentamicin, pentamidine, vancomycin NSAIDs, medications for pain and inflammation, such as ibuprofen or naproxen Other medications that cause heart rhythm changes Pamidronate Zoledronic acid This list may not describe all possible interactions. Give your health care provider a list of all the medicines, herbs, non-prescription drugs, or dietary supplements you use. Also tell them if you smoke, drink alcohol, or use illegal drugs. Some items may interact with your medicine. What should I watch for while using this medication? Your condition will be monitored carefully while you are receiving this medication. You may need blood work while taking this medication. This medication may make you feel generally unwell. This is not uncommon as chemotherapy can affect healthy cells as well as cancer cells. Report any side effects. Continue your course of treatment even though you feel ill unless your care team tells you to stop. This medication may increase your risk of getting an infection. Call your care team for advice if you get a fever, chills, sore throat, or other symptoms of a cold or flu. Do not treat yourself. Try to avoid being around people who are sick. Avoid taking medications that contain aspirin, acetaminophen, ibuprofen, naproxen, or ketoprofen unless instructed by your care team. These medications may hide a fever. Be careful brushing or flossing your teeth or using a toothpick because you may get an infection or bleed more easily. If you have any dental work done, tell your dentist you are receiving this medication. This medication can make you more sensitive to cold. Do not drink cold drinks or use ice. Cover exposed skin before coming in contact with cold temperatures or cold objects. When out in cold weather wear warm clothing  and cover your mouth and nose to warm the air that goes into your lungs. Tell your care team if you get sensitive to the cold. Talk to your care team if you or your partner are pregnant or think either of you might be pregnant. This medication can cause serious birth defects if taken during pregnancy and  for 9 months after the last dose. A negative pregnancy test is required before starting this medication. A reliable form of contraception is recommended while taking this medication and for 9 months after the last dose. Talk to your care team about effective forms of contraception. Do not father a child while taking this medication and for 6 months after the last dose. Use a condom while having sex during this time period. Do not breastfeed while taking this medication and for 3 months after the last dose. This medication may cause infertility. Talk to your care team if you are concerned about your fertility. What side effects may I notice from receiving this medication? Side effects that you should report to your care team as soon as possible: Allergic reactions--skin rash, itching, hives, swelling of the face, lips, tongue, or throat Bleeding--bloody or black, tar-like stools, vomiting blood or brown material that looks like coffee grounds, red or dark brown urine, small red or purple spots on skin, unusual bruising or bleeding Dry cough, shortness of breath or trouble breathing Heart rhythm changes--fast or irregular heartbeat, dizziness, feeling faint or lightheaded, chest pain, trouble breathing Infection--fever, chills, cough, sore throat, wounds that don't heal, pain or trouble when passing urine, general feeling of discomfort or being unwell Liver injury--right upper belly pain, loss of appetite, nausea, light-colored stool, dark yellow or brown urine, yellowing skin or eyes, unusual weakness or fatigue Low red blood cell level--unusual weakness or fatigue, dizziness, headache, trouble  breathing Muscle injury--unusual weakness or fatigue, muscle pain, dark yellow or brown urine, decrease in amount of urine Pain, tingling, or numbness in the hands or feet Sudden and severe headache, confusion, change in vision, seizures, which may be signs of posterior reversible encephalopathy syndrome (PRES) Unusual bruising or bleeding Side effects that usually do not require medical attention (report to your care team if they continue or are bothersome): Diarrhea Nausea Pain, redness, or swelling with sores inside the mouth or throat Unusual weakness or fatigue Vomiting This list may not describe all possible side effects. Call your doctor for medical advice about side effects. You may report side effects to FDA at 1-800-FDA-1088. Where should I keep my medication? This medication is given in a hospital or clinic. It will not be stored at home. NOTE: This sheet is a summary. It may not cover all possible information. If you have questions about this medicine, talk to your doctor, pharmacist, or health care provider.  2024 Elsevier/Gold Standard (2022-08-26 00:00:00) Leucovorin Injection What is this medication? LEUCOVORIN (loo koe VOR in) prevents side effects from certain medications, such as methotrexate. It works by increasing folate levels. This helps protect healthy cells in your body. It may also be used to treat anemia caused by low levels of folate. It can also be used with fluorouracil, a type of chemotherapy, to treat colorectal cancer. It works by increasing the effects of fluorouracil in the body. This medicine may be used for other purposes; ask your health care provider or pharmacist if you have questions. What should I tell my care team before I take this medication? They need to know if you have any of these conditions: Anemia from low levels of vitamin B12 in the blood An unusual or allergic reaction to leucovorin, folic acid, other medications, foods, dyes, or  preservatives Pregnant or trying to get pregnant Breastfeeding How should I use this medication? This medication is injected into a vein or a muscle. It is given by your care team in a  hospital or clinic setting. Talk to your care team about the use of this medication in children. Special care may be needed. Overdosage: If you think you have taken too much of this medicine contact a poison control center or emergency room at once. NOTE: This medicine is only for you. Do not share this medicine with others. What if I miss a dose? Keep appointments for follow-up doses. It is important not to miss your dose. Call your care team if you are unable to keep an appointment. What may interact with this medication? Capecitabine Fluorouracil Phenobarbital Phenytoin Primidone Trimethoprim;sulfamethoxazole This list may not describe all possible interactions. Give your health care provider a list of all the medicines, herbs, non-prescription drugs, or dietary supplements you use. Also tell them if you smoke, drink alcohol, or use illegal drugs. Some items may interact with your medicine. What should I watch for while using this medication? Your condition will be monitored carefully while you are receiving this medication. This medication may increase the side effects of 5-fluorouracil. Tell your care team if you have diarrhea or mouth sores that do not get better or that get worse. What side effects may I notice from receiving this medication? Side effects that you should report to your care team as soon as possible: Allergic reactions--skin rash, itching, hives, swelling of the face, lips, tongue, or throat This list may not describe all possible side effects. Call your doctor for medical advice about side effects. You may report side effects to FDA at 1-800-FDA-1088. Where should I keep my medication? This medication is given in a hospital or clinic. It will not be stored at home. NOTE: This sheet is  a summary. It may not cover all possible information. If you have questions about this medicine, talk to your doctor, pharmacist, or health care provider.  2024 Elsevier/Gold Standard (2021-11-21 00:00:00) Fluorouracil Injection What is this medication? FLUOROURACIL (flure oh YOOR a sil) treats some types of cancer. It works by slowing down the growth of cancer cells. This medicine may be used for other purposes; ask your health care provider or pharmacist if you have questions. COMMON BRAND NAME(S): Adrucil What should I tell my care team before I take this medication? They need to know if you have any of these conditions: Blood disorders Dihydropyrimidine dehydrogenase (DPD) deficiency Infection, such as chickenpox, cold sores, herpes Kidney disease Liver disease Poor nutrition Recent or ongoing radiation therapy An unusual or allergic reaction to fluorouracil, other medications, foods, dyes, or preservatives If you or your partner are pregnant or trying to get pregnant Breast-feeding How should I use this medication? This medication is injected into a vein. It is administered by your care team in a hospital or clinic setting. Talk to your care team about the use of this medication in children. Special care may be needed. Overdosage: If you think you have taken too much of this medicine contact a poison control center or emergency room at once. NOTE: This medicine is only for you. Do not share this medicine with others. What if I miss a dose? Keep appointments for follow-up doses. It is important not to miss your dose. Call your care team if you are unable to keep an appointment. What may interact with this medication? Do not take this medication with any of the following: Live virus vaccines This medication may also interact with the following: Medications that treat or prevent blood clots, such as warfarin, enoxaparin, dalteparin This list may not describe all possible  interactions. Give your health care provider a list of all the medicines, herbs, non-prescription drugs, or dietary supplements you use. Also tell them if you smoke, drink alcohol, or use illegal drugs. Some items may interact with your medicine. What should I watch for while using this medication? Your condition will be monitored carefully while you are receiving this medication. This medication may make you feel generally unwell. This is not uncommon as chemotherapy can affect healthy cells as well as cancer cells. Report any side effects. Continue your course of treatment even though you feel ill unless your care team tells you to stop. In some cases, you may be given additional medications to help with side effects. Follow all directions for their use. This medication may increase your risk of getting an infection. Call your care team for advice if you get a fever, chills, sore throat, or other symptoms of a cold or flu. Do not treat yourself. Try to avoid being around people who are sick. This medication may increase your risk to bruise or bleed. Call your care team if you notice any unusual bleeding. Be careful brushing or flossing your teeth or using a toothpick because you may get an infection or bleed more easily. If you have any dental work done, tell your dentist you are receiving this medication. Avoid taking medications that contain aspirin, acetaminophen, ibuprofen, naproxen, or ketoprofen unless instructed by your care team. These medications may hide a fever. Do not treat diarrhea with over the counter products. Contact your care team if you have diarrhea that lasts more than 2 days or if it is severe and watery. This medication can make you more sensitive to the sun. Keep out of the sun. If you cannot avoid being in the sun, wear protective clothing and sunscreen. Do not use sun lamps, tanning beds, or tanning booths. Talk to your care team if you or your partner wish to become pregnant  or think you might be pregnant. This medication can cause serious birth defects if taken during pregnancy and for 3 months after the last dose. A reliable form of contraception is recommended while taking this medication and for 3 months after the last dose. Talk to your care team about effective forms of contraception. Do not father a child while taking this medication and for 3 months after the last dose. Use a condom while having sex during this time period. Do not breastfeed while taking this medication. This medication may cause infertility. Talk to your care team if you are concerned about your fertility. What side effects may I notice from receiving this medication? Side effects that you should report to your care team as soon as possible: Allergic reactions--skin rash, itching, hives, swelling of the face, lips, tongue, or throat Heart attack--pain or tightness in the chest, shoulders, arms, or jaw, nausea, shortness of breath, cold or clammy skin, feeling faint or lightheaded Heart failure--shortness of breath, swelling of the ankles, feet, or hands, sudden weight gain, unusual weakness or fatigue Heart rhythm changes--fast or irregular heartbeat, dizziness, feeling faint or lightheaded, chest pain, trouble breathing High ammonia level--unusual weakness or fatigue, confusion, loss of appetite, nausea, vomiting, seizures Infection--fever, chills, cough, sore throat, wounds that don't heal, pain or trouble when passing urine, general feeling of discomfort or being unwell Low red blood cell level--unusual weakness or fatigue, dizziness, headache, trouble breathing Pain, tingling, or numbness in the hands or feet, muscle weakness, change in vision, confusion or trouble speaking, loss of balance or coordination,  trouble walking, seizures Redness, swelling, and blistering of the skin over hands and feet Severe or prolonged diarrhea Unusual bruising or bleeding Side effects that usually do not  require medical attention (report to your care team if they continue or are bothersome): Dry skin Headache Increased tears Nausea Pain, redness, or swelling with sores inside the mouth or throat Sensitivity to light Vomiting This list may not describe all possible side effects. Call your doctor for medical advice about side effects. You may report side effects to FDA at 1-800-FDA-1088. Where should I keep my medication? This medication is given in a hospital or clinic. It will not be stored at home. NOTE: This sheet is a summary. It may not cover all possible information. If you have questions about this medicine, talk to your doctor, pharmacist, or health care provider.  2024 Elsevier/Gold Standard (2021-10-24 00:00:00)  Document Revised: 12/20/2020 Document Reviewed: 12/20/2020 Elsevier Patient Education  2024 [employer].

## 2023-01-31 ENCOUNTER — Other Ambulatory Visit: Payer: Self-pay

## 2023-02-01 ENCOUNTER — Inpatient Hospital Stay: Payer: BC Managed Care – PPO | Attending: Nurse Practitioner

## 2023-02-01 VITALS — BP 124/87 | HR 71 | Temp 98.4°F | Resp 18

## 2023-02-01 DIAGNOSIS — Z87442 Personal history of urinary calculi: Secondary | ICD-10-CM | POA: Diagnosis not present

## 2023-02-01 DIAGNOSIS — N200 Calculus of kidney: Secondary | ICD-10-CM | POA: Insufficient documentation

## 2023-02-01 DIAGNOSIS — R197 Diarrhea, unspecified: Secondary | ICD-10-CM | POA: Diagnosis not present

## 2023-02-01 DIAGNOSIS — Z5111 Encounter for antineoplastic chemotherapy: Secondary | ICD-10-CM | POA: Insufficient documentation

## 2023-02-01 DIAGNOSIS — Z79630 Long term (current) use of alkylating agent: Secondary | ICD-10-CM | POA: Insufficient documentation

## 2023-02-01 DIAGNOSIS — Z79631 Long term (current) use of antimetabolite agent: Secondary | ICD-10-CM | POA: Insufficient documentation

## 2023-02-01 DIAGNOSIS — Z5189 Encounter for other specified aftercare: Secondary | ICD-10-CM | POA: Insufficient documentation

## 2023-02-01 DIAGNOSIS — Z79899 Other long term (current) drug therapy: Secondary | ICD-10-CM | POA: Diagnosis not present

## 2023-02-01 DIAGNOSIS — D696 Thrombocytopenia, unspecified: Secondary | ICD-10-CM | POA: Insufficient documentation

## 2023-02-01 DIAGNOSIS — I10 Essential (primary) hypertension: Secondary | ICD-10-CM | POA: Diagnosis not present

## 2023-02-01 DIAGNOSIS — C184 Malignant neoplasm of transverse colon: Secondary | ICD-10-CM | POA: Diagnosis present

## 2023-02-01 DIAGNOSIS — F17200 Nicotine dependence, unspecified, uncomplicated: Secondary | ICD-10-CM | POA: Diagnosis not present

## 2023-02-01 DIAGNOSIS — R110 Nausea: Secondary | ICD-10-CM | POA: Diagnosis not present

## 2023-02-01 DIAGNOSIS — D509 Iron deficiency anemia, unspecified: Secondary | ICD-10-CM | POA: Insufficient documentation

## 2023-02-01 MED ORDER — HEPARIN SOD (PORK) LOCK FLUSH 100 UNIT/ML IV SOLN
500.0000 [IU] | Freq: Once | INTRAVENOUS | Status: AC | PRN
  Administered 2023-02-01: 500 [IU]

## 2023-02-01 MED ORDER — SODIUM CHLORIDE 0.9% FLUSH
10.0000 mL | INTRAVENOUS | Status: DC | PRN
  Administered 2023-02-01: 10 mL

## 2023-02-01 NOTE — Patient Instructions (Signed)

## 2023-02-04 ENCOUNTER — Telehealth: Payer: Self-pay | Admitting: *Deleted

## 2023-02-04 MED ORDER — DIPHENOXYLATE-ATROPINE 2.5-0.025 MG PO TABS: 1.0000 | ORAL_TABLET | Freq: Four times a day (QID) | ORAL | 1 refills | Status: AC | PRN

## 2023-02-04 MED ORDER — LOPERAMIDE HCL 2 MG PO CAPS: 2.0000 mg | ORAL_CAPSULE | ORAL | Status: DC | PRN

## 2023-02-04 NOTE — Telephone Encounter (Signed)
[name] [name] reports diarrhea up to 9/10 starting on 02/01/23 pm. Taking ~ 6 imodium/day. Able to eat and drink fluids. Has loose stool every time he eats. Sent script for Lomotil to his pharmacy and instructed to call back if not improved. Informed him he can take the Lomotil and Imodium and alternate as needed. Encouraged bland diet. He will call tomorrow afternoon if not better.

## 2023-02-10 ENCOUNTER — Other Ambulatory Visit: Payer: Self-pay | Admitting: Oncology

## 2023-02-13 ENCOUNTER — Encounter: Payer: Self-pay | Admitting: *Deleted

## 2023-02-13 ENCOUNTER — Inpatient Hospital Stay: Payer: BC Managed Care – PPO

## 2023-02-13 ENCOUNTER — Encounter: Payer: Self-pay | Admitting: Oncology

## 2023-02-13 ENCOUNTER — Inpatient Hospital Stay: Payer: BC Managed Care – PPO | Admitting: Oncology

## 2023-02-13 VITALS — BP 141/86 | HR 76 | Temp 98.2°F | Resp 18 | Ht 64.0 in | Wt 188.3 lb

## 2023-02-13 VITALS — BP 126/89 | HR 60 | Resp 18

## 2023-02-13 DIAGNOSIS — C184 Malignant neoplasm of transverse colon: Secondary | ICD-10-CM | POA: Diagnosis not present

## 2023-02-13 DIAGNOSIS — Z5111 Encounter for antineoplastic chemotherapy: Secondary | ICD-10-CM | POA: Diagnosis not present

## 2023-02-13 LAB — CBC WITH DIFFERENTIAL (CANCER CENTER ONLY)
Abs Immature Granulocytes: 0.01 10*3/uL (ref 0.00–0.07)
Basophils Absolute: 0 10*3/uL (ref 0.0–0.1)
Basophils Relative: 1 %
Eosinophils Absolute: 0.3 10*3/uL (ref 0.0–0.5)
Eosinophils Relative: 9 %
HCT: 35.8 % — ABNORMAL LOW (ref 39.0–52.0)
Hemoglobin: 11.6 g/dL — ABNORMAL LOW (ref 13.0–17.0)
Immature Granulocytes: 0 %
Lymphocytes Relative: 28 %
Lymphs Abs: 1 10*3/uL (ref 0.7–4.0)
MCH: 28.4 pg (ref 26.0–34.0)
MCHC: 32.4 g/dL (ref 30.0–36.0)
MCV: 87.7 fL (ref 80.0–100.0)
Monocytes Absolute: 0.6 10*3/uL (ref 0.1–1.0)
Monocytes Relative: 17 %
Neutro Abs: 1.6 10*3/uL — ABNORMAL LOW (ref 1.7–7.7)
Neutrophils Relative %: 45 %
Platelet Count: 124 10*3/uL — ABNORMAL LOW (ref 150–400)
RBC: 4.08 MIL/uL — ABNORMAL LOW (ref 4.22–5.81)
RDW: 24.5 % — ABNORMAL HIGH (ref 11.5–15.5)
WBC Count: 3.6 10*3/uL — ABNORMAL LOW (ref 4.0–10.5)
nRBC: 0 % (ref 0.0–0.2)

## 2023-02-13 LAB — CMP (CANCER CENTER ONLY)
ALT: 14 U/L (ref 0–44)
AST: 18 U/L (ref 15–41)
Albumin: 3.8 g/dL (ref 3.5–5.0)
Alkaline Phosphatase: 67 U/L (ref 38–126)
Anion gap: 9 (ref 5–15)
BUN: 9 mg/dL (ref 8–23)
CO2: 26 mmol/L (ref 22–32)
Calcium: 8.9 mg/dL (ref 8.9–10.3)
Chloride: 102 mmol/L (ref 98–111)
Creatinine: 1 mg/dL (ref 0.61–1.24)
GFR, Estimated: >60 mL/min (ref >60)
Glucose, Bld: 109 mg/dL — ABNORMAL HIGH (ref 70–99)
Potassium: 3.9 mmol/L (ref 3.5–5.1)
Sodium: 137 mmol/L (ref 135–145)
Total Bilirubin: 0.6 mg/dL (ref 0.3–1.2)
Total Protein: 6.6 g/dL (ref 6.5–8.1)

## 2023-02-13 MED ORDER — DEXTROSE 5 % IV SOLN: Freq: Once | INTRAVENOUS | Status: AC

## 2023-02-13 MED ORDER — SODIUM CHLORIDE 0.9 % IV SOLN
10.0000 mg | Freq: Once | INTRAVENOUS | Status: AC
  Administered 2023-02-13: 10 mg via INTRAVENOUS
  Filled 2023-02-13: qty 10

## 2023-02-13 MED ORDER — LEUCOVORIN CALCIUM INJECTION 350 MG
200.0000 mg/m2 | Freq: Once | INTRAVENOUS | Status: AC
  Administered 2023-02-13: 394 mg via INTRAVENOUS
  Filled 2023-02-13: qty 19.7

## 2023-02-13 MED ORDER — OXALIPLATIN CHEMO INJECTION 100 MG/20ML
85.0000 mg/m2 | Freq: Once | INTRAVENOUS | Status: AC
  Administered 2023-02-13: 165 mg via INTRAVENOUS
  Filled 2023-02-13: qty 33

## 2023-02-13 MED ORDER — FLUOROURACIL CHEMO INJECTION 500 MG/10ML
200.0000 mg/m2 | Freq: Once | INTRAVENOUS | Status: AC
  Administered 2023-02-13: 400 mg via INTRAVENOUS
  Filled 2023-02-13: qty 8

## 2023-02-13 MED ORDER — SODIUM CHLORIDE 0.9 % IV SOLN
1800.0000 mg/m2 | INTRAVENOUS | Status: DC
  Administered 2023-02-13: 3500 mg via INTRAVENOUS
  Filled 2023-02-13: qty 70

## 2023-02-13 MED ORDER — PALONOSETRON HCL INJECTION 0.25 MG/5ML
0.2500 mg | Freq: Once | INTRAVENOUS | Status: AC
  Administered 2023-02-13: 0.25 mg via INTRAVENOUS
  Filled 2023-02-13: qty 5

## 2023-02-13 NOTE — Progress Notes (Signed)
Patient seen by Dr. Truett Perna today  Vitals are within treatment parameters.OK to proceed w/BP 141/86  Labs reviewed by Dr. Truett Perna and are not all within treatment parameters. ANC 1.6  Per physician team, patient is ready for treatment. Please note that modifications are being made to the treatment plan including Dose reduction on Leucovorin + 5FU bolus and pump.

## 2023-02-13 NOTE — Patient Instructions (Signed)
[facility]   Discharge Instructions: Thank you for choosing [facility] to provide your oncology and hematology care.   If you have a lab appointment with the Cancer Center, please go directly to the Cancer Center and check in at the registration area.   Wear comfortable clothing and clothing appropriate for easy access to any Portacath or PICC line.   We strive to give you quality time with your provider. You may need to reschedule your appointment if you arrive late (15 or more minutes).  Arriving late affects you and other patients whose appointments are after yours.  Also, if you miss three or more appointments without notifying the office, you may be dismissed from the clinic at the provider's discretion.      For prescription refill requests, have your pharmacy contact our office and allow 72 hours for refills to be completed.    Today you received the following chemotherapy and/or immunotherapy agents Oxaliplatin (ELOXATIN), Leucovorin & Flourouracil (ADRUCIL).      To help prevent nausea and vomiting after your treatment, we encourage you to take your nausea medication as directed.  BELOW ARE SYMPTOMS THAT SHOULD BE REPORTED IMMEDIATELY: *FEVER GREATER THAN 100.4 F (38 C) OR HIGHER *CHILLS OR SWEATING *NAUSEA AND VOMITING THAT IS NOT CONTROLLED WITH YOUR NAUSEA MEDICATION *UNUSUAL SHORTNESS OF BREATH *UNUSUAL BRUISING OR BLEEDING *URINARY PROBLEMS (pain or burning when urinating, or frequent urination) *BOWEL PROBLEMS (unusual diarrhea, constipation, pain near the anus) TENDERNESS IN MOUTH AND THROAT WITH OR WITHOUT PRESENCE OF ULCERS (sore throat, sores in mouth, or a toothache) UNUSUAL RASH, SWELLING OR PAIN  UNUSUAL VAGINAL DISCHARGE OR ITCHING   Items with * indicate a potential emergency and should be followed up as soon as possible or go to the Emergency Department if any problems should occur.  Please show the  CHEMOTHERAPY ALERT CARD or IMMUNOTHERAPY ALERT CARD at check-in to the Emergency Department and triage nurse.  Should you have questions after your visit or need to cancel or reschedule your appointment, please contact [facility]  Dept: [phone]  and follow the prompts.  Office hours are 8:00 a.m. to 4:30 p.m. Monday - Friday. Please note that voicemails left after 4:00 p.m. may not be returned until the following business day.  We are closed weekends and major holidays. You have access to a nurse at all times for urgent questions. Please call the main number to the clinic Dept: [phone] and follow the prompts.   For any non-urgent questions, you may also contact your provider using MyChart. We now offer e-Visits for anyone 67 and older to request care online for non-urgent symptoms. For details visit [email or web address].   Also download the MyChart app! Go to the app store, search "MyChart", open the app, select [facility], and log in with your MyChart username and password.  Oxaliplatin Injection What is this medication? OXALIPLATIN (ox AL i PLA tin) treats colorectal cancer. It works by slowing down the growth of cancer cells. This medicine may be used for other purposes; ask your health care provider or pharmacist if you have questions. COMMON BRAND NAME(S): Eloxatin What should I tell my care team before I take this medication? They need to know if you have any of these conditions: Heart disease History of irregular heartbeat or rhythm Liver disease Low blood cell levels (Brune cells, red cells, and platelets) Lung or breathing disease, such as asthma Take medications that treat  or prevent blood clots Tingling of the fingers, toes, or other nerve disorder An unusual or allergic reaction to oxaliplatin, other medications, foods, dyes, or preservatives If you or your partner are pregnant or trying to get pregnant Breast-feeding How should  I use this medication? This medication is injected into a vein. It is given by your care team in a hospital or clinic setting. Talk to your care team about the use of this medication in children. Special care may be needed. Overdosage: If you think you have taken too much of this medicine contact a poison control center or emergency room at once. NOTE: This medicine is only for you. Do not share this medicine with others. What if I miss a dose? Keep appointments for follow-up doses. It is important not to miss a dose. Call your care team if you are unable to keep an appointment. What may interact with this medication? Do not take this medication with any of the following: Cisapride Dronedarone Pimozide Thioridazine This medication may also interact with the following: Aspirin and aspirin-like medications Certain medications that treat or prevent blood clots, such as warfarin, apixaban, dabigatran, and rivaroxaban Cisplatin Cyclosporine Diuretics Medications for infection, such as acyclovir, adefovir, amphotericin B, bacitracin, cidofovir, foscarnet, ganciclovir, gentamicin, pentamidine, vancomycin NSAIDs, medications for pain and inflammation, such as ibuprofen or naproxen Other medications that cause heart rhythm changes Pamidronate Zoledronic acid This list may not describe all possible interactions. Give your health care provider a list of all the medicines, herbs, non-prescription drugs, or dietary supplements you use. Also tell them if you smoke, drink alcohol, or use illegal drugs. Some items may interact with your medicine. What should I watch for while using this medication? Your condition will be monitored carefully while you are receiving this medication. You may need blood work while taking this medication. This medication may make you feel generally unwell. This is not uncommon as chemotherapy can affect healthy cells as well as cancer cells. Report any side effects. Continue  your course of treatment even though you feel ill unless your care team tells you to stop. This medication may increase your risk of getting an infection. Call your care team for advice if you get a fever, chills, sore throat, or other symptoms of a cold or flu. Do not treat yourself. Try to avoid being around people who are sick. Avoid taking medications that contain aspirin, acetaminophen, ibuprofen, naproxen, or ketoprofen unless instructed by your care team. These medications may hide a fever. Be careful brushing or flossing your teeth or using a toothpick because you may get an infection or bleed more easily. If you have any dental work done, tell your dentist you are receiving this medication. This medication can make you more sensitive to cold. Do not drink cold drinks or use ice. Cover exposed skin before coming in contact with cold temperatures or cold objects. When out in cold weather wear warm clothing and cover your mouth and nose to warm the air that goes into your lungs. Tell your care team if you get sensitive to the cold. Talk to your care team if you or your partner are pregnant or think either of you might be pregnant. This medication can cause serious birth defects if taken during pregnancy and for 9 months after the last dose. A negative pregnancy test is required before starting this medication. A reliable form of contraception is recommended while taking this medication and for 9 months after the last dose. Talk to your care  team about effective forms of contraception. Do not father a child while taking this medication and for 6 months after the last dose. Use a condom while having sex during this time period. Do not breastfeed while taking this medication and for 3 months after the last dose. This medication may cause infertility. Talk to your care team if you are concerned about your fertility. What side effects may I notice from receiving this medication? Side effects that you  should report to your care team as soon as possible: Allergic reactions--skin rash, itching, hives, swelling of the face, lips, tongue, or throat Bleeding--bloody or black, tar-like stools, vomiting blood or brown material that looks like coffee grounds, red or dark brown urine, small red or purple spots on skin, unusual bruising or bleeding Dry cough, shortness of breath or trouble breathing Heart rhythm changes--fast or irregular heartbeat, dizziness, feeling faint or lightheaded, chest pain, trouble breathing Infection--fever, chills, cough, sore throat, wounds that don't heal, pain or trouble when passing urine, general feeling of discomfort or being unwell Liver injury--right upper belly pain, loss of appetite, nausea, light-colored stool, dark yellow or brown urine, yellowing skin or eyes, unusual weakness or fatigue Low red blood cell level--unusual weakness or fatigue, dizziness, headache, trouble breathing Muscle injury--unusual weakness or fatigue, muscle pain, dark yellow or brown urine, decrease in amount of urine Pain, tingling, or numbness in the hands or feet Sudden and severe headache, confusion, change in vision, seizures, which may be signs of posterior reversible encephalopathy syndrome (PRES) Unusual bruising or bleeding Side effects that usually do not require medical attention (report to your care team if they continue or are bothersome): Diarrhea Nausea Pain, redness, or swelling with sores inside the mouth or throat Unusual weakness or fatigue Vomiting This list may not describe all possible side effects. Call your doctor for medical advice about side effects. You may report side effects to FDA at 1-800-FDA-1088. Where should I keep my medication? This medication is given in a hospital or clinic. It will not be stored at home. NOTE: This sheet is a summary. It may not cover all possible information. If you have questions about this medicine, talk to your doctor,  pharmacist, or health care provider.  2024 Elsevier/Gold Standard (2022-04-03 00:00:00)  Leucovorin Injection What is this medication? LEUCOVORIN (loo koe VOR in) prevents side effects from certain medications, such as methotrexate. It works by increasing folate levels. This helps protect healthy cells in your body. It may also be used to treat anemia caused by low levels of folate. It can also be used with fluorouracil, a type of chemotherapy, to treat colorectal cancer. It works by increasing the effects of fluorouracil in the body. This medicine may be used for other purposes; ask your health care provider or pharmacist if you have questions. What should I tell my care team before I take this medication? They need to know if you have any of these conditions: Anemia from low levels of vitamin B12 in the blood An unusual or allergic reaction to leucovorin, folic acid, other medications, foods, dyes, or preservatives Pregnant or trying to get pregnant Breastfeeding How should I use this medication? This medication is injected into a vein or a muscle. It is given by your care team in a hospital or clinic setting. Talk to your care team about the use of this medication in children. Special care may be needed. Overdosage: If you think you have taken too much of this medicine contact a poison control center  or emergency room at once. NOTE: This medicine is only for you. Do not share this medicine with others. What if I miss a dose? Keep appointments for follow-up doses. It is important not to miss your dose. Call your care team if you are unable to keep an appointment. What may interact with this medication? Capecitabine Fluorouracil Phenobarbital Phenytoin Primidone Trimethoprim;sulfamethoxazole This list may not describe all possible interactions. Give your health care provider a list of all the medicines, herbs, non-prescription drugs, or dietary supplements you use. Also tell them if you  smoke, drink alcohol, or use illegal drugs. Some items may interact with your medicine. What should I watch for while using this medication? Your condition will be monitored carefully while you are receiving this medication. This medication may increase the side effects of 5-fluorouracil. Tell your care team if you have diarrhea or mouth sores that do not get better or that get worse. What side effects may I notice from receiving this medication? Side effects that you should report to your care team as soon as possible: Allergic reactions--skin rash, itching, hives, swelling of the face, lips, tongue, or throat This list may not describe all possible side effects. Call your doctor for medical advice about side effects. You may report side effects to FDA at 1-800-FDA-1088. Where should I keep my medication? This medication is given in a hospital or clinic. It will not be stored at home. NOTE: This sheet is a summary. It may not cover all possible information. If you have questions about this medicine, talk to your doctor, pharmacist, or health care provider.  2024 Elsevier/Gold Standard (2021-11-21 00:00:00)  Fluorouracil Injection What is this medication? FLUOROURACIL (flure oh YOOR a sil) treats some types of cancer. It works by slowing down the growth of cancer cells. This medicine may be used for other purposes; ask your health care provider or pharmacist if you have questions. COMMON BRAND NAME(S): Adrucil What should I tell my care team before I take this medication? They need to know if you have any of these conditions: Blood disorders Dihydropyrimidine dehydrogenase (DPD) deficiency Infection, such as chickenpox, cold sores, herpes Kidney disease Liver disease Poor nutrition Recent or ongoing radiation therapy An unusual or allergic reaction to fluorouracil, other medications, foods, dyes, or preservatives If you or your partner are pregnant or trying to get  pregnant Breast-feeding How should I use this medication? This medication is injected into a vein. It is administered by your care team in a hospital or clinic setting. Talk to your care team about the use of this medication in children. Special care may be needed. Overdosage: If you think you have taken too much of this medicine contact a poison control center or emergency room at once. NOTE: This medicine is only for you. Do not share this medicine with others. What if I miss a dose? Keep appointments for follow-up doses. It is important not to miss your dose. Call your care team if you are unable to keep an appointment. What may interact with this medication? Do not take this medication with any of the following: Live virus vaccines This medication may also interact with the following: Medications that treat or prevent blood clots, such as warfarin, enoxaparin, dalteparin This list may not describe all possible interactions. Give your health care provider a list of all the medicines, herbs, non-prescription drugs, or dietary supplements you use. Also tell them if you smoke, drink alcohol, or use illegal drugs. Some items may interact with your  medicine. What should I watch for while using this medication? Your condition will be monitored carefully while you are receiving this medication. This medication may make you feel generally unwell. This is not uncommon as chemotherapy can affect healthy cells as well as cancer cells. Report any side effects. Continue your course of treatment even though you feel ill unless your care team tells you to stop. In some cases, you may be given additional medications to help with side effects. Follow all directions for their use. This medication may increase your risk of getting an infection. Call your care team for advice if you get a fever, chills, sore throat, or other symptoms of a cold or flu. Do not treat yourself. Try to avoid being around people who are  sick. This medication may increase your risk to bruise or bleed. Call your care team if you notice any unusual bleeding. Be careful brushing or flossing your teeth or using a toothpick because you may get an infection or bleed more easily. If you have any dental work done, tell your dentist you are receiving this medication. Avoid taking medications that contain aspirin, acetaminophen, ibuprofen, naproxen, or ketoprofen unless instructed by your care team. These medications may hide a fever. Do not treat diarrhea with over the counter products. Contact your care team if you have diarrhea that lasts more than 2 days or if it is severe and watery. This medication can make you more sensitive to the sun. Keep out of the sun. If you cannot avoid being in the sun, wear protective clothing and sunscreen. Do not use sun lamps, tanning beds, or tanning booths. Talk to your care team if you or your partner wish to become pregnant or think you might be pregnant. This medication can cause serious birth defects if taken during pregnancy and for 3 months after the last dose. A reliable form of contraception is recommended while taking this medication and for 3 months after the last dose. Talk to your care team about effective forms of contraception. Do not father a child while taking this medication and for 3 months after the last dose. Use a condom while having sex during this time period. Do not breastfeed while taking this medication. This medication may cause infertility. Talk to your care team if you are concerned about your fertility. What side effects may I notice from receiving this medication? Side effects that you should report to your care team as soon as possible: Allergic reactions--skin rash, itching, hives, swelling of the face, lips, tongue, or throat Heart attack--pain or tightness in the chest, shoulders, arms, or jaw, nausea, shortness of breath, cold or clammy skin, feeling faint or  lightheaded Heart failure--shortness of breath, swelling of the ankles, feet, or hands, sudden weight gain, unusual weakness or fatigue Heart rhythm changes--fast or irregular heartbeat, dizziness, feeling faint or lightheaded, chest pain, trouble breathing High ammonia level--unusual weakness or fatigue, confusion, loss of appetite, nausea, vomiting, seizures Infection--fever, chills, cough, sore throat, wounds that don't heal, pain or trouble when passing urine, general feeling of discomfort or being unwell Low red blood cell level--unusual weakness or fatigue, dizziness, headache, trouble breathing Pain, tingling, or numbness in the hands or feet, muscle weakness, change in vision, confusion or trouble speaking, loss of balance or coordination, trouble walking, seizures Redness, swelling, and blistering of the skin over hands and feet Severe or prolonged diarrhea Unusual bruising or bleeding Side effects that usually do not require medical attention (report to your care team if  they continue or are bothersome): Dry skin Headache Increased tears Nausea Pain, redness, or swelling with sores inside the mouth or throat Sensitivity to light Vomiting This list may not describe all possible side effects. Call your doctor for medical advice about side effects. You may report side effects to FDA at 1-800-FDA-1088. Where should I keep my medication? This medication is given in a hospital or clinic. It will not be stored at home. NOTE: This sheet is a summary. It may not cover all possible information. If you have questions about this medicine, talk to your doctor, pharmacist, or health care provider.  2024 Elsevier/Gold Standard (2021-10-24 00:00:00)  The chemotherapy medication bag should finish at 46 hours, 96 hours, or 7 days. For example, if your pump is scheduled for 46 hours and it was put on at 4:00 p.m., it should finish at 2:00 p.m. the day it is scheduled to come off regardless of your  appointment time.     Estimated time to finish at 11:15 a.m on Friday 02/15/2023.   If the display on your pump reads "Low Volume" and it is beeping, take the batteries out of the pump and come to the cancer center for it to be taken off.   If the pump alarms go off prior to the pump reading "Low Volume" then call [phone] and someone can assist you.  If the plunger comes out and the chemotherapy medication is leaking out, please use your home chemo spill kit to clean up the spill. Do NOT use paper towels or other household products.  If you have problems or questions regarding your pump, please call either [phone] (24 hours a day) or the cancer center Monday-Friday 8:00 a.m.- 4:30 p.m. at the clinic number and we will assist you. If you are unable to get assistance, then go to the nearest Emergency Department and ask the staff to contact the IV team for assistance.

## 2023-02-13 NOTE — Progress Notes (Signed)
[facility] OFFICE PROGRESS NOTE   Diagnosis: Colon cancer  INTERVAL HISTORY:   [name]. Such completed on cycle FOLFOX beginning 01/30/2023.  He developed diarrhea beginning on day 3.  The diarrhea lasted for 1 week.  The diarrhea was not relieved with Imodium.  He reports partial relief of diarrhea when he started Lomotil.  The diarrhea resolved by 02/09/2023.  He reports mild cold sensitivity following chemotherapy.  No neuropathy symptoms at present.  Objective:  Vital signs in last 24 hours:  Blood pressure (!) 141/86, pulse 76, temperature 98.2 F (36.8 C), temperature source Oral, resp. rate 18, height 5\' 4"  (1.626 m), weight 188 lb 4.8 oz (85.4 kg), SpO2 98%.    HEENT: No thrush or ulcers Resp: Lungs clear bilaterally Cardio: Regular rate and rhythm GI: No hepatosplenomegaly, nontender Vascular: No leg edema  Skin: Palms without erythema  Portacath/PICC-without erythema  Lab Results:  Lab Results  Component Value Date   WBC 3.6 (L) 02/13/2023   HGB 11.6 (L) 02/13/2023   HCT 35.8 (L) 02/13/2023   MCV 87.7 02/13/2023   PLT 124 (L) 02/13/2023   NEUTROABS 1.6 (L) 02/13/2023    CMP  Lab Results  Component Value Date   NA 137 02/13/2023   K 3.9 02/13/2023   CL 102 02/13/2023   CO2 26 02/13/2023   GLUCOSE 109 (H) 02/13/2023   BUN 9 02/13/2023   CREATININE 1.00 02/13/2023   CALCIUM 8.9 02/13/2023   PROT 6.6 02/13/2023   ALBUMIN 3.8 02/13/2023   AST 18 02/13/2023   ALT 14 02/13/2023   ALKPHOS 67 02/13/2023   BILITOT 0.6 02/13/2023   GFRNONAA >60 02/13/2023   GFRAA >60 04/13/2018    Lab Results  Component Value Date   CEA 1.63 12/18/2022     Medications: I have reviewed the patient's current medications.   Assessment/Plan: Colon cancer, stage IIIb (pT4a, pN1a) Colonoscopy 10/10/2022 -circumferential fungating mass thought to represent the proximal descending colon at 70 cm from the anus that could not be passed with the scope.  Biopsy  showed adenocarcinoma, at least intramucosal; intact expression of all 4 mismatch repair proteins.   CT scans 10/17/2022-2 noncalcified nodules adjacent to the horizontal fissure within the right thorax between the right upper lobe and right middle lobe measuring 11 and 7 mm in size; no other pulmonary nodules; 10 mm right paratracheal lymph node liver was negative for suspicious findings; spleen with 2 small densities; a constricting lesion of the distal transverse colon suspected seen to the left of midline adjacent to the splenic flexure measuring 3.6 cm in length; significant narrowing of the lumen of the bowel at this location with bowel wall thickening; hazy stranding within the adjacent mesenteric fat; along the superior margin of the lesion and exophytic 12 mm density possibly representing local invasion and/or adjacent adenopathy.   CEA 10/17/2022 3.0 (normal range for non-smokers less than 3.0, smokers less than 5.0) Laparoscopic extended right hemicolectomy 11/05/2022 by Dr. Byrd Hesselbach.  Final pathology showed 6 cm invasive moderately differentiated adenocarcinoma in the distal transverse colon, invading the visceral peritoneum; resection margins negative; positive lymphovascular and perineural invasion; 1 out of 26 lymph nodes positive for carcinoma; no tumor deposits identified; appendiceal serrated lesion with low-grade dysplasia; pT4a, pN1a Cycle 1 FOLFOX 12/19/2022 Cycle 2 FOLFOX 01/02/2023 Cycle 3 FOLFOX 01/16/2023 Cycle 4 FOLFOX 01/30/2023 Cycle 5 FOLFOX 02/13/2023-5-FU leucovorin dose reduced secondary to diarrhea   Iron deficiency anemia-he reports receiving IV iron prior to the colon surgery 12/18/2022 ferritin  11, hemoglobin 11.1/MCV 79 12/18/2022 ferrous sulfate 1 tablet daily Hypertension History of kidney stones Colonoscopy 10/10/2022-mass could not be passed with the scope Port-A-Cath placement, Interventional Radiology, 12/17/2022      Disposition: [name] [name] has completed 4 cycles of  FOLFOX.  He developed increased diarrhea following the last cycle of chemotherapy.  The 5-FU bolus and infusion will be decreased with this cycle.  He will use Imodium and Lomotil as needed.  He will call for significant diarrhea or new symptoms.  [name] [name] will complete cycle 5 FOLFOX today.  He will return for an office visit and chemotherapy in 2 weeks.  He will receive G-CSF with this cycle of chemotherapy.  We reviewed potential toxicities associated with G-CSF including the chance of developing bone pain.  Thornton Papas, MD  02/13/2023  8:31 AM

## 2023-02-14 ENCOUNTER — Other Ambulatory Visit: Payer: Self-pay

## 2023-02-15 ENCOUNTER — Inpatient Hospital Stay: Payer: BC Managed Care – PPO

## 2023-02-15 VITALS — BP 119/80 | HR 64 | Temp 97.7°F | Resp 18

## 2023-02-15 DIAGNOSIS — C184 Malignant neoplasm of transverse colon: Secondary | ICD-10-CM

## 2023-02-15 DIAGNOSIS — Z5111 Encounter for antineoplastic chemotherapy: Secondary | ICD-10-CM | POA: Diagnosis not present

## 2023-02-15 MED ORDER — HEPARIN SOD (PORK) LOCK FLUSH 100 UNIT/ML IV SOLN
500.0000 [IU] | Freq: Once | INTRAVENOUS | Status: AC | PRN
  Administered 2023-02-15: 500 [IU]

## 2023-02-15 MED ORDER — SODIUM CHLORIDE 0.9% FLUSH
10.0000 mL | INTRAVENOUS | Status: DC | PRN
  Administered 2023-02-15: 10 mL

## 2023-02-15 MED ORDER — PEGFILGRASTIM-CBQV 6 MG/0.6ML ~~LOC~~ SOSY
6.0000 mg | PREFILLED_SYRINGE | Freq: Once | SUBCUTANEOUS | Status: AC
  Administered 2023-02-15: 6 mg via SUBCUTANEOUS
  Filled 2023-02-15: qty 0.6

## 2023-02-15 NOTE — Patient Instructions (Signed)

## 2023-02-18 ENCOUNTER — Other Ambulatory Visit: Payer: Self-pay | Admitting: Nurse Practitioner

## 2023-02-18 ENCOUNTER — Emergency Department (HOSPITAL_BASED_OUTPATIENT_CLINIC_OR_DEPARTMENT_OTHER): Payer: BC Managed Care – PPO

## 2023-02-18 ENCOUNTER — Inpatient Hospital Stay: Payer: BC Managed Care – PPO

## 2023-02-18 ENCOUNTER — Telehealth: Payer: Self-pay | Admitting: *Deleted

## 2023-02-18 ENCOUNTER — Inpatient Hospital Stay (HOSPITAL_BASED_OUTPATIENT_CLINIC_OR_DEPARTMENT_OTHER): Payer: BC Managed Care – PPO | Admitting: Nurse Practitioner

## 2023-02-18 ENCOUNTER — Encounter (HOSPITAL_BASED_OUTPATIENT_CLINIC_OR_DEPARTMENT_OTHER): Payer: Self-pay | Admitting: Emergency Medicine

## 2023-02-18 ENCOUNTER — Inpatient Hospital Stay (HOSPITAL_BASED_OUTPATIENT_CLINIC_OR_DEPARTMENT_OTHER)
Admission: EM | Admit: 2023-02-18 | Discharge: 2023-02-21 | DRG: 394 | Disposition: A | Discharge status: Home / Self Care | Payer: BC Managed Care – PPO | Attending: Internal Medicine | Admitting: Internal Medicine

## 2023-02-18 ENCOUNTER — Other Ambulatory Visit: Payer: Self-pay | Admitting: *Deleted

## 2023-02-18 DIAGNOSIS — K529 Noninfective gastroenteritis and colitis, unspecified: Secondary | ICD-10-CM | POA: Diagnosis not present

## 2023-02-18 DIAGNOSIS — Z9049 Acquired absence of other specified parts of digestive tract: Secondary | ICD-10-CM

## 2023-02-18 DIAGNOSIS — K921 Melena: Secondary | ICD-10-CM | POA: Diagnosis present

## 2023-02-18 DIAGNOSIS — R197 Diarrhea, unspecified: Secondary | ICD-10-CM

## 2023-02-18 DIAGNOSIS — C184 Malignant neoplasm of transverse colon: Secondary | ICD-10-CM

## 2023-02-18 DIAGNOSIS — E860 Dehydration: Secondary | ICD-10-CM | POA: Diagnosis present

## 2023-02-18 DIAGNOSIS — Z79899 Other long term (current) drug therapy: Secondary | ICD-10-CM

## 2023-02-18 DIAGNOSIS — K922 Gastrointestinal hemorrhage, unspecified: Secondary | ICD-10-CM | POA: Diagnosis present

## 2023-02-18 DIAGNOSIS — I10 Essential (primary) hypertension: Secondary | ICD-10-CM | POA: Diagnosis present

## 2023-02-18 DIAGNOSIS — K55019 Acute (reversible) ischemia of small intestine, extent unspecified: Secondary | ICD-10-CM | POA: Diagnosis not present

## 2023-02-18 DIAGNOSIS — D509 Iron deficiency anemia, unspecified: Secondary | ICD-10-CM | POA: Diagnosis present

## 2023-02-18 DIAGNOSIS — K55069 Acute infarction of intestine, part and extent unspecified: Principal | ICD-10-CM | POA: Diagnosis present

## 2023-02-18 DIAGNOSIS — C189 Malignant neoplasm of colon, unspecified: Secondary | ICD-10-CM | POA: Diagnosis present

## 2023-02-18 DIAGNOSIS — Z888 Allergy status to other drugs, medicaments and biological substances status: Secondary | ICD-10-CM

## 2023-02-18 DIAGNOSIS — T451X5A Adverse effect of antineoplastic and immunosuppressive drugs, initial encounter: Secondary | ICD-10-CM | POA: Diagnosis present

## 2023-02-18 DIAGNOSIS — N400 Enlarged prostate without lower urinary tract symptoms: Secondary | ICD-10-CM | POA: Diagnosis present

## 2023-02-18 DIAGNOSIS — R1031 Right lower quadrant pain: Secondary | ICD-10-CM

## 2023-02-18 HISTORY — DX: Malignant (primary) neoplasm, unspecified: C80.1

## 2023-02-18 LAB — COMPREHENSIVE METABOLIC PANEL
ALT: 20 U/L (ref 0–44)
AST: 25 U/L (ref 15–41)
Albumin: 4.2 g/dL (ref 3.5–5.0)
Alkaline Phosphatase: 130 U/L — ABNORMAL HIGH (ref 38–126)
Anion gap: 10 (ref 5–15)
BUN: 14 mg/dL (ref 8–23)
CO2: 23 mmol/L (ref 22–32)
Calcium: 9.3 mg/dL (ref 8.9–10.3)
Chloride: 105 mmol/L (ref 98–111)
Creatinine, Ser: 0.84 mg/dL (ref 0.61–1.24)
GFR, Estimated: >60 mL/min (ref >60)
Glucose, Bld: 92 mg/dL (ref 70–99)
Potassium: 4.2 mmol/L (ref 3.5–5.1)
Sodium: 138 mmol/L (ref 135–145)
Total Bilirubin: 0.8 mg/dL (ref 0.3–1.2)
Total Protein: 7 g/dL (ref 6.5–8.1)

## 2023-02-18 LAB — CBC
HCT: 41.3 % (ref 39.0–52.0)
Hemoglobin: 13.8 g/dL (ref 13.0–17.0)
MCH: 29.6 pg (ref 26.0–34.0)
MCHC: 33.4 g/dL (ref 30.0–36.0)
MCV: 88.4 fL (ref 80.0–100.0)
Platelets: 169 10*3/uL (ref 150–400)
RBC: 4.67 MIL/uL (ref 4.22–5.81)
RDW: 24.4 % — ABNORMAL HIGH (ref 11.5–15.5)
WBC: 15.3 10*3/uL — ABNORMAL HIGH (ref 4.0–10.5)
nRBC: 0 % (ref 0.0–0.2)

## 2023-02-18 LAB — CBC WITH DIFFERENTIAL (CANCER CENTER ONLY)
Abs Immature Granulocytes: 0.22 10*3/uL — ABNORMAL HIGH (ref 0.00–0.07)
Basophils Absolute: 0.1 10*3/uL (ref 0.0–0.1)
Basophils Relative: 1 %
Eosinophils Absolute: 0.3 10*3/uL (ref 0.0–0.5)
Eosinophils Relative: 2 %
HCT: 39.8 % (ref 39.0–52.0)
Hemoglobin: 13 g/dL (ref 13.0–17.0)
Immature Granulocytes: 1 %
Lymphocytes Relative: 9 %
Lymphs Abs: 1.4 10*3/uL (ref 0.7–4.0)
MCH: 28.6 pg (ref 26.0–34.0)
MCHC: 32.7 g/dL (ref 30.0–36.0)
MCV: 87.5 fL (ref 80.0–100.0)
Monocytes Absolute: 0.9 10*3/uL (ref 0.1–1.0)
Monocytes Relative: 6 %
Neutro Abs: 12.3 10*3/uL — ABNORMAL HIGH (ref 1.7–7.7)
Neutrophils Relative %: 81 %
Platelet Count: 159 10*3/uL (ref 150–400)
RBC: 4.55 MIL/uL (ref 4.22–5.81)
RDW: 24 % — ABNORMAL HIGH (ref 11.5–15.5)
WBC Count: 15.3 10*3/uL — ABNORMAL HIGH (ref 4.0–10.5)
nRBC: 0 % (ref 0.0–0.2)

## 2023-02-18 LAB — CMP (CANCER CENTER ONLY)
ALT: 20 U/L (ref 0–44)
AST: 25 U/L (ref 15–41)
Albumin: 4.2 g/dL (ref 3.5–5.0)
Alkaline Phosphatase: 125 U/L (ref 38–126)
Anion gap: 11 (ref 5–15)
BUN: 14 mg/dL (ref 8–23)
CO2: 24 mmol/L (ref 22–32)
Calcium: 9.4 mg/dL (ref 8.9–10.3)
Chloride: 101 mmol/L (ref 98–111)
Creatinine: 0.91 mg/dL (ref 0.61–1.24)
GFR, Estimated: >60 mL/min (ref >60)
Glucose, Bld: 94 mg/dL (ref 70–99)
Potassium: 4.3 mmol/L (ref 3.5–5.1)
Sodium: 136 mmol/L (ref 135–145)
Total Bilirubin: 0.8 mg/dL (ref 0.3–1.2)
Total Protein: 7.3 g/dL (ref 6.5–8.1)

## 2023-02-18 LAB — URINALYSIS, ROUTINE W REFLEX MICROSCOPIC
Bilirubin Urine: NEGATIVE
Glucose, UA: NEGATIVE mg/dL
Hgb urine dipstick: NEGATIVE
Ketones, ur: NEGATIVE mg/dL
Leukocytes,Ua: NEGATIVE
Nitrite: NEGATIVE
Protein, ur: NEGATIVE mg/dL
Specific Gravity, Urine: 1.017 (ref 1.005–1.030)
pH: 6.5 (ref 5.0–8.0)

## 2023-02-18 LAB — LIPASE, BLOOD: Lipase: 71 U/L — ABNORMAL HIGH (ref 11–51)

## 2023-02-18 LAB — LACTIC ACID, PLASMA: Lactic Acid, Venous: 1.8 mmol/L (ref 0.5–1.9)

## 2023-02-18 MED ORDER — HEPARIN SOD (PORK) LOCK FLUSH 100 UNIT/ML IV SOLN: 500.0000 [IU] | INTRAVENOUS | Status: DC | PRN

## 2023-02-18 MED ORDER — SODIUM CHLORIDE 0.9 % IV SOLN: INTRAVENOUS | Status: AC

## 2023-02-18 MED ORDER — IOHEXOL 300 MG/ML  SOLN
100.0000 mL | Freq: Once | INTRAMUSCULAR | Status: AC | PRN
  Administered 2023-02-18: 85 mL via INTRAVENOUS

## 2023-02-18 MED ORDER — SODIUM CHLORIDE 0.9% FLUSH: 10.0000 mL | INTRAVENOUS | Status: DC | PRN

## 2023-02-18 NOTE — ED Provider Notes (Signed)
EMERGENCY DEPARTMENT AT [facility] Provider Note   CSN: [id number] Arrival date & time: 02/18/23  1658     History  Chief Complaint  Patient presents with   Abdominal Pain    [name] [name] is a 62 y.o. male.  Patient with history of right sided colon adenocarcinoma status post right hemicolectomy in 5/24 currently on chemotherapy presents today with complaints of abdominal pain and diarrhea. He states that his last chemo infusion was last week. He has had 3 days of diarrhea that is bloody. He notes that he has blood in the toilet and when he wipes. He is not anticoagulated. He notes that he has been having up to 20 bouts of diarrhea per day with RLQ abdominal pain as well. He informed his oncologist that this was occurring and he was told to come in for fluids. He received IV fluids at the cancer center and was then told that he should come to the ER to be evaluated given his diarrhea and abdominal pain. He presents for same. Denies fevers, chills, nausea, vomiting. Pain is localized to the RLQ and is constant. Denies any history of similar symptoms previously.   The history is provided by the patient. No language interpreter was used.  Abdominal Pain Associated symptoms: diarrhea        Home Medications Prior to Admission medications   Medication Sig Start Date End Date Taking? Authorizing Provider  cloNIDine (CATAPRES) 0.1 MG tablet Take 1 tablet (0.1 mg total) by mouth every 6 (six) hours as needed. Patient not taking: Reported on 12/07/2022 02/14/17   Azalia Bilis, MD  diphenoxylate-atropine (LOMOTIL) 2.5-0.025 MG tablet Take 1-2 tablets by mouth 4 (four) times daily as needed for diarrhea or loose stools (Maximum of 8/day). 02/04/23   Ladene Artist, MD  ibuprofen (ADVIL,MOTRIN) 200 MG tablet Take 600 mg by mouth every 8 (eight) hours as needed for mild pain. Pain/inflammation    [provider]  lidocaine-prilocaine (EMLA) cream Apply 1 Application  topically as needed. 12/18/22   Rana Snare, NP  loperamide (IMODIUM) 2 MG capsule Take 1-2 capsules (2-4 mg total) by mouth as needed for diarrhea or loose stools (up to 8/day). 02/04/23   Ladene Artist, MD  ondansetron (ZOFRAN) 8 MG tablet Take 1 tablet (8 mg total) by mouth every 8 (eight) hours as needed for nausea or vomiting (Starting day 3 after chemo as needed for nausea). Patient not taking: Reported on 01/30/2023 12/18/22   Rana Snare, NP  OVER THE COUNTER MEDICATION Apply 1 application  topically daily as needed (pain). "mg12 cream"    [provider]  promethazine (PHENERGAN) 12.5 MG tablet Take 1 tablet (12.5 mg total) by mouth every 6 (six) hours as needed for nausea or vomiting. Patient not taking: Reported on 01/30/2023 12/18/22   Rana Snare, NP  tamsulosin (FLOMAX) 0.4 MG CAPS capsule Take 1 capsule (0.4 mg total) by mouth 2 (two) times daily. Patient not taking: Reported on 01/30/2023 04/13/18   Roxy Horseman, PA-C      Allergies    Compazine [prochlorperazine edisylate] and Prochlorperazine    Review of Systems   Review of Systems  Gastrointestinal:  Positive for abdominal pain and diarrhea.  All other systems reviewed and are negative.   Physical Exam Updated Vital Signs BP (!) 133/113   Pulse 91   Temp 98.6 F (37 C) (Oral)   Resp 16   SpO2 99%  Physical Exam Vitals and nursing  note reviewed.  Constitutional:      General: He is not in acute distress.    Appearance: Normal appearance. He is well-developed and normal weight. He is not ill-appearing, toxic-appearing or diaphoretic.     Comments: Well appearing  HENT:     Head: Normocephalic and atraumatic.  Cardiovascular:     Rate and Rhythm: Normal rate.  Pulmonary:     Effort: Pulmonary effort is normal. No respiratory distress.  Abdominal:     General: Abdomen is flat.     Palpations: Abdomen is soft.     Tenderness: There is abdominal tenderness in the right lower quadrant.   Genitourinary:    Comments: No internal exam performed. External exam reveals no hemorrhoids Musculoskeletal:        General: Normal range of motion.     Cervical back: Normal range of motion.  Skin:    General: Skin is warm and dry.  Neurological:     General: No focal deficit present.     Mental Status: He is alert.  Psychiatric:        Mood and Affect: Mood normal.        Behavior: Behavior normal.     ED Results / Procedures / Treatments   Labs (all labs ordered are listed, but only abnormal results are displayed) Labs Reviewed  LIPASE, BLOOD - Abnormal; Notable for the following components:      Result Value   Lipase 71 (*)    All other components within normal limits  COMPREHENSIVE METABOLIC PANEL - Abnormal; Notable for the following components:   Alkaline Phosphatase 130 (*)    All other components within normal limits  CBC - Abnormal; Notable for the following components:   WBC 15.3 (*)    RDW 24.4 (*)    All other components within normal limits  GASTROINTESTINAL PANEL BY PCR, STOOL (REPLACES STOOL CULTURE)  C DIFFICILE QUICK SCREEN W PCR REFLEX    URINALYSIS, ROUTINE W REFLEX MICROSCOPIC  LACTIC ACID, PLASMA    EKG None  Radiology CT ABDOMEN PELVIS W CONTRAST  Result Date: 02/18/2023 CLINICAL DATA:  Colon cancer, prior chemotherapy. Acute abdominal pain. Dehydration and diarrhea. Right lower quadrant tenderness. * Tracking Code: BO * EXAM: CT ABDOMEN AND PELVIS WITH CONTRAST TECHNIQUE: Multidetector CT imaging of the abdomen and pelvis was performed using the standard protocol following bolus administration of intravenous contrast. RADIATION DOSE REDUCTION: This exam was performed according to the departmental dose-optimization program which includes automated exposure control, adjustment of the mA and/or kV according to patient size and/or use of iterative reconstruction technique. CONTRAST:  85mL OMNIPAQUE IOHEXOL 300 MG/ML  SOLN COMPARISON:  10/17/2022  FINDINGS: Lower chest: Unremarkable Hepatobiliary: Unremarkable Pancreas: Unremarkable Spleen: Unremarkable Adrenals/Urinary Tract: Nondistended urinary bladder, no hydronephrosis or hydroureter. There are approximately six right renal calculi, a representative lower pole calculus 0.6 cm in long axis on image 45 series 5. There are five left renal calculi, representative calculus in lower pole 0.7 cm on image 42 series 5. No ureteral or bladder calculus seen. Adrenal glands appear normal. Stomach/Bowel: Right hemicolectomy with postoperative findings including removal of the transverse colon mass. Mucosal enhancement in the sigmoid colon potentially reflecting inflammation, cannot exclude distal colitis. No dilated small bowel. Vascular/Lymphatic: There is acute thrombus in the superior mesenteric vein as shown on images 22 through 31 of series 2. No substantial involvement of the portal vein or splenic vein. Reproductive: Mild prostatomegaly. Other: Trace stranding in the adipose tissue anterior to the spleen,  likely incidental postoperative. No nodularity or mass like appearance to suggest tumor spread in this vicinity. Musculoskeletal: Degenerative disc disease and degenerative endplate findings at L3-4, L4-5, and to a lesser extent at L2-3. Lumbar spondylosis and degenerative disc disease causing multilevel impingement. Fatty left spermatic cord. IMPRESSION: 1. Acute thrombus in the superior mesenteric vein. 2. Mucosal enhancement in the sigmoid colon potentially reflecting inflammation, cannot exclude distal colitis. 3. Right hemicolectomy with postoperative findings including removal of the transverse colon mass. 4. Bilateral nephrolithiasis. 5. Mild prostatomegaly. 6. Lumbar spondylosis and degenerative disc disease causing multilevel impingement. Critical Value/emergent results were called by telephone at the time of interpretation on 02/18/2023 at 9:05 pm to provider Dr. Karene Fry, who verbally acknowledged  these results. Electronically Signed   By: Gaylyn Rong M.D.   On: 02/18/2023 21:07    Procedures Procedures    Medications Ordered in ED Medications  iohexol (OMNIPAQUE) 300 MG/ML solution 100 mL (85 mLs Intravenous Contrast Given 02/18/23 2003)    ED Course/ Medical Decision Making/ A&P                                 Medical Decision Making Amount and/or Complexity of Data Reviewed Labs: ordered. Radiology: ordered.  Risk Prescription drug management.   This patient is a 62 y.o. male who presents to the ED for concern of abdominal pain and diarrhea, this involves an extensive number of treatment options, and is a complaint that carries with it a high risk of complications and morbidity. The emergent differential diagnosis prior to evaluation includes, but is not limited to,  AAA, gastroenteritis, appendicitis, Bowel obstruction, Bowel perforation. Gastroparesis, DKA, Hernia, Inflammatory bowel disease, mesenteric ischemia, pancreatitis, peritonitis SBP, volvulus, infectious diarrhea, diverticulosis, vascular ectasia/AVM, inflammatory bowel disease, infectious colitis, mesenteric ischemia or ischemic colitis,  colorectal cancer or polyps, internal hemorrhoids, aortoenteric fistula, rectal foreign body, rectal ulceration or anal fissure.   This is not an exhaustive differential.   Past Medical History / Co-morbidities / Social History: history of right sided colon adenocarcinoma status post right hemicolectomy in 5/24 currently on chemotherapy   has a past medical history of Cancer (HCC) and Hypertension.  SDOH Screenings   Food Insecurity: No Food Insecurity (12/07/2022)  Housing: Low Risk  (12/07/2022)  Transportation Needs: No Transportation Needs (12/07/2022)  Utilities: Not At Risk (12/07/2022)  Alcohol Screen: Low Risk  (12/07/2022)  Depression (PHQ2-9): Low Risk  (12/07/2022)  Financial Resource Strain: Low Risk  (12/07/2022)  Physical Activity: Inactive (12/07/2022)  Social  Connections: Socially Integrated (12/07/2022)  Stress: No Stress Concern Present (12/07/2022)  Tobacco Use: Low Risk  (02/18/2023)    Additional history: Chart reviewed. Pertinent results include: seen at cancer center today, given fluids and told to come to the ER for evaluation, cdiff collected and is pending. Right hemicolectomy performed at [facility] with Dr. Byrd Hesselbach on 5/06  Physical Exam: Physical exam performed. The pertinent findings include: overall well appearing, RLQ TTP  Lab Tests: I ordered, and personally interpreted labs.  The pertinent results include:  WBC 15.3, Lipase 71, Lactic WNL   Imaging Studies: I ordered imaging studies including Ct abdomen pelvis. I independently visualized and interpreted imaging which showed   1. Acute thrombus in the superior mesenteric vein. 2. Mucosal enhancement in the sigmoid colon potentially reflecting inflammation, cannot exclude distal colitis. 3. Right hemicolectomy with postoperative findings including removal of the transverse colon mass. 4. Bilateral nephrolithiasis. 5. Mild prostatomegaly. 6.  Lumbar spondylosis and degenerative disc disease causing multilevel impingement.  I agree with the radiologist interpretation.   Medications: Initial plan was to heparinized this patient, however given patients complaint of bloody diarrhea, will hold off on heparin at this time  Consultations Obtained: I requested consultation with the GI on call Dr. Meridee Score,  and discussed lab and imaging findings as well as pertinent plan - they recommend: admit to medicine at [facility], they will see the patient in the morning in consultation   Disposition:  Patient will require admission for superior mesenteric vein thrombus.  He also has colitis with bloody diarrhea.  Given this, we have not started heparin.  Did consider CTA to look for a SMA occlusion, however patient's pain is mild and he is even refusing pain medication.  He is nontoxic-appearing  and his lactic is within normal limits.  Given this, occlusion deemed less likely.  Stool studies are ordered but patient has been unable to give a sample so far.  I have discussed these findings with the patient and he is understanding and in agreement with this plan.  Discussed patient with hospitalist Dr. Janalyn Shy who agrees to admit.  I discussed this case with my attending physician Dr. Karene Fry who cosigned this note including patient's presenting symptoms, physical exam, and planned diagnostics and interventions. Attending physician stated agreement with plan or made changes to plan which were implemented.     Final Clinical Impression(s) / ED Diagnoses Final diagnoses:  Superior mesenteric vein thrombosis (HCC)  Bloody diarrhea  Colitis  Right lower quadrant abdominal pain    Rx / DC Orders ED Discharge Orders     None         Vear Clock 02/19/23 0031    Ernie Avena, MD 02/19/23 [phone]

## 2023-02-18 NOTE — Patient Instructions (Signed)

## 2023-02-18 NOTE — ED Triage Notes (Signed)
Seen at Cancer center today for dehydration, fluids. Having diarrhea. Started friday Tender abdo,RLQ sent for CT Surgery in may

## 2023-02-18 NOTE — Progress Notes (Signed)
Plan of Care Note for accepted transfer  Patient: [name] [name]              ZOX:[id number]  DOA: 02/18/2023     Facility requesting transfer: [facility] emergency department Requesting Provider: Myer Peer, MD  Reason for transfer: Superior mesenteric vein thrombosis and bloody diarrhea.  Facility course: [name] [name] is a 62 y.o. male.  Stage III colon cancer status post right hemicolectomy 10/2022 who has been referred from oncology clinic today as he was complaining about 20 episodes of loose stool, abdominal pain, nausea and hematemesis.  At the oncology clinic C. difficile was ordered and the results are pending.  At ED initial presentation patient's vitals are stable.  ED workup, CBC showed leukocytosis 15.3, hemoglobin 13, hematocrit 39 and platelet 159. Lipase/elevated 71. CMP unremarkable. Lactic acid 1.8.  CT abdomen pelvis showed: 1. Acute thrombus in the superior mesenteric vein. 2. Mucosal enhancement in the sigmoid colon potentially reflecting inflammation, cannot exclude distal colitis. 3. Right hemicolectomy with postoperative findings including removal of the transverse colon mass. 4. Bilateral nephrolithiasis. 5. Mild prostatomegaly. 6. Lumbar spondylosis and degenerative disc disease causing multilevel impingement.  --ED physician discussed case with [facility] Dr. Bosie Clos recommended to hold the anticoagulation in the setting of bloody diarrhea. --I have spoke with GI as well Dr. Bosie Clos recommended to reach out oncology for anticoagulation guidance.  Plan of care: Continue to monitor H&H, follow-up with C. difficile and GI panel.  Need to reach out oncology upon arrival to hospital.  The patient is accepted for admission to Medical-Telemetry unit, at [facility].    Check [email or web address] for on-call coverage.  [facility] will assume care on arrival to accepting facility. Until arrival, medical decision making responsibilities remain with the EDP.   However, [facility] available 24/7 for questions and assistance.   Nursing staff please page [facility] Admits and Consults [phone]) as soon as the patient arrives to the hospital.    Author: Tereasa Coop, MD  02/18/2023  [facility]

## 2023-02-18 NOTE — Telephone Encounter (Signed)
Diarrhea all weekend over 20/day and has had #8 thus far today despite Lomotil 8/day and Imodium 8/day. Abdominal cramping and cramping pain in rectal area as well. Stools are liquid and blood on tissue when he wipes and in the toilet. Reports loose stool even after clear liquids. Denies any fever. Had mild nausea well controlled w/promethazine. Concerned he is dehydrated and needs IVF and asking to be seen today.

## 2023-02-18 NOTE — ED Provider Notes (Incomplete)
[name] Provider Note   CSN: [id number] Arrival date & time: 02/18/23  1658     History {Add pertinent medical, surgical, social history, OB history to HPI:1} Chief Complaint  Patient presents with  . Abdominal Pain    [name] [name] is a 62 y.o. male.  Patient with history of right sided colon adenocarcinoma status post right hemicolectomy in 5/24 currently on chemotherapy presents today with complaints of abdominal pain and diarrhea. He states that his last chemo infusion was last week. He has had 3 days of diarrhea that is bloody. He notes that he has blood in the toilet and when he wipes. He is not anticoagulated. He notes that he has been having up to 20 bouts of diarrhea per day with RLQ abdominal pain as well. He   The history is provided by the patient. No language interpreter was used.  Abdominal Pain      Home Medications Prior to Admission medications   Medication Sig Start Date End Date Taking? Authorizing Provider  cloNIDine (CATAPRES) 0.1 MG tablet Take 1 tablet (0.1 mg total) by mouth every 6 (six) hours as needed. Patient not taking: Reported on 12/07/2022 02/14/17   Azalia Bilis, MD  diphenoxylate-atropine (LOMOTIL) 2.5-0.025 MG tablet Take 1-2 tablets by mouth 4 (four) times daily as needed for diarrhea or loose stools (Maximum of 8/day). 02/04/23   Ladene Artist, MD  ibuprofen (ADVIL,MOTRIN) 200 MG tablet Take 600 mg by mouth every 8 (eight) hours as needed for mild pain. Pain/inflammation    [provider]  lidocaine-prilocaine (EMLA) cream Apply 1 Application topically as needed. 12/18/22   Rana Snare, NP  loperamide (IMODIUM) 2 MG capsule Take 1-2 capsules (2-4 mg total) by mouth as needed for diarrhea or loose stools (up to 8/day). 02/04/23   Ladene Artist, MD  ondansetron (ZOFRAN) 8 MG tablet Take 1 tablet (8 mg total) by mouth every 8 (eight) hours as needed for nausea or vomiting (Starting day 3  after chemo as needed for nausea). Patient not taking: Reported on 01/30/2023 12/18/22   Rana Snare, NP  OVER THE COUNTER MEDICATION Apply 1 application  topically daily as needed (pain). "mg12 cream"    [provider]  promethazine (PHENERGAN) 12.5 MG tablet Take 1 tablet (12.5 mg total) by mouth every 6 (six) hours as needed for nausea or vomiting. Patient not taking: Reported on 01/30/2023 12/18/22   Rana Snare, NP  tamsulosin (FLOMAX) 0.4 MG CAPS capsule Take 1 capsule (0.4 mg total) by mouth 2 (two) times daily. Patient not taking: Reported on 01/30/2023 04/13/18   Roxy Horseman, PA-C      Allergies    Compazine [prochlorperazine edisylate] and Prochlorperazine    Review of Systems   Review of Systems  Gastrointestinal:  Positive for abdominal pain.    Physical Exam Updated Vital Signs BP (!) 133/113   Pulse 91   Temp 98.6 F (37 C) (Oral)   Resp 16   SpO2 99%  Physical Exam  ED Results / Procedures / Treatments   Labs (all labs ordered are listed, but only abnormal results are displayed) Labs Reviewed  LIPASE, BLOOD - Abnormal; Notable for the following components:      Result Value   Lipase 71 (*)    All other components within normal limits  COMPREHENSIVE METABOLIC PANEL - Abnormal; Notable for the following components:   Alkaline Phosphatase 130 (*)    All other components  within normal limits  CBC - Abnormal; Notable for the following components:   WBC 15.3 (*)    RDW 24.4 (*)    All other components within normal limits  GASTROINTESTINAL PANEL BY PCR, STOOL (REPLACES STOOL CULTURE)  C DIFFICILE QUICK SCREEN W PCR REFLEX    URINALYSIS, ROUTINE W REFLEX MICROSCOPIC  LACTIC ACID, PLASMA  LACTIC ACID, PLASMA    EKG None  Radiology CT ABDOMEN PELVIS W CONTRAST  Result Date: 02/18/2023 CLINICAL DATA:  Colon cancer, prior chemotherapy. Acute abdominal pain. Dehydration and diarrhea. Right lower quadrant tenderness. * Tracking Code: BO * EXAM:  CT ABDOMEN AND PELVIS WITH CONTRAST TECHNIQUE: Multidetector CT imaging of the abdomen and pelvis was performed using the standard protocol following bolus administration of intravenous contrast. RADIATION DOSE REDUCTION: This exam was performed according to the departmental dose-optimization program which includes automated exposure control, adjustment of the mA and/or kV according to patient size and/or use of iterative reconstruction technique. CONTRAST:  85mL OMNIPAQUE IOHEXOL 300 MG/ML  SOLN COMPARISON:  10/17/2022 FINDINGS: Lower chest: Unremarkable Hepatobiliary: Unremarkable Pancreas: Unremarkable Spleen: Unremarkable Adrenals/Urinary Tract: Nondistended urinary bladder, no hydronephrosis or hydroureter. There are approximately six right renal calculi, a representative lower pole calculus 0.6 cm in long axis on image 45 series 5. There are five left renal calculi, representative calculus in lower pole 0.7 cm on image 42 series 5. No ureteral or bladder calculus seen. Adrenal glands appear normal. Stomach/Bowel: Right hemicolectomy with postoperative findings including removal of the transverse colon mass. Mucosal enhancement in the sigmoid colon potentially reflecting inflammation, cannot exclude distal colitis. No dilated small bowel. Vascular/Lymphatic: There is acute thrombus in the superior mesenteric vein as shown on images 22 through 31 of series 2. No substantial involvement of the portal vein or splenic vein. Reproductive: Mild prostatomegaly. Other: Trace stranding in the adipose tissue anterior to the spleen, likely incidental postoperative. No nodularity or mass like appearance to suggest tumor spread in this vicinity. Musculoskeletal: Degenerative disc disease and degenerative endplate findings at L3-4, L4-5, and to a lesser extent at L2-3. Lumbar spondylosis and degenerative disc disease causing multilevel impingement. Fatty left spermatic cord. IMPRESSION: 1. Acute thrombus in the superior  mesenteric vein. 2. Mucosal enhancement in the sigmoid colon potentially reflecting inflammation, cannot exclude distal colitis. 3. Right hemicolectomy with postoperative findings including removal of the transverse colon mass. 4. Bilateral nephrolithiasis. 5. Mild prostatomegaly. 6. Lumbar spondylosis and degenerative disc disease causing multilevel impingement. Critical Value/emergent results were called by telephone at the time of interpretation on 02/18/2023 at 9:05 pm to provider Dr. Karene Fry, who verbally acknowledged these results. Electronically Signed   By: Gaylyn Rong M.D.   On: 02/18/2023 21:07    Procedures Procedures  {Document cardiac monitor, telemetry assessment procedure when appropriate:1}  Medications Ordered in ED Medications  iohexol (OMNIPAQUE) 300 MG/ML solution 100 mL (85 mLs Intravenous Contrast Given 02/18/23 2003)    ED Course/ Medical Decision Making/ A&P   {   Click here for ABCD2, HEART and other calculatorsREFRESH Note before signing :1}                              Medical Decision Making Amount and/or Complexity of Data Reviewed Labs: ordered. Radiology: ordered.  Risk Prescription drug management.   ***  {Document critical care time when appropriate:1} {Document review of labs and clinical decision tools ie heart score, Chads2Vasc2 etc:1}  {Document your independent review of radiology images,  and any outside records:1} {Document your discussion with family members, caretakers, and with consultants:1} {Document social determinants of health affecting pt's care:1} {Document your decision making why or why not admission, treatments were needed:1} Final Clinical Impression(s) / ED Diagnoses Final diagnoses:  Superior mesenteric vein thrombosis (HCC)  Bloody diarrhea  Colitis  Right lower quadrant abdominal pain    Rx / DC Orders ED Discharge Orders     None

## 2023-02-18 NOTE — Progress Notes (Signed)
[facility] OFFICE PROGRESS NOTE   Diagnosis: Colon cancer  INTERVAL HISTORY:   [name] [name] returns prior to scheduled follow-up for evaluation of diarrhea.  He reports onset of diarrhea beginning day of pump discontinuation.  He estimates up to 20 loose stools a day stating he is having bowel movements "day and night".  The stool appears bloody.  He is taking max doses of Lomotil and Imodium with no improvement.  He complains of pain at the right lower abdomen for the past 2 days, increased today.  No fever.  No mouth sores.  No hand or foot pain or redness.  Mild nausea controlled with promethazine.  He is trying to push fluids.  Objective:  Vital signs in last 24 hours:  Temperature 98.9, heart rate 105, respirations 18, blood pressure 110/89, oxygen saturation 98% on room air.    HEENT: [name] [name] coating over tongue.  Mouth appears moist. Resp: Lungs clear bilaterally. Cardio: Regular rate and rhythm. GI: Abdomen with tenderness at the right lower abdomen.  Bowel sounds present. Vascular: No leg edema. Neuro: Alert and oriented.  Skin: Palms without erythema.  Skin turgor intact. Port-A-Cath without erythema.  Lab Results:  Lab Results  Component Value Date   WBC 3.6 (L) 02/13/2023   HGB 11.6 (L) 02/13/2023   HCT 35.8 (L) 02/13/2023   MCV 87.7 02/13/2023   PLT 124 (L) 02/13/2023   NEUTROABS 1.6 (L) 02/13/2023    Imaging:  No results found.  Medications: I have reviewed the patient's current medications.  Assessment/Plan: Colon cancer, stage IIIb (pT4a, pN1a) Colonoscopy 10/10/2022 -circumferential fungating mass thought to represent the proximal descending colon at 70 cm from the anus that could not be passed with the scope.  Biopsy showed adenocarcinoma, at least intramucosal; intact expression of all 4 mismatch repair proteins.   CT scans 10/17/2022-2 noncalcified nodules adjacent to the horizontal fissure within the right thorax between the right upper  lobe and right middle lobe measuring 11 and 7 mm in size; no other pulmonary nodules; 10 mm right paratracheal lymph node liver was negative for suspicious findings; spleen with 2 small densities; a constricting lesion of the distal transverse colon suspected seen to the left of midline adjacent to the splenic flexure measuring 3.6 cm in length; significant narrowing of the lumen of the bowel at this location with bowel wall thickening; hazy stranding within the adjacent mesenteric fat; along the superior margin of the lesion and exophytic 12 mm density possibly representing local invasion and/or adjacent adenopathy.   CEA 10/17/2022 3.0 (normal range for non-smokers less than 3.0, smokers less than 5.0) Laparoscopic extended right hemicolectomy 11/05/2022 by Dr. Byrd Hesselbach.  Final pathology showed 6 cm invasive moderately differentiated adenocarcinoma in the distal transverse colon, invading the visceral peritoneum; resection margins negative; positive lymphovascular and perineural invasion; 1 out of 26 lymph nodes positive for carcinoma; no tumor deposits identified; appendiceal serrated lesion with low-grade dysplasia; pT4a, pN1a Cycle 1 FOLFOX 12/19/2022 Cycle 2 FOLFOX 01/02/2023 Cycle 3 FOLFOX 01/16/2023 Cycle 4 FOLFOX 01/30/2023 Cycle 5 FOLFOX 02/13/2023-5-FU leucovorin dose reduced secondary to diarrhea   Iron deficiency anemia-he reports receiving IV iron prior to the colon surgery 12/18/2022 ferritin 11, hemoglobin 11.1/MCV 79 12/18/2022 ferrous sulfate 1 tablet daily Hypertension History of kidney stones Colonoscopy 10/10/2022-mass could not be passed with the scope Port-A-Cath placement, Interventional Radiology, 12/17/2022  Disposition: [name] [name] completed cycle 5 FOLFOX 02/13/2023.  He has been having severe diarrhea with blood since 02/15/2023.  On exam he has  pain at the right lower abdomen.  He began experiencing shaking chills while in the office.  He is afebrile.  C. difficile test is pending.  We  are referring him to the emergency department for further evaluation.  Patient seen with Dr. Truett Perna.  Lonna Cobb ANP/GNP-BC   02/18/2023  3:36 PM  This was a shared visit with Lonna Cobb.  [name] [name] was interviewed and examined.  He is now at day 6 following cycle 5 FOLFOX.  He developed frequent diarrhea beginning on day 3.  He has abdominal pain today.  He developed rigors while in the office today.  He most likely has chemotherapy induced enteritis, but we are concerned he could have an infection.  We checked a stool for C. difficile.  Received intravenous fluids at the cancer center.  He will be referred to the emergency room for supportive care and to consider abdominal imaging.  He will likely require admission to the hospital.  Diarrhea has persisted despite antidiarrhea medications.  I was present for greater than 50% of today's visit.  I performed medical decision making.  Mancel Bale, MD

## 2023-02-18 NOTE — Telephone Encounter (Signed)
Will see him today for labs/stool sample, fluids and NP will see him in treatment area. Mr. Muramoto made aware and is getting him ready now.

## 2023-02-19 ENCOUNTER — Encounter (HOSPITAL_COMMUNITY): Payer: Self-pay | Admitting: Family Medicine

## 2023-02-19 ENCOUNTER — Ambulatory Visit: Payer: BC Managed Care – PPO

## 2023-02-19 DIAGNOSIS — N400 Enlarged prostate without lower urinary tract symptoms: Secondary | ICD-10-CM | POA: Diagnosis present

## 2023-02-19 DIAGNOSIS — K55069 Acute infarction of intestine, part and extent unspecified: Secondary | ICD-10-CM | POA: Diagnosis not present

## 2023-02-19 DIAGNOSIS — Z888 Allergy status to other drugs, medicaments and biological substances status: Secondary | ICD-10-CM | POA: Diagnosis not present

## 2023-02-19 DIAGNOSIS — K55019 Acute (reversible) ischemia of small intestine, extent unspecified: Secondary | ICD-10-CM | POA: Diagnosis present

## 2023-02-19 DIAGNOSIS — K921 Melena: Secondary | ICD-10-CM | POA: Diagnosis present

## 2023-02-19 DIAGNOSIS — Z79899 Other long term (current) drug therapy: Secondary | ICD-10-CM | POA: Diagnosis not present

## 2023-02-19 DIAGNOSIS — R1031 Right lower quadrant pain: Secondary | ICD-10-CM | POA: Diagnosis not present

## 2023-02-19 DIAGNOSIS — K529 Noninfective gastroenteritis and colitis, unspecified: Secondary | ICD-10-CM | POA: Diagnosis present

## 2023-02-19 DIAGNOSIS — I10 Essential (primary) hypertension: Secondary | ICD-10-CM | POA: Diagnosis present

## 2023-02-19 DIAGNOSIS — E860 Dehydration: Secondary | ICD-10-CM | POA: Diagnosis present

## 2023-02-19 DIAGNOSIS — D509 Iron deficiency anemia, unspecified: Secondary | ICD-10-CM | POA: Diagnosis present

## 2023-02-19 DIAGNOSIS — T451X5A Adverse effect of antineoplastic and immunosuppressive drugs, initial encounter: Secondary | ICD-10-CM | POA: Diagnosis present

## 2023-02-19 DIAGNOSIS — K922 Gastrointestinal hemorrhage, unspecified: Secondary | ICD-10-CM | POA: Diagnosis present

## 2023-02-19 DIAGNOSIS — Z9049 Acquired absence of other specified parts of digestive tract: Secondary | ICD-10-CM | POA: Diagnosis not present

## 2023-02-19 DIAGNOSIS — C189 Malignant neoplasm of colon, unspecified: Secondary | ICD-10-CM | POA: Diagnosis present

## 2023-02-19 LAB — CBC WITH DIFFERENTIAL/PLATELET
Abs Immature Granulocytes: 0.13 10*3/uL — ABNORMAL HIGH (ref 0.00–0.07)
Basophils Absolute: 0.1 10*3/uL (ref 0.0–0.1)
Basophils Relative: 1 %
Eosinophils Absolute: 0.3 10*3/uL (ref 0.0–0.5)
Eosinophils Relative: 2 %
HCT: 39.3 % (ref 39.0–52.0)
Hemoglobin: 12.9 g/dL — ABNORMAL LOW (ref 13.0–17.0)
Immature Granulocytes: 1 %
Lymphocytes Relative: 10 %
Lymphs Abs: 1.3 10*3/uL (ref 0.7–4.0)
MCH: 29 pg (ref 26.0–34.0)
MCHC: 32.8 g/dL (ref 30.0–36.0)
MCV: 88.3 fL (ref 80.0–100.0)
Monocytes Absolute: 1.4 10*3/uL — ABNORMAL HIGH (ref 0.1–1.0)
Monocytes Relative: 12 %
Neutro Abs: 9 10*3/uL — ABNORMAL HIGH (ref 1.7–7.7)
Neutrophils Relative %: 74 %
Platelets: 157 10*3/uL (ref 150–400)
RBC: 4.45 MIL/uL (ref 4.22–5.81)
RDW: 24.2 % — ABNORMAL HIGH (ref 11.5–15.5)
WBC: 12.1 10*3/uL — ABNORMAL HIGH (ref 4.0–10.5)
nRBC: 0.2 % (ref 0.0–0.2)

## 2023-02-19 LAB — C DIFFICILE QUICK SCREEN W PCR REFLEX
C Diff antigen: NEGATIVE
C Diff interpretation: NOT DETECTED
C Diff toxin: NEGATIVE

## 2023-02-19 LAB — HEPARIN LEVEL (UNFRACTIONATED): Heparin Unfractionated: 0.42 [IU]/mL (ref 0.30–0.70)

## 2023-02-19 MED ORDER — SODIUM CHLORIDE 0.9% FLUSH: 10.0000 mL | INTRAVENOUS | Status: DC | PRN

## 2023-02-19 MED ORDER — HEPARIN (PORCINE) 25000 UT/250ML-% IV SOLN
1100.0000 [IU]/h | INTRAVENOUS | Status: DC
  Administered 2023-02-19 – 2023-02-21 (×4): 1200 [IU]/h via INTRAVENOUS
  Filled 2023-02-19 (×3): qty 250

## 2023-02-19 MED ORDER — OXYCODONE HCL 5 MG PO TABS
5.0000 mg | ORAL_TABLET | ORAL | Status: DC | PRN
  Administered 2023-02-20 – 2023-02-21 (×2): 5 mg via ORAL
  Filled 2023-02-19 (×2): qty 1

## 2023-02-19 MED ORDER — DEXTROSE IN LACTATED RINGERS 5 % IV SOLN: INTRAVENOUS | Status: DC

## 2023-02-19 MED ORDER — DIPHENOXYLATE-ATROPINE 2.5-0.025 MG PO TABS
1.0000 | ORAL_TABLET | Freq: Four times a day (QID) | ORAL | Status: DC | PRN
  Administered 2023-02-20: 1 via ORAL
  Filled 2023-02-19: qty 1

## 2023-02-19 MED ORDER — MORPHINE SULFATE (PF) 2 MG/ML IV SOLN: 2.0000 mg | INTRAVENOUS | Status: DC | PRN

## 2023-02-19 MED ORDER — CHLORHEXIDINE GLUCONATE CLOTH 2 % EX PADS
6.0000 | MEDICATED_PAD | Freq: Every day | CUTANEOUS | Status: DC
  Administered 2023-02-20: 6 via TOPICAL

## 2023-02-19 MED ORDER — LACTATED RINGERS IV SOLN: INTRAVENOUS | Status: DC

## 2023-02-19 NOTE — Progress Notes (Signed)
ANTICOAGULATION CONSULT NOTE - Initial Consult  Pharmacy Consult for Heparin Indication: SMV thrombosis  Allergies  Allergen Reactions   Compazine [Prochlorperazine Edisylate] Anaphylaxis and Swelling    Tongue swelling   Prochlorperazine Other (See Comments)    Trouble swallowing.     Patient Measurements:   Heparin Dosing Weight: 76 kg  Vital Signs: Temp: 98 F (36.7 C) (08/20 0621) BP: 150/85 (08/20 0620) Pulse Rate: 82 (08/20 0621)  Labs: Recent Labs    02/18/23 1340 02/18/23 1530 02/18/23 1725  HGB  --  13.0 13.8  HCT  --  39.8 41.3  PLT  --  159 169  CREATININE 0.91  --  0.84    Estimated Creatinine Clearance: 88.6 mL/min (by C-G formula based on SCr of 0.84 mg/dL).   Medical History: Past Medical History:  Diagnosis Date   Cancer (HCC)    Hypertension     Assessment: 63 YOM with acute superior mesenteric vein thrombosis. Received cycle 5 of FOLFOX recently as adjuvant therapy for stage 3 colon cancer. Also experiencing rectal bleeding. Not on anticoagulation PTA. Pharmacy consulted to start heparin without bolus given active bleed.  Would target lower goal of 0.3-0.5 given rectal bleeding.  Goal of Therapy:  Heparin level 0.3-0.5 units/ml Monitor platelets by anticoagulation protocol: Yes   Plan:  Start heparin 1200 units/hr without bolus Check heparin level in 6 hours Daily CBC and heparin level  Eldridge Scot, PharmD, BCCCP Clinical Pharmacist 02/19/2023, 10:21 AM

## 2023-02-19 NOTE — ED Notes (Signed)
[name] [name] with cl called for transport

## 2023-02-19 NOTE — Progress Notes (Signed)
ANTICOAGULATION CONSULT NOTE  Pharmacy Consult for Heparin Indication: SMV thrombosis  Allergies  Allergen Reactions   Compazine [Prochlorperazine Edisylate] Anaphylaxis and Swelling    Tongue swelling   Prochlorperazine Other (See Comments)    Trouble swallowing.     Patient Measurements:   Heparin Dosing Weight: 76 kg  Vital Signs: Temp: 98.8 F (37.1 C) (08/20 1755) Temp Source: Oral (08/20 1755) BP: 139/85 (08/20 1755) Pulse Rate: 70 (08/20 1755)  Labs: Recent Labs    02/18/23 1340 02/18/23 1530 02/18/23 1725 02/19/23 1050 02/19/23 1905  HGB  --  13.0 13.8 12.9*  --   HCT  --  39.8 41.3 39.3  --   PLT  --  159 169 157  --   HEPARINUNFRC  --   --   --   --  0.42  CREATININE 0.91  --  0.84  --   --     Estimated Creatinine Clearance: 88.6 mL/min (by C-G formula based on SCr of 0.84 mg/dL).  Assessment: 70 YOM with acute superior mesenteric vein thrombosis. Received cycle 5 of FOLFOX recently as adjuvant therapy for stage 3 colon cancer. Also experiencing rectal bleeding. Not on anticoagulation PTA. Pharmacy consulted to start heparin without bolus given active bleed.  Would target lower goal of 0.3-0.5 given rectal bleeding.  Today, 02/19/2023: Hgb minimally decreased on D2 of admission; reassuring given reports of rectal bleeding; Plt stable WNL First heparin level therapeutic and within lower goal range SCr stable WNL (at baseline) No new rectal bleeding noted per RN this shift. No infusion issues  Goal of Therapy:  Heparin level 0.3-0.5 units/ml Monitor platelets by anticoagulation protocol: Yes   Plan:  Continue heparin infusion at 1200 units/hr Check heparin level in 6 hours Daily CBC and heparin level F/u plans for long-term anticoagulation  Bernadene Person, PharmD, BCPS [phone] 02/19/2023, 8:15 PM

## 2023-02-19 NOTE — Progress Notes (Signed)
IP PROGRESS NOTE  Subjective:   [name] [name] was referred to the emergency room yesterday with diarrhea, rectal bleeding, chills, and abdominal pain.  He reports diarrhea persists, but has slowed.  He continues to have some bleeding when he has diarrhea.  Abdominal pain has improved.  No further chills.  Objective: Vital signs in last 24 hours: Blood pressure (!) 150/85, pulse 82, temperature 98 F (36.7 C), resp. rate 16, SpO2 93%.  Intake/Output from previous day: No intake/output data recorded.  Physical Exam:  HEENT: No thrush or ulcers Lungs: Clear bilaterally Cardiac: Regular rate and rhythm Abdomen: Active bowel sounds, soft, no hepatosplenomegaly, no mass, mild tenderness in the right lower abdomen Extremities: No leg edema Skin: Palms without erythema  Portacath/PICC-without erythema  Lab Results: Recent Labs    02/18/23 1530 02/18/23 1725  WBC 15.3* 15.3*  HGB 13.0 13.8  HCT 39.8 41.3  PLT 159 169    BMET Recent Labs    02/18/23 1340 02/18/23 1725  NA 136 138  K 4.3 4.2  CL 101 105  CO2 24 23  GLUCOSE 94 92  BUN 14 14  CREATININE 0.91 0.84  CALCIUM 9.4 9.3    Lab Results  Component Value Date   CEA 1.63 12/18/2022    Studies/Results: CT ABDOMEN PELVIS W CONTRAST  Result Date: 02/18/2023 CLINICAL DATA:  Colon cancer, prior chemotherapy. Acute abdominal pain. Dehydration and diarrhea. Right lower quadrant tenderness. * Tracking Code: BO * EXAM: CT ABDOMEN AND PELVIS WITH CONTRAST TECHNIQUE: Multidetector CT imaging of the abdomen and pelvis was performed using the standard protocol following bolus administration of intravenous contrast. RADIATION DOSE REDUCTION: This exam was performed according to the departmental dose-optimization program which includes automated exposure control, adjustment of the mA and/or kV according to patient size and/or use of iterative reconstruction technique. CONTRAST:  85mL OMNIPAQUE IOHEXOL 300 MG/ML  SOLN COMPARISON:   10/17/2022 FINDINGS: Lower chest: Unremarkable Hepatobiliary: Unremarkable Pancreas: Unremarkable Spleen: Unremarkable Adrenals/Urinary Tract: Nondistended urinary bladder, no hydronephrosis or hydroureter. There are approximately six right renal calculi, a representative lower pole calculus 0.6 cm in long axis on image 45 series 5. There are five left renal calculi, representative calculus in lower pole 0.7 cm on image 42 series 5. No ureteral or bladder calculus seen. Adrenal glands appear normal. Stomach/Bowel: Right hemicolectomy with postoperative findings including removal of the transverse colon mass. Mucosal enhancement in the sigmoid colon potentially reflecting inflammation, cannot exclude distal colitis. No dilated small bowel. Vascular/Lymphatic: There is acute thrombus in the superior mesenteric vein as shown on images 22 through 31 of series 2. No substantial involvement of the portal vein or splenic vein. Reproductive: Mild prostatomegaly. Other: Trace stranding in the adipose tissue anterior to the spleen, likely incidental postoperative. No nodularity or mass like appearance to suggest tumor spread in this vicinity. Musculoskeletal: Degenerative disc disease and degenerative endplate findings at L3-4, L4-5, and to a lesser extent at L2-3. Lumbar spondylosis and degenerative disc disease causing multilevel impingement. Fatty left spermatic cord. IMPRESSION: 1. Acute thrombus in the superior mesenteric vein. 2. Mucosal enhancement in the sigmoid colon potentially reflecting inflammation, cannot exclude distal colitis. 3. Right hemicolectomy with postoperative findings including removal of the transverse colon mass. 4. Bilateral nephrolithiasis. 5. Mild prostatomegaly. 6. Lumbar spondylosis and degenerative disc disease causing multilevel impingement. Critical Value/emergent results were called by telephone at the time of interpretation on 02/18/2023 at 9:05 pm to provider Dr. Karene Fry, who verbally  acknowledged these results. Electronically Signed   By:  Gaylyn Rong M.D.   On: 02/18/2023 21:07    Medications: I have reviewed the patient's current medications.  Assessment/Plan: Colon cancer, stage IIIb (pT4a, pN1a) Colonoscopy 10/10/2022 -circumferential fungating mass thought to represent the proximal descending colon at 70 cm from the anus that could not be passed with the scope.  Biopsy showed adenocarcinoma, at least intramucosal; intact expression of all 4 mismatch repair proteins.   CT scans 10/17/2022-2 noncalcified nodules adjacent to the horizontal fissure within the right thorax between the right upper lobe and right middle lobe measuring 11 and 7 mm in size; no other pulmonary nodules; 10 mm right paratracheal lymph node liver was negative for suspicious findings; spleen with 2 small densities; a constricting lesion of the distal transverse colon suspected seen to the left of midline adjacent to the splenic flexure measuring 3.6 cm in length; significant narrowing of the lumen of the bowel at this location with bowel wall thickening; hazy stranding within the adjacent mesenteric fat; along the superior margin of the lesion and exophytic 12 mm density possibly representing local invasion and/or adjacent adenopathy.   CEA 10/17/2022 3.0 (normal range for non-smokers less than 3.0, smokers less than 5.0) Laparoscopic extended right hemicolectomy 11/05/2022 by Dr. Byrd Hesselbach.  Final pathology showed 6 cm invasive moderately differentiated adenocarcinoma in the distal transverse colon, invading the visceral peritoneum; resection margins negative; positive lymphovascular and perineural invasion; 1 out of 26 lymph nodes positive for carcinoma; no tumor deposits identified; appendiceal serrated lesion with low-grade dysplasia; pT4a, pN1a Cycle 1 FOLFOX 12/19/2022 Cycle 2 FOLFOX 01/02/2023 Cycle 3 FOLFOX 01/16/2023 Cycle 4 FOLFOX 01/30/2023 Cycle 5 FOLFOX 02/13/2023-5-FU leucovorin dose reduced secondary  to diarrhea   Iron deficiency anemia-he reports receiving IV iron prior to the colon surgery 12/18/2022 ferritin 11, hemoglobin 11.1/MCV 79 12/18/2022 ferrous sulfate 1 tablet daily Hypertension History of kidney stones Colonoscopy 10/10/2022-mass could not be passed with the scope Port-A-Cath placement, Interventional Radiology, 12/17/2022 Admission 02/18/2023 with abdominal pain and diarrhea CT abdomen/pelvis 02/18/2023-multiple renal calculi, mucosa enhancement in the sigmoid colon, "acute "thrombus in the SMV with no involvement of the portal or splenic vein   [name] [name] is now at day 7 following cycle 5 FOLFOX given as adjuvant therapy for stage III colon cancer.  He has diarrhea.  It is unclear whether the diarrhea is secondary to chemotherapy induced enteritis or mesenteric ischemia from an SVC thrombus.  He reports intermittent bleeding when he has diarrhea.  The bleeding could be related to mesenteric ischemia or another etiology. He had significant diarrhea following the previous cycle of FOLFOX, so I suspect the diarrhea is largely secondary to chemotherapy.  The abdominal pain may be related to chemotherapy enteritis or the SMV thrombosis.  The hemoglobin was normal yesterday.  I have favor initiating anticoagulation therapy with heparin.  He can be monitored for a few days and if the hemoglobin is stable he can be converted to apixaban.  The SMV thrombosis is likely related to recent surgery and chemotherapy.  We will consider additional evaluation for a hypercoagulation syndrome.  Recommendations: CBC Start heparin anticoagulation, no bolus Monitor for GI bleeding, GI evaluation-would consider sigmoidoscopy for persistent bleeding Oncology will follow Mr. Beebe in the hospital and outpatient follow-up will be scheduled at the Cancer center.    LOS: 0 days   Thornton Papas, MD   02/19/2023, 9:05 AM

## 2023-02-19 NOTE — ED Notes (Signed)
Report called to inpatient RN.

## 2023-02-19 NOTE — H&P (Signed)
HPI  [name] [name]:[id number] DOB: 1960/10/10 DOA: 02/18/2023  PCP: Rebekah Chesterfield, NP   Chief Complaint:   Severe abdominal pain  HPI:   62 year old male known right-sided adenocarcinoma stage IIIb diagnosed on colonoscopy 10/10/2022 status post hemicolectomy 11/05/2022 Dr. Tristan Schroeder FOLFOX as well as leucovorin reduced dose History of kidney stones Previous episodic vertigo Last seen by medical oncologist Dr. Truett Perna 02/13/2023 with diarrhea after cycle 7/31 lasting 1 week not relieved by Imodium only partial relief starting Lomotil-5-FU bolus and infusion with decreased and he completed cycle 5 On follow-up8/19 8/19 he estimated 20 loose stool.today  bloody appearance MaxioCel multiple Imodium-- RLQ pain  he also had shaking chills when seen  on subsequent follow-up with Dr. Truett Perna on 8/20 AM it was noted that patient had intermittent bleeding again and patient was found on CT scan to have SMV thrombosis related to recent surgery and chemo  He was seen expediently in the ED and plans were made for transfer to [facility] was seen subsequently on the next day 8/20 by Dr. Truett Perna and because of SMV thrombosis he was started on IV heparin  Goes on to tell me that he completed the fourth cycle of chemo on 8/14 and had the worst side effects that he has ever had inclusive of protracted diarrhea and constipation (usually lasts only 5 to 7 days but this time lasted much longer) and this time had dark stool with blood in it  To me does not describe any of the below  Review of Systems:  No tenesmus no nausea no vomiting no fever no chills no sick contacts no ill contacts no cough no cold no constitutional symptoms  Pertinent +'s: Pertinent -"s:  ED Course: As per ED note   Past Medical History:  Diagnosis Date   Cancer (HCC)    Hypertension    Past Surgical History:  Procedure Laterality Date   ABDOMINAL SURGERY     BACK SURGERY     bulging disc repair   HAND  SURGERY     IR IMAGING GUIDED PORT INSERTION  12/17/2022   KNEE ARTHROSCOPY     LITHOTRIPSY      reports that he has never smoked. He has never used smokeless tobacco. He reports that he does not drink alcohol and does not use drugs.  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    History reviewed. No pertinent family history. Prior to Admission medications   Medication Sig Start Date End Date Taking? Authorizing Provider  diphenoxylate-atropine (LOMOTIL) 2.5-0.025 MG tablet Take 1-2 tablets by mouth 4 (four) times daily as needed for diarrhea or loose stools (Maximum of 8/day). 02/04/23  Yes Ladene Artist, MD  lidocaine-prilocaine (EMLA) cream Apply 1 Application topically as needed. 12/18/22  Yes Rana Snare, NP  loperamide (IMODIUM) 2 MG capsule Take 1-2 capsules (2-4 mg total) by mouth as needed for diarrhea or loose stools (up to 8/day). 02/04/23  Yes Ladene Artist, MD  cloNIDine (CATAPRES) 0.1 MG tablet Take 1 tablet (0.1 mg total) by mouth every 6 (six) hours as needed. Patient not taking: Reported on 12/07/2022 02/14/17   Azalia Bilis, MD  ibuprofen (ADVIL,MOTRIN) 200 MG tablet Take 600 mg by mouth every 8 (eight) hours as  needed for mild pain. Pain/inflammation    [provider]  ondansetron (ZOFRAN) 8 MG tablet Take 1 tablet (8 mg total) by mouth every 8 (eight) hours as needed for nausea or vomiting (Starting day 3 after chemo as needed for nausea). Patient not taking: Reported on 01/30/2023 12/18/22   Rana Snare, NP  promethazine (PHENERGAN) 12.5 MG tablet Take 1 tablet (12.5 mg total) by mouth every 6 (six) hours as needed for nausea or vomiting. Patient not taking: Reported on 01/30/2023 12/18/22   Rana Snare, NP  tamsulosin (FLOMAX) 0.4 MG CAPS capsule Take 1 capsule (0.4 mg total) by mouth 2 (two) times daily. Patient not taking: Reported on 01/30/2023 04/13/18   Roxy Horseman, PA-C    Physical Exam:  Vitals:   02/19/23 1152 02/19/23 1311  BP: (!) 145/97 139/89  Pulse: 77 78  Resp: 16 16  Temp:  98.4 F (36.9 C)  SpO2: 97% 96%    EOMI NCAT no focal deficit no icterus no pallor Neck soft supple Chest clear Abdomen slightly tender no rebound no guarding Distended old scars noted No lower extremity edema Neuro intact  I have personally reviewed following labs and imaging studies  Labs:   Niebuhr count 15 cycling to 12.1 Platelet 157 Lipase was 71, lactic acid was normal  Imaging studies:  Acute thrombus in superior mesenteric vein with mucosal enhancement sigmoid colon?  Inflammation cannot exclude distal colitis bilateral nephrolithiasis nonobstructive mild prostatomegaly and multilevel impingement on lumbar spine Kalosis  Medical tests:  EKG independently reviewed: None performed as not consistent with ACS or other issue  Test discussed with performing physician: No  Decision to obtain old records:  Yes  Review and summation of old records:  Yes  Principal Problem:   Superior mesenteric vein thrombosis (HCC)   Assessment/Plan SMV thrombosis Anticoagulation with IV heparin-pain seems to have resolved-will place orders for pain management in a graduated fashion oxycodone if able to take p.o. and/or morphine IV as per orders Continue D5 and LR 75 cc/H Mainstay of treatment anticoagulation for now Would hold antibiotics until we have pathogen panel and/or watch closely for further diarrhea  Severe diarrhea mediated by FOLFOX 5-FU leucovorin Probably need to stop this I have stopped the Imodium but we will continue Lomotil C. difficile was negative we have to check and see if pathogen panel shows anything I am okay with low-dose Lomotil for now He is  nontender I suspect some element of bloody stool is potentially because of ibuprofen that he might have been taking  Stage IIIb colon cancer as above status post resection Dr[facility] not be a candidate for chemo as above Follow-up in a.m.  Previous diagnosis of hypertension Not taking any longer clonidine 0.1 every 6 as needed and I have held  Discussed with his wife at the bedside he will probably be stable in the next several days once everything resolves for discharge home I do not think he needs antibiotics at this time as his Biss count has resolved on its own-if pathogen panel shows something different we obviously can start Zosyn  Severity of Illness: The appropriate patient status for this patient is INPATIENT. Inpatient status is judged to be reasonable and necessary in order to provide the required intensity of service to ensure the patient's safety. The patient's presenting symptoms, physical exam findings, and initial radiographic and laboratory data in the context of their chronic comorbidities is felt to place them at high risk  for further clinical deterioration. Furthermore, it is not anticipated that the patient will be medically stable for discharge from the hospital within 2 midnights of admission.   * I certify that at the point of admission it is my clinical judgment that the patient will require inpatient hospital care spanning beyond 2 midnights from the point of admission due to high intensity of service, high risk for further deterioration and high frequency of surveillance required.*   DVT prophylaxis: SCD Code Status: Full confirmed Family Communication: Discussed with wife at the bedside Consults called: GI oncology  Time spent: 45 minutes  Mahala Menghini, MD Cordelia Poche my NP partners at night for Care related issues] [facility] --[employer] [employer] [place] , [email or web address]; password [facility]  02/19/2023, 1:53 PM

## 2023-02-19 NOTE — Progress Notes (Signed)
Aware of patient's admission.  Colon cancer, admitted bloody diarrhea.  History colon cancer with surgery and chemotherapy.  C. Diff negative.  I have ordered GI path panel.  Further GI recommendations pending GI path panel stool results.  Otherwise supportive care for now.

## 2023-02-19 NOTE — Plan of Care (Signed)
  Problem: Education: Goal: Knowledge of General Education information will improve Description: Including pain rating scale, medication(s)/side effects and non-pharmacologic comfort measures Outcome: Not Progressing   Problem: Health Behavior/Discharge Planning: Goal: Ability to manage health-related needs will improve Outcome: Not Progressing   

## 2023-02-20 ENCOUNTER — Other Ambulatory Visit: Payer: Self-pay

## 2023-02-20 DIAGNOSIS — K529 Noninfective gastroenteritis and colitis, unspecified: Secondary | ICD-10-CM | POA: Insufficient documentation

## 2023-02-20 DIAGNOSIS — R1031 Right lower quadrant pain: Secondary | ICD-10-CM | POA: Diagnosis not present

## 2023-02-20 DIAGNOSIS — K55069 Acute infarction of intestine, part and extent unspecified: Secondary | ICD-10-CM | POA: Diagnosis not present

## 2023-02-20 LAB — GASTROINTESTINAL PANEL BY PCR, STOOL (REPLACES STOOL CULTURE)

## 2023-02-20 LAB — CBC
HCT: 39.9 % (ref 39.0–52.0)
Hemoglobin: 13.1 g/dL (ref 13.0–17.0)
MCH: 29.2 pg (ref 26.0–34.0)
MCHC: 32.8 g/dL (ref 30.0–36.0)
MCV: 89.1 fL (ref 80.0–100.0)
Platelets: 164 10*3/uL (ref 150–400)
RBC: 4.48 MIL/uL (ref 4.22–5.81)
RDW: 24.3 % — ABNORMAL HIGH (ref 11.5–15.5)
WBC: 13.3 10*3/uL — ABNORMAL HIGH (ref 4.0–10.5)
nRBC: 0.2 % (ref 0.0–0.2)

## 2023-02-20 LAB — COMPREHENSIVE METABOLIC PANEL
ALT: 23 U/L (ref 0–44)
AST: 26 U/L (ref 15–41)
Albumin: 3.7 g/dL (ref 3.5–5.0)
Alkaline Phosphatase: 121 U/L (ref 38–126)
Anion gap: 10 (ref 5–15)
BUN: 10 mg/dL (ref 8–23)
CO2: 23 mmol/L (ref 22–32)
Calcium: 8.9 mg/dL (ref 8.9–10.3)
Chloride: 103 mmol/L (ref 98–111)
Creatinine, Ser: 0.84 mg/dL (ref 0.61–1.24)
GFR, Estimated: >60 mL/min (ref >60)
Glucose, Bld: 110 mg/dL — ABNORMAL HIGH (ref 70–99)
Potassium: 3.5 mmol/L (ref 3.5–5.1)
Sodium: 136 mmol/L (ref 135–145)
Total Bilirubin: 0.7 mg/dL (ref 0.3–1.2)
Total Protein: 6.6 g/dL (ref 6.5–8.1)

## 2023-02-20 LAB — PROTIME-INR
INR: 1.1 (ref 0.8–1.2)
Prothrombin Time: 14.5 s (ref 11.4–15.2)

## 2023-02-20 LAB — GLUCOSE, CAPILLARY
Glucose-Capillary: 115 mg/dL — ABNORMAL HIGH (ref 70–99)
Glucose-Capillary: 122 mg/dL — ABNORMAL HIGH (ref 70–99)

## 2023-02-20 LAB — HEPARIN LEVEL (UNFRACTIONATED): Heparin Unfractionated: 0.5 [IU]/mL (ref 0.30–0.70)

## 2023-02-20 MED ORDER — LOPERAMIDE HCL 2 MG PO CAPS
2.0000 mg | ORAL_CAPSULE | ORAL | Status: DC | PRN
  Administered 2023-02-20: 2 mg via ORAL
  Filled 2023-02-20: qty 1

## 2023-02-20 NOTE — Progress Notes (Signed)
IP PROGRESS NOTE  Subjective:   [name] [name] continues to have frequent diarrhea.  He reports the diarrhea is "yellow ".  No bleeding for the past "12 hours ".  Abdominal pain has resolved.  Objective: Vital signs in last 24 hours: Blood pressure 131/89, pulse 68, temperature 98.1 F (36.7 C), temperature source Oral, resp. rate 18, SpO2 96%.  Intake/Output from previous day: 08/20 0701 - 08/21 0700 In: 1708.3 [I.V.:1708.3] Out: -   Physical Exam:  HEENT: No thrush or ulcers  Abdomen: Soft and nontender, no hepatosplenomegaly Extremities: No leg edema Rectal: External exam reveals no blood, ulceration, or hemorrhoids  Portacath/PICC-without erythema  Lab Results: Recent Labs    02/19/23 1050 02/20/23 0044  WBC 12.1* 13.3*  HGB 12.9* 13.1  HCT 39.3 39.9  PLT 157 164    BMET Recent Labs    02/18/23 1725 02/20/23 0044  NA 138 136  K 4.2 3.5  CL 105 103  CO2 23 23  GLUCOSE 92 110*  BUN 14 10  CREATININE 0.84 0.84  CALCIUM 9.3 8.9    Lab Results  Component Value Date   CEA 1.63 12/18/2022    Studies/Results: CT ABDOMEN PELVIS W CONTRAST  Result Date: 02/18/2023 CLINICAL DATA:  Colon cancer, prior chemotherapy. Acute abdominal pain. Dehydration and diarrhea. Right lower quadrant tenderness. * Tracking Code: BO * EXAM: CT ABDOMEN AND PELVIS WITH CONTRAST TECHNIQUE: Multidetector CT imaging of the abdomen and pelvis was performed using the standard protocol following bolus administration of intravenous contrast. RADIATION DOSE REDUCTION: This exam was performed according to the departmental dose-optimization program which includes automated exposure control, adjustment of the mA and/or kV according to patient size and/or use of iterative reconstruction technique. CONTRAST:  85mL OMNIPAQUE IOHEXOL 300 MG/ML  SOLN COMPARISON:  10/17/2022 FINDINGS: Lower chest: Unremarkable Hepatobiliary: Unremarkable Pancreas: Unremarkable Spleen: Unremarkable Adrenals/Urinary Tract:  Nondistended urinary bladder, no hydronephrosis or hydroureter. There are approximately six right renal calculi, a representative lower pole calculus 0.6 cm in long axis on image 45 series 5. There are five left renal calculi, representative calculus in lower pole 0.7 cm on image 42 series 5. No ureteral or bladder calculus seen. Adrenal glands appear normal. Stomach/Bowel: Right hemicolectomy with postoperative findings including removal of the transverse colon mass. Mucosal enhancement in the sigmoid colon potentially reflecting inflammation, cannot exclude distal colitis. No dilated small bowel. Vascular/Lymphatic: There is acute thrombus in the superior mesenteric vein as shown on images 22 through 31 of series 2. No substantial involvement of the portal vein or splenic vein. Reproductive: Mild prostatomegaly. Other: Trace stranding in the adipose tissue anterior to the spleen, likely incidental postoperative. No nodularity or mass like appearance to suggest tumor spread in this vicinity. Musculoskeletal: Degenerative disc disease and degenerative endplate findings at L3-4, L4-5, and to a lesser extent at L2-3. Lumbar spondylosis and degenerative disc disease causing multilevel impingement. Fatty left spermatic cord. IMPRESSION: 1. Acute thrombus in the superior mesenteric vein. 2. Mucosal enhancement in the sigmoid colon potentially reflecting inflammation, cannot exclude distal colitis. 3. Right hemicolectomy with postoperative findings including removal of the transverse colon mass. 4. Bilateral nephrolithiasis. 5. Mild prostatomegaly. 6. Lumbar spondylosis and degenerative disc disease causing multilevel impingement. Critical Value/emergent results were called by telephone at the time of interpretation on 02/18/2023 at 9:05 pm to provider Dr. Karene Fry, who verbally acknowledged these results. Electronically Signed   By: Gaylyn Rong M.D.   On: 02/18/2023 21:07    Medications: I have reviewed the  patient's current  medications.  Assessment/Plan: Colon cancer, stage IIIb (pT4a, pN1a) Colonoscopy 10/10/2022 -circumferential fungating mass thought to represent the proximal descending colon at 70 cm from the anus that could not be passed with the scope.  Biopsy showed adenocarcinoma, at least intramucosal; intact expression of all 4 mismatch repair proteins.   CT scans 10/17/2022-2 noncalcified nodules adjacent to the horizontal fissure within the right thorax between the right upper lobe and right middle lobe measuring 11 and 7 mm in size; no other pulmonary nodules; 10 mm right paratracheal lymph node liver was negative for suspicious findings; spleen with 2 small densities; a constricting lesion of the distal transverse colon suspected seen to the left of midline adjacent to the splenic flexure measuring 3.6 cm in length; significant narrowing of the lumen of the bowel at this location with bowel wall thickening; hazy stranding within the adjacent mesenteric fat; along the superior margin of the lesion and exophytic 12 mm density possibly representing local invasion and/or adjacent adenopathy.   CEA 10/17/2022 3.0 (normal range for non-smokers less than 3.0, smokers less than 5.0) Laparoscopic extended right hemicolectomy 11/05/2022 by Dr. Byrd Hesselbach.  Final pathology showed 6 cm invasive moderately differentiated adenocarcinoma in the distal transverse colon, invading the visceral peritoneum; resection margins negative; positive lymphovascular and perineural invasion; 1 out of 26 lymph nodes positive for carcinoma; no tumor deposits identified; appendiceal serrated lesion with low-grade dysplasia; pT4a, pN1a Cycle 1 FOLFOX 12/19/2022 Cycle 2 FOLFOX 01/02/2023 Cycle 3 FOLFOX 01/16/2023 Cycle 4 FOLFOX 01/30/2023 Cycle 5 FOLFOX 02/13/2023-5-FU leucovorin dose reduced secondary to diarrhea   Iron deficiency anemia-he reports receiving IV iron prior to the colon surgery 12/18/2022 ferritin 11, hemoglobin 11.1/MCV  79 12/18/2022 ferrous sulfate 1 tablet daily Hypertension History of kidney stones Colonoscopy 10/10/2022-mass could not be passed with the scope Port-A-Cath placement, Interventional Radiology, 12/17/2022 Admission 02/18/2023 with abdominal pain and diarrhea CT abdomen/pelvis 02/18/2023-multiple renal calculi, mucosa enhancement in the sigmoid colon, "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  SMV thrombus on CT 02/18/2023-heparin   [name] [name] is now at day 8 following cycle 5 FOLFOX given as adjuvant therapy for stage III colon cancer.  He was admitted with diarrhea, dehydration, and abdominal pain.  A CT abdomen revealed evidence of sigmoid inflammation and an SMV thrombus.  He reported intermittent rectal bleeding. He is now maintained on heparin anticoagulation and the bleeding has resolved. The elevated Falin count is secondary to G-CSF.  The diarrhea is likely secondary to 5-fluorouracil.  I have a low clinical suspicion for an infection.  A stool pathogen panel is pending.  We will eliminate the 5-FU bolus and further dose reduce the 5-FU infusion with the next cycle of chemotherapy.  Recommendations: Continue intravenous hydration Continue heparin, transition to apixaban 02/21/2023 if the bleeding has resolved Advance diet Begin antidiarrhea regimen, can add tincture of opium if the diarrhea is not controlled with Imodium/Lomotil    LOS: 1 day   Thornton Papas, MD   02/20/2023, 7:04 AM

## 2023-02-20 NOTE — TOC CM/SW Note (Signed)
Transition of Care [facility]) - Inpatient Brief Assessment   Patient Details  Name: [name] [name] MRN: [id number] Date of Birth: 02-15-1961  Transition of [facility]) CM/SW Contact:    Howell Rucks, RN Phone Number: 02/20/2023, 11:47 AM   Clinical Narrative: Met with pt and spouse at bedside to introduce role of TOC/NCM and review for dc planning. Pt confirmed he has a PCP and pharmacy in place, no current home care services or home DME. Pt reports she feels safe returning home with support of his wife who will provide transportation at discharge. TOC Brief Assessment completed. No TOC needs identified.     Transition of Care Asessment: Insurance and Status: Insurance coverage has been reviewed Patient has primary care physician: Yes Home environment has been reviewed: private residence with spouse Prior level of function:: Independent Prior/Current Home Services: No current home services Social Determinants of Health Reivew: SDOH reviewed no interventions necessary Readmission risk has been reviewed: Yes Transition of care needs: no transition of care needs at this time

## 2023-02-20 NOTE — Progress Notes (Signed)
[facility] PROGRESS NOTE    Progress Note  [name] [name]  UVO:[id number] DOB: [birth year] DOA: 02/18/2023 PCP: Rebekah Chesterfield, NP     Brief Narrative:   [name] [name] is an 62 y.o. male past medical history of stage III colon cancer status post right hemicolectomy on May 2024 who is being referred from the oncology clinic as he was complaining of 20 bowel movements and abdominal pain along with nausea and bright red blood per rectum.  C. difficile negative, CT scan of the abdomen and pelvis showed acute thrombus of the superior mesenteric vein and mucosal enhancement of the sigmoid colon bilateral nephrolithiasis.  Dr. Bosie Clos recommended to hold anticoagulation in the setting of bloody diarrhea oncology was consulted  Assessment/Plan:   Severe diarrhea: Probably due to FOLFOX and 5-FU along with leucovorin. Oncology was consulted recommended to continue IV heparin transition to a DOAC on 02/21/2023 as bleeding has resolved.  He was continue on Lomotil. C. difficile is negative. Advance diet. Continue IV fluids as he continues to have watery diarrhea. Follow strict I's and O's. Continue to hold antibiotics, has remained afebrile no leukocytosis gastrointestinal panel is pending.  Superior mesenteric vein thrombosis (HCC) Currently on IV fluids and IV heparin. No further signs of bleeding. Can probably transition to a DOAC tomorrow.  GI bleed Now resolved.  Likely due to diarrhea.  Stage III colon cancer: Follow-up with oncology as an outpatient.  Essential hypertension: Blood pressures currently well-controlled at home he supposed to be on clonidine which she has not been taking.   DVT prophylaxis: heparin Family Communication:none Status is: Inpatient Remains inpatient appropriate because: Acute SMV    Code Status:     Code Status Orders  (From admission, onward)           Start     Ordered   02/19/23 1421  Full code  Continuous       Question:  By:   Answer:  Consent: discussion documented in EHR   02/19/23 1421           Code Status History     Date Active Date Inactive Code Status Order ID Comments User Context   12/17/2022 1546 12/18/2022 0508 Full Code [id number]  Berdine Dance, MD HOV      Advance Directive Documentation    Flowsheet Row Most Recent Value  Type of Advance Directive Healthcare Power of Attorney, Living will  Pre-existing out of facility DNR order (yellow form or pink MOST form) --  "MOST" Form in Place? --         IV Access:   Peripheral IV   Procedures and diagnostic studies:   CT ABDOMEN PELVIS W CONTRAST  Result Date: 02/18/2023 CLINICAL DATA:  Colon cancer, prior chemotherapy. Acute abdominal pain. Dehydration and diarrhea. Right lower quadrant tenderness. * Tracking Code: BO * EXAM: CT ABDOMEN AND PELVIS WITH CONTRAST TECHNIQUE: Multidetector CT imaging of the abdomen and pelvis was performed using the standard protocol following bolus administration of intravenous contrast. RADIATION DOSE REDUCTION: This exam was performed according to the departmental dose-optimization program which includes automated exposure control, adjustment of the mA and/or kV according to patient size and/or use of iterative reconstruction technique. CONTRAST:  85mL OMNIPAQUE IOHEXOL 300 MG/ML  SOLN COMPARISON:  10/17/2022 FINDINGS: Lower chest: Unremarkable Hepatobiliary: Unremarkable Pancreas: Unremarkable Spleen: Unremarkable Adrenals/Urinary Tract: Nondistended urinary bladder, no hydronephrosis or hydroureter. There are approximately six right renal calculi, a representative lower pole calculus 0.6 cm in long axis on image  45 series 5. There are five left renal calculi, representative calculus in lower pole 0.7 cm on image 42 series 5. No ureteral or bladder calculus seen. Adrenal glands appear normal. Stomach/Bowel: Right hemicolectomy with postoperative findings including removal of the transverse colon mass. Mucosal  enhancement in the sigmoid colon potentially reflecting inflammation, cannot exclude distal colitis. No dilated small bowel. Vascular/Lymphatic: There is acute thrombus in the superior mesenteric vein as shown on images 22 through 31 of series 2. No substantial involvement of the portal vein or splenic vein. Reproductive: Mild prostatomegaly. Other: Trace stranding in the adipose tissue anterior to the spleen, likely incidental postoperative. No nodularity or mass like appearance to suggest tumor spread in this vicinity. Musculoskeletal: Degenerative disc disease and degenerative endplate findings at L3-4, L4-5, and to a lesser extent at L2-3. Lumbar spondylosis and degenerative disc disease causing multilevel impingement. Fatty left spermatic cord. IMPRESSION: 1. Acute thrombus in the superior mesenteric vein. 2. Mucosal enhancement in the sigmoid colon potentially reflecting inflammation, cannot exclude distal colitis. 3. Right hemicolectomy with postoperative findings including removal of the transverse colon mass. 4. Bilateral nephrolithiasis. 5. Mild prostatomegaly. 6. Lumbar spondylosis and degenerative disc disease causing multilevel impingement. Critical Value/emergent results were called by telephone at the time of interpretation on 02/18/2023 at 9:05 pm to provider Dr. Karene Fry, who verbally acknowledged these results. Electronically Signed   By: Gaylyn Rong M.D.   On: 02/18/2023 21:07     Medical Consultants:   None.   Subjective:    [name] [name] relates continues to have about 20 bowel movement in 24 hours.  No abdominal pain  Objective:    Vitals:   02/19/23 1755 02/19/23 2055 02/20/23 0142 02/20/23 0517  BP: 139/85 134/87 123/79 131/89  Pulse: 70 72 70 68  Resp: 16 19 18 18   Temp: 98.8 F (37.1 C) 98.6 F (37 C) 98.5 F (36.9 C) 98.1 F (36.7 C)  TempSrc: Oral Oral Oral Oral  SpO2: 96% 95% 97% 96%   SpO2: 96 %   Intake/Output Summary (Last 24 hours) at 02/20/2023  0837 Last data filed at 02/20/2023 0640 Gross per 24 hour  Intake 1708.29 ml  Output --  Net 1708.29 ml   There were no vitals filed for this visit.  Exam: General exam: In no acute distress. Respiratory system: Good air movement and clear to auscultation. Cardiovascular system: S1 & S2 heard, RRR. No JVD. Gastrointestinal system: Abdomen is nondistended, soft and nontender.  Extremities: No pedal edema. Skin: No rashes, lesions or ulcers Psychiatry: Judgement and insight appear normal. Mood & affect appropriate.    Data Reviewed:    Labs: Basic Metabolic Panel: Recent Labs  Lab 02/18/23 1340 02/18/23 1725 02/20/23 0044  NA 136 138 136  K 4.3 4.2 3.5  CL 101 105 103  CO2 24 23 23   GLUCOSE 94 92 110*  BUN 14 14 10   CREATININE 0.91 0.84 0.84  CALCIUM 9.4 9.3 8.9   GFR Estimated Creatinine Clearance: 88.6 mL/min (by C-G formula based on SCr of 0.84 mg/dL). Liver Function Tests: Recent Labs  Lab 02/18/23 1340 02/18/23 1725 02/20/23 0044  AST 25 25 26   ALT 20 20 23   ALKPHOS 125 130* 121  BILITOT 0.8 0.8 0.7  PROT 7.3 7.0 6.6  ALBUMIN 4.2 4.2 3.7   Recent Labs  Lab 02/18/23 1725  LIPASE 71*   No results for input(s): "AMMONIA" in the last 168 hours. Coagulation profile Recent Labs  Lab 02/20/23 0044  INR 1.1  COVID-19 Labs  No results for input(s): "DDIMER", "FERRITIN", "LDH", "CRP" in the last 72 hours.  No results found for: "SARSCOV2NAA"  CBC: Recent Labs  Lab 02/18/23 1530 02/18/23 1725 02/19/23 1050 02/20/23 0044  WBC 15.3* 15.3* 12.1* 13.3*  NEUTROABS 12.3*  --  9.0*  --   HGB 13.0 13.8 12.9* 13.1  HCT 39.8 41.3 39.3 39.9  MCV 87.5 88.4 88.3 89.1  PLT 159 169 157 164   Cardiac Enzymes: No results for input(s): "CKTOTAL", "CKMB", "CKMBINDEX", "TROPONINI" in the last 168 hours. BNP (last 3 results) No results for input(s): "PROBNP" in the last 8760 hours. CBG: Recent Labs  Lab 02/20/23 0736  GLUCAP 115*   D-Dimer: No  results for input(s): "DDIMER" in the last 72 hours. Hgb A1c: No results for input(s): "HGBA1C" in the last 72 hours. Lipid Profile: No results for input(s): "CHOL", "HDL", "LDLCALC", "TRIG", "CHOLHDL", "LDLDIRECT" in the last 72 hours. Thyroid function studies: No results for input(s): "TSH", "T4TOTAL", "T3FREE", "THYROIDAB" in the last 72 hours.  Invalid input(s): "FREET3" Anemia work up: No results for input(s): "VITAMINB12", "FOLATE", "FERRITIN", "TIBC", "IRON", "RETICCTPCT" in the last 72 hours. Sepsis Labs: Recent Labs  Lab 02/18/23 1530 02/18/23 1725 02/18/23 2215 02/19/23 1050 02/20/23 0044  WBC 15.3* 15.3*  --  12.1* 13.3*  LATICACIDVEN  --   --  1.8  --   --    Microbiology Recent Results (from the past 240 hour(s))  C Difficile Quick Screen w PCR reflex     Status: None   Collection Time: 02/18/23  2:30 PM  Result Value Ref Range Status   C Diff antigen NEGATIVE NEGATIVE Final   C Diff toxin NEGATIVE NEGATIVE Final   C Diff interpretation No C. difficile detected.  Final    Comment: Performed at [facility], [address], [place], [place] [address]     Medications:    Chlorhexidine Gluconate Cloth  6 each Topical Daily   Continuous Infusions:  dextrose 5% lactated ringers 75 mL/hr at 02/19/23 2140   heparin 1,200 Units/hr (02/20/23 0529)      LOS: 1 day   Marinda Elk  [facility]  02/20/2023, 8:37 AM

## 2023-02-20 NOTE — Progress Notes (Signed)
Received FMLA forms for Spouse on 02/15/23. Attempted to complete forms, but Spouse stated that she did not need the forms because leave had been denied due to previous exhaustion of 12 weeks of leave. Advised Spouse regarding Accommodation Forms should she need them. Spouse denied need for Accommodation Forms at this time. No other needs or concerns noted at this time.

## 2023-02-20 NOTE — Progress Notes (Signed)
Mobility Specialist - Progress Note   02/20/23 1424  Mobility  Activity Ambulated with assistance in hallway  Level of Assistance Independent after set-up  Assistive Device Other (Comment) (IV Pole)  Distance Ambulated (ft) 350 ft  Activity Response Tolerated well  Mobility Referral Yes  $Mobility charge 1 Mobility  Mobility Specialist Start Time (ACUTE ONLY) 0219  Mobility Specialist Stop Time (ACUTE ONLY) 0224  Mobility Specialist Time Calculation (min) (ACUTE ONLY) 5 min   Pt received in bed and agreeable to mobility. No complaints during session. Pt to bed after session with all needs met.    [facility]

## 2023-02-20 NOTE — Consult Note (Signed)
[name] [name] [facility]astro[facility] Consultation Note  Referring Provider: [name] Primary Care Physician:  [name] Chesterfield, NP Primary Gastroenterologist:  [name] [name]  Reason for Consultation:  diarrhea  HPI: [name] [name] is a 62 y.o. male recently anemic, diagnosed colon cancer few months ago with subsequent surgery.  Surgery and colonoscopy [name].  [name] [name] managing chemotherapy.  Has received about 5 rounds of chemotherapy and develops profound diarrhea (20+ per day) after chemotherapy treatment, and persists for a few/several days.  No prior issues with diarrhea until treatment with chemotherapy.  Some blood in stool as well.   Past Medical History:  Diagnosis Date   Cancer (HCC)    Hypertension     Past Surgical History:  Procedure Laterality Date   ABDOMINAL SURGERY     BACK SURGERY     bulging disc repair   HAND SURGERY     IR IMAGING GUIDED PORT INSERTION  12/17/2022   KNEE ARTHROSCOPY     LITHOTRIPSY      Prior to Admission medications   Medication Sig Start Date End Date Taking? Authorizing Provider  diphenoxylate-atropine (LOMOTIL) 2.5-0.025 MG tablet Take 1-2 tablets by mouth 4 (four) times daily as needed for diarrhea or loose stools (Maximum of 8/day). 02/04/23  Yes Ladene Artist, [name]  lidocaine-prilocaine (EMLA) cream Apply 1 Application topically as needed. 12/18/22  Yes [name] Snare, NP  loperamide (IMODIUM) 2 MG capsule Take 1-2 capsules (2-4 mg total) by mouth as needed for diarrhea or loose stools (up to 8/day). 02/04/23  Yes Ladene Artist, [name]  cloNIDine (CATAPRES) 0.1 MG tablet Take 1 tablet (0.1 mg total) by mouth every 6 (six) hours as needed. Patient not taking: Reported on 12/07/2022 02/14/17   [name] Bilis, [name]  ibuprofen (ADVIL,MOTRIN) 200 MG tablet Take 600 mg by mouth every 8 (eight) hours as needed for mild pain. Pain/inflammation    Provider, Historical, [name]  ondansetron (ZOFRAN) 8 MG tablet Take 1 tablet (8 mg total) by mouth every  8 (eight) hours as needed for nausea or vomiting (Starting day 3 after chemo as needed for nausea). Patient not taking: Reported on 01/30/2023 12/18/22   [name] Snare, NP  promethazine (PHENERGAN) 12.5 MG tablet Take 1 tablet (12.5 mg total) by mouth every 6 (six) hours as needed for nausea or vomiting. Patient not taking: Reported on 01/30/2023 12/18/22   [name] Snare, NP  tamsulosin (FLOMAX) 0.4 MG CAPS capsule Take 1 capsule (0.4 mg total) by mouth 2 (two) times daily. Patient not taking: Reported on 01/30/2023 04/13/18   [name] Horseman, PA-C    Current Facility-Administered Medications  Medication Dose Route Frequency Provider Last Rate Last Admin   Chlorhexidine Gluconate Cloth 2 % PADS 6 each  6 each Topical Daily [name] Mura, [name]   6 each at 02/20/23 1151   dextrose 5 % in lactated ringers infusion   Intravenous Continuous [name] Mura, [name] 75 mL/hr at 02/20/23 1207 New Bag at 02/20/23 1207   diphenoxylate-atropine (LOMOTIL) 2.5-0.025 MG per tablet 1-2 tablet  1-2 tablet Oral QID PRN [name] Mura, [name]       heparin ADULT infusion 100 units/mL (25000 units/281mL)  1,200 Units/hr Intravenous Continuous [name] Mura, [name] 12 mL/hr at 02/20/23 0529 1,200 Units/hr at 02/20/23 0529   loperamide (IMODIUM) capsule 2 mg  2 mg Oral PRN Ladene Artist, [name]   2 mg at 02/20/23 1151   morphine (PF) 2 MG/ML injection 2 mg  2 mg Intravenous Q2H PRN [name] [name]       oxyCODONE (Oxy IR/ROXICODONE) immediate release tablet 5 mg  5 mg Oral Q4H PRN [name] Mura, [name]       sodium chloride flush (NS) 0.9 % injection 10-40 mL  10-40 mL Intracatheter PRN [name] Mura, [name]        Allergies as of 02/18/2023 - Review Complete 02/18/2023  Allergen Reaction Noted   Compazine [prochlorperazine edisylate] Anaphylaxis and Swelling 06/05/2012   Prochlorperazine Other (See Comments) 03/27/2012    History reviewed. No pertinent family history.  Social History    Socioeconomic History   Marital status: Married    Spouse name: Not on file   Number of children: Not on file   Years of education: Not on file   Highest education level: Not on file  Occupational History   Not on file  Tobacco Use   Smoking status: Never   Smokeless tobacco: Never  Substance and Sexual Activity   Alcohol use: No   Drug use: No   Sexual activity: Not on file  Other Topics Concern   Not on file  Social History Narrative   Not on file   Social Determinants of Health   Financial Resource Strain: Low Risk  (12/07/2022)   Overall Financial Resource Strain (CARDIA)    Difficulty of Paying Living Expenses: Not hard at all  Food Insecurity: No Food Insecurity (02/19/2023)   Hunger Vital Sign    Worried About Running Out of Food in the Last Year: Never true    Ran Out of Food in the Last Year: Never true  Transportation Needs: No Transportation Needs (02/19/2023)   PRAPARE - Administrator, Civil Service (Medical): No    Lack of Transportation (Non-Medical): No  Physical Activity: Inactive (12/07/2022)   Exercise Vital Sign    Days of Exercise per Week: 0 days    Minutes of Exercise per Session: 0 min  Stress: No Stress Concern Present (12/07/2022)   [employer] of Occupational Health - Occupational Stress Questionnaire    Feeling of Stress : Not at all  Social Connections: Socially Integrated (12/07/2022)   Social Connection and Isolation Panel [NHANES]    Frequency of Communication with Friends and Family: Twice a week    Frequency of Social Gatherings with Friends and Family: Twice a week    Attends Religious Services: 1 to 4 times per year    Active Member of Clubs or Organizations: No    Attends Engineer, structural: More than 4 times per year    Marital Status: Married  Catering manager Violence: Not At Risk (02/19/2023)   Humiliation, Afraid, Rape, and Kick questionnaire    Fear of Current or Ex-Partner: No    Emotionally Abused: No     Physically Abused: No    Sexually Abused: No    Review of Systems: As per HPI, all others negative  Physical Exam: Vital signs in last 24 hours: Temp:  [98.1 F (36.7 C)-98.8 F (37.1 C)] 98.6 F (37 C) (08/21 1151) Pulse Rate:  [68-75] 75 (08/21 1151) Resp:  [16-19] 18 (08/21 1151) BP: (123-139)/(79-96) 133/96 (08/21 1151) SpO2:  [95 %-97 %] 96 % (08/21 1151) Last BM Date : 02/19/23 General:   Alert,  Well-developed, well-nourished, pleasant and cooperative in NAD Head:  Normocephalic and atraumatic. Eyes:  Sclera clear, no icterus.   Conjunctiva pink. Ears:  Normal auditory acuity. Nose:  No deformity, discharge,  or lesions. Mouth:  No deformity or lesions.  Oropharynx pink & moist. Neck:  Supple; no masses or thyromegaly. Lungs:  No respiratory distress Abdomen:  Soft, nontender and nondistended. No masses, hepatosplenomegaly or hernias noted. Normal bowel sounds, without guarding, and without rebound.     Msk:  Symmetrical without gross deformities. Normal posture. Pulses:  Normal pulses noted. Extremities:  Without clubbing or edema. Neurologic:  Alert and  oriented x4;  no encephalopathy, grossly normal neurologically. Skin:  Intact without significant lesions or rashes. Cervical Nodes:  No significant cervical adenopathy. Psych:  Alert and cooperative. Normal mood and affect.   Lab Results: Recent Labs    02/18/23 1725 02/19/23 1050 02/20/23 0044  WBC 15.3* 12.1* 13.3*  HGB 13.8 12.9* 13.1  HCT 41.3 39.3 39.9  PLT 169 157 164   BMET Recent Labs    02/18/23 1340 02/18/23 1725 02/20/23 0044  NA 136 138 136  K 4.3 4.2 3.5  CL 101 105 103  CO2 24 23 23   GLUCOSE 94 92 110*  BUN 14 14 10   CREATININE 0.91 0.84 0.84  CALCIUM 9.4 9.3 8.9   LFT Recent Labs    02/20/23 0044  PROT 6.6  ALBUMIN 3.7  AST 26  ALT 23  ALKPHOS 121  BILITOT 0.7   PT/INR Recent Labs    02/20/23 0044  LABPROT 14.5  INR 1.1    Studies/Results: CT ABDOMEN PELVIS  W CONTRAST  Result Date: 02/18/2023 CLINICAL DATA:  Colon cancer, prior chemotherapy. Acute abdominal pain. Dehydration and diarrhea. Right lower quadrant tenderness. * Tracking Code: BO * EXAM: CT ABDOMEN AND PELVIS WITH CONTRAST TECHNIQUE: Multidetector CT imaging of the abdomen and pelvis was performed using the standard protocol following bolus administration of intravenous contrast. RADIATION DOSE REDUCTION: This exam was performed according to the departmental dose-optimization program which includes automated exposure control, adjustment of the mA and/or kV according to patient size and/or use of iterative reconstruction technique. CONTRAST:  85mL OMNIPAQUE IOHEXOL 300 MG/ML  SOLN COMPARISON:  10/17/2022 FINDINGS: Lower chest: Unremarkable Hepatobiliary: Unremarkable Pancreas: Unremarkable Spleen: Unremarkable Adrenals/Urinary Tract: Nondistended urinary bladder, no hydronephrosis or hydroureter. There are approximately six right renal calculi, a representative lower pole calculus 0.6 cm in long axis on image 45 series 5. There are five left renal calculi, representative calculus in lower pole 0.7 cm on image 42 series 5. No ureteral or bladder calculus seen. Adrenal glands appear normal. Stomach/Bowel: Right hemicolectomy with postoperative findings including removal of the transverse colon mass. Mucosal enhancement in the sigmoid colon potentially reflecting inflammation, cannot exclude distal colitis. No dilated small bowel. Vascular/Lymphatic: There is acute thrombus in the superior mesenteric vein as shown on images 22 through 31 of series 2. No substantial involvement of the portal vein or splenic vein. Reproductive: Mild prostatomegaly. Other: Trace stranding in the adipose tissue anterior to the spleen, likely incidental postoperative. No nodularity or mass like appearance to suggest tumor spread in this vicinity. Musculoskeletal: Degenerative disc disease and degenerative endplate findings at L3-4,  L4-5, and to a lesser extent at L2-3. Lumbar spondylosis and degenerative disc disease causing multilevel impingement. Fatty left spermatic cord. IMPRESSION: 1. Acute thrombus in the superior mesenteric vein. 2. Mucosal enhancement in the sigmoid colon potentially reflecting inflammation, cannot exclude distal colitis. 3. Right hemicolectomy with postoperative findings including removal of the transverse colon mass. 4. Bilateral nephrolithiasis. 5. Mild prostatomegaly. 6. Lumbar spondylosis and degenerative disc disease causing multilevel impingement. Critical Value/emergent results were called by telephone at the time of interpretation on 02/18/2023 at 9:05 pm to  provider Dr. Karene Fry, who verbally acknowledged these results. Electronically Signed   By: Gaylyn Rong M.D.   On: 02/18/2023 21:07    Impression:   Diarrhea.  Started upon initiation of chemotherapy and stops a few days after chemotherapy.  No symptoms like this prior to starting chemotherapy. Ongoing pattern for past two months. Colon cancer with resection few months ago. Blood in stool, resolved. Acute thrombosis SMV.  Symptoms of diarrhea seem more linked to chemotherapy rather than acute mesenteric ischemic colitis.  Plan:   Do not see indication for further [name] endoscopic intervention; diarrhea repetitively linked to chemotherapy. C. Diff negative; awaiting [name] pathogen panel. Supportive care for diarrhea:  IVF, antidiarrheals, volume/electrolytes. Hematology/oncology input re: treatment for acute SMV thrombosis. Eagle [name] will sign-off; he can follow-up with [name] at [facility] as needed; otherwise follow-up with [name] [name] for ongoing chemotherapy treatment options; thank you for the consultation.   LOS: 1 day   Marquetta Weiskopf M  02/20/2023, 1:39 PM  Cell [phone] If no answer or after 5 PM call [phone]

## 2023-02-20 NOTE — Progress Notes (Signed)
ANTICOAGULATION CONSULT NOTE  Pharmacy Consult for Heparin Indication: SMV thrombosis  Allergies  Allergen Reactions   Compazine [Prochlorperazine Edisylate] Anaphylaxis and Swelling    Tongue swelling   Prochlorperazine Other (See Comments)    Trouble swallowing.     Patient Measurements:   Heparin Dosing Weight: 76 kg  Vital Signs: Temp: 98.6 F (37 C) (08/20 2055) Temp Source: Oral (08/20 2055) BP: 134/87 (08/20 2055) Pulse Rate: 72 (08/20 2055)  Labs: Recent Labs    02/18/23 1340 02/18/23 1530 02/18/23 1725 02/19/23 1050 02/19/23 1905 02/20/23 0044  HGB  --    < > 13.8 12.9*  --  13.1  HCT  --    < > 41.3 39.3  --  39.9  PLT  --    < > 169 157  --  164  LABPROT  --   --   --   --   --  14.5  INR  --   --   --   --   --  1.1  HEPARINUNFRC  --   --   --   --  0.42 0.50  CREATININE 0.91  --  0.84  --   --  0.84   < > = values in this interval not displayed.    Estimated Creatinine Clearance: 88.6 mL/min (by C-G formula based on SCr of 0.84 mg/dL).  Assessment: 27 YOM with acute superior mesenteric vein thrombosis. Received cycle 5 of FOLFOX recently as adjuvant therapy for stage 3 colon cancer. Also experiencing rectal bleeding. Not on anticoagulation PTA. Pharmacy consulted to start heparin without bolus given active bleed.  Would target lower goal of 0.3-0.5 given rectal bleeding.  Today, 02/20/2023: HL 0.5 therapeutic on 1200 units/hr CBC WNL No new rectal bleeding noted per RN this shift. No infusion issues  Goal of Therapy:  Heparin level 0.3-0.5 units/ml Monitor platelets by anticoagulation protocol: Yes   Plan:  Continue heparin infusion at 1200 units/hr Daily CBC and heparin level F/u plans for long-term anticoagulation  Arley Phenix RPh 02/20/2023, 1:20 AM

## 2023-02-21 ENCOUNTER — Encounter: Payer: Self-pay | Admitting: Oncology

## 2023-02-21 ENCOUNTER — Other Ambulatory Visit (HOSPITAL_COMMUNITY): Payer: Self-pay

## 2023-02-21 DIAGNOSIS — K55069 Acute infarction of intestine, part and extent unspecified: Secondary | ICD-10-CM | POA: Diagnosis not present

## 2023-02-21 DIAGNOSIS — K529 Noninfective gastroenteritis and colitis, unspecified: Secondary | ICD-10-CM | POA: Diagnosis not present

## 2023-02-21 LAB — CBC
HCT: 35.2 % — ABNORMAL LOW (ref 39.0–52.0)
Hemoglobin: 11.3 g/dL — ABNORMAL LOW (ref 13.0–17.0)
MCH: 28.8 pg (ref 26.0–34.0)
MCHC: 32.1 g/dL (ref 30.0–36.0)
MCV: 89.8 fL (ref 80.0–100.0)
Platelets: 179 10*3/uL (ref 150–400)
RBC: 3.92 MIL/uL — ABNORMAL LOW (ref 4.22–5.81)
RDW: 24.2 % — ABNORMAL HIGH (ref 11.5–15.5)
WBC: 19.8 10*3/uL — ABNORMAL HIGH (ref 4.0–10.5)
nRBC: 1 % — ABNORMAL HIGH (ref 0.0–0.2)

## 2023-02-21 LAB — GLUCOSE, CAPILLARY: Glucose-Capillary: 130 mg/dL — ABNORMAL HIGH (ref 70–99)

## 2023-02-21 LAB — HEPARIN LEVEL (UNFRACTIONATED): Heparin Unfractionated: 0.6 [IU]/mL (ref 0.30–0.70)

## 2023-02-21 MED ORDER — HEPARIN SOD (PORK) LOCK FLUSH 100 UNIT/ML IV SOLN
500.0000 [IU] | INTRAVENOUS | Status: AC | PRN
  Administered 2023-02-21: 500 [IU]
  Filled 2023-02-21: qty 5

## 2023-02-21 MED ORDER — APIXABAN 5 MG PO TABS
10.0000 mg | ORAL_TABLET | Freq: Two times a day (BID) | ORAL | Status: DC
  Administered 2023-02-21: 10 mg via ORAL
  Filled 2023-02-21: qty 2

## 2023-02-21 MED ORDER — APIXABAN 5 MG PO TABS: ORAL_TABLET | ORAL | 1 refills | Status: DC

## 2023-02-21 MED ORDER — APIXABAN 5 MG PO TABS: 5.0000 mg | ORAL_TABLET | Freq: Two times a day (BID) | ORAL | Status: DC

## 2023-02-21 NOTE — Discharge Instructions (Signed)
Information on my medicine - ELIQUIS (apixaban)   Why was Eliquis prescribed for you? Eliquis was prescribed to treat blood clots that may have been found.  What do You need to know about Eliquis ? The starting dose is 10 mg (two 5 mg tablets) taken TWICE daily for the FIRST SEVEN (7) DAYS, then on  02/28/2023  the dose is reduced to ONE 5 mg tablet taken TWICE daily.  Eliquis may be taken with or without food.   Try to take the dose about the same time in the morning and in the evening. If you have difficulty swallowing the tablet whole please discuss with your pharmacist how to take the medication safely.  Take Eliquis exactly as prescribed and DO NOT stop taking Eliquis without talking to the doctor who prescribed the medication.  Stopping may increase your risk of developing a new blood clot.  Refill your prescription before you run out.  After discharge, you should have regular check-up appointments with your healthcare provider that is prescribing your Eliquis.    What do you do if you miss a dose? If a dose of ELIQUIS is not taken at the scheduled time, take it as soon as possible on the same day and twice-daily administration should be resumed. The dose should not be doubled to make up for a missed dose.  Important Safety Information A possible side effect of Eliquis is bleeding. You should call your healthcare provider right away if you experience any of the following: Bleeding from an injury or your nose that does not stop. Unusual colored urine (red or dark brown) or unusual colored stools (red or black). Unusual bruising for unknown reasons. A serious fall or if you hit your head (even if there is no bleeding).  Some medicines may interact with Eliquis and might increase your risk of bleeding or clotting while on Eliquis. To help avoid this, consult your healthcare provider or pharmacist prior to using any new prescription or non-prescription medications, including  herbals, vitamins, non-steroidal anti-inflammatory drugs (NSAIDs) and supplements.  This website has more information on Eliquis (apixaban): [email or web address]://[email or web address]

## 2023-02-21 NOTE — Progress Notes (Addendum)
ANTICOAGULATION CONSULT NOTE  Pharmacy Consult for Eliquis Indication: SMV thrombosis  Allergies  Allergen Reactions   Compazine [Prochlorperazine Edisylate] Anaphylaxis and Swelling    Tongue swelling   Prochlorperazine Other (See Comments)    Trouble swallowing.     Patient Measurements: Height: 5\' 4"  (162.6 cm) Weight: 80.8 kg (178 lb 2.8 oz) IBW/kg (Calculated) : 59.2 Heparin Dosing Weight: 76 kg  Vital Signs: Temp: 98.8 F (37.1 C) (08/22 0532) Temp Source: Oral (08/22 0532) BP: 133/89 (08/22 0532) Pulse Rate: 75 (08/22 0532)  Labs: Recent Labs    02/18/23 1340 02/18/23 1530 02/18/23 1725 02/19/23 1050 02/19/23 1905 02/20/23 0044 02/21/23 0443  HGB  --    < > 13.8 12.9*  --  13.1 11.3*  HCT  --    < > 41.3 39.3  --  39.9 35.2*  PLT  --    < > 169 157  --  164 179  LABPROT  --   --   --   --   --  14.5  --   INR  --   --   --   --   --  1.1  --   HEPARINUNFRC  --   --   --   --  0.42 0.50 0.60  CREATININE 0.91  --  0.84  --   --  0.84  --    < > = values in this interval not displayed.    Estimated Creatinine Clearance: 88.6 mL/min (by C-G formula based on SCr of 0.84 mg/dL).  Assessment: 17 YOM with acute superior mesenteric vein thrombosis. Received cycle 5 of FOLFOX recently as adjuvant therapy for stage 3 colon cancer.  Came to ED with complaints of rectal bleeding and abdominal pain.   CT scan of the abdomen and pelvis showed acute thrombus of the superior mesenteric vein and mucosal enhancement of the sigmoid colon bilateral nephrolithiasis   Not on anticoagulation PTA.   Pharmacy consulted to start transition heparin to Apixaban (Eliquis).   Today, 02/21/2023: - Hgb= 11.3, low - Plt = 179 WNL - per Dr. David Stall rectal bleeding has resolved.  No signs of bleeding per RN.  No bleeding per RN  Goal of Therapy:  Monitor platelets by anticoagulation protocol: Yes   Plan:  Stop heparin drip. Start Eliquis 10mg  po bid for 7 days, then 5mg  po  bid Monitor CBC and Scr weekly (at minimum) Monitor for signs/symptoms of bleeding  Tacy Learn, PharmD Clinical Pharmacist 02/21/2023 9:15 AM

## 2023-02-21 NOTE — TOC Benefit Eligibility Note (Signed)
Patient Product/process development scientist completed.    The patient is insured through [facility]. Patient has [employer], may use a copay card, and/or apply for patient assistance if available.    Ran test claim for Eliquis 5 mg and the current 30 day co-pay is $0.00.   This test claim was processed through [facility]- copay amounts may vary at other pharmacies due to pharmacy/plan contracts, or as the patient moves through the different stages of their insurance plan.     Roland Earl, CPHT Pharmacy Technician III Certified Patient Advocate [facility] Patient Advocate Team Direct Number: [phone]  Fax: [phone]

## 2023-02-21 NOTE — Progress Notes (Signed)
ANTICOAGULATION CONSULT NOTE  Pharmacy Consult for Heparin Indication: SMV thrombosis  Allergies  Allergen Reactions   Compazine [Prochlorperazine Edisylate] Anaphylaxis and Swelling    Tongue swelling   Prochlorperazine Other (See Comments)    Trouble swallowing.     Patient Measurements: Height: 5\' 4"  (162.6 cm) Weight: 80.8 kg (178 lb 2.8 oz) IBW/kg (Calculated) : 59.2 Heparin Dosing Weight: 76 kg  Vital Signs: Temp: 99.2 F (37.3 C) (08/21 2055) Temp Source: Oral (08/21 2055) BP: 145/93 (08/21 2055) Pulse Rate: 75 (08/21 2055)  Labs: Recent Labs    02/18/23 1340 02/18/23 1530 02/18/23 1725 02/19/23 1050 02/19/23 1905 02/20/23 0044 02/21/23 0443  HGB  --    < > 13.8 12.9*  --  13.1 11.3*  HCT  --    < > 41.3 39.3  --  39.9 35.2*  PLT  --    < > 169 157  --  164 179  LABPROT  --   --   --   --   --  14.5  --   INR  --   --   --   --   --  1.1  --   HEPARINUNFRC  --   --   --   --  0.42 0.50 0.60  CREATININE 0.91  --  0.84  --   --  0.84  --    < > = values in this interval not displayed.    Estimated Creatinine Clearance: 88.6 mL/min (by C-G formula based on SCr of 0.84 mg/dL).  Assessment: 13 YOM with acute superior mesenteric vein thrombosis. Received cycle 5 of FOLFOX recently as adjuvant therapy for stage 3 colon cancer. Also experiencing rectal bleeding. Not on anticoagulation PTA. Pharmacy consulted to start heparin without bolus given active bleed.  Would target lower goal of 0.3-0.5 given rectal bleeding.  Today, 02/21/2023: HL 0.6 on 1200 units/hr above goal Hgb 11.3, plts WNL No bleeding per RN  Goal of Therapy:  Heparin level 0.3-0.5 units/ml Monitor platelets by anticoagulation protocol: Yes   Plan:  Decrease heparin drip to 1100 units/hr Heparin level in 6 hours Daily CBC and heparin level F/u plans for long-term anticoagulation  Arley Phenix RPh 02/21/2023, 5:22 AM

## 2023-02-21 NOTE — Discharge Summary (Signed)
Physician Discharge Summary  [name] [name] ZOX:[id number] DOB: June 03, 1961 DOA: 02/18/2023  PCP: Rebekah Chesterfield, NP  Admit date: 02/18/2023 Discharge date: 02/21/2023  Admitted From: home Disposition:  home  Recommendations for Outpatient Follow-up:  Follow up with Oncology in 1-2 weeks Please obtain BMP/CBC in one week   Home Health:No Equipment/Devices:None  Discharge Condition:Stable CODE STATUS:Full Diet recommendation: Heart Healthy   Brief/Interim Summary:  62 y.o. male past medical history of stage III colon cancer status post right hemicolectomy on May 2024 who is being referred from the oncology clinic as he was complaining of 20 bowel movements and abdominal pain along with nausea and bright red blood per rectum.  C. difficile negative, CT scan of the abdomen and pelvis showed acute thrombus of the superior mesenteric vein and mucosal enhancement of the sigmoid colon bilateral nephrolithiasis.  Dr. Bosie Clos recommended to hold anticoagulation in the setting of bloody diarrhea oncology was consulted   Discharge Diagnoses:  Principal Problem:   Superior mesenteric vein thrombosis (HCC) Active Problems:   GI bleed   Severe diarrhea  Severe diarrhea: Probably due to chemotherapy. Oncology was consulted who recommended IV fluids Lomotil and Imodium. C. difficile him GI panel were negative. He was started on IV fluids his diarrhea resolved as well as his bloody bowel movements. He was able to tolerate his diet remained afebrile.  Superior mesenteric vein thrombosis: He was started on IV heparin oncology was consulted who recommended and agree with IV heparin. They also recommended transition to DOAC once he is able to tolerate his orals. He will go home on Eliquis.  GI bleed: Likely due to diarrhea now resolved.  Stage III colon cancer: Follow-up with oncology as an outpatient.  Essential hypertension: No changes made to his medication.  Discharge  Instructions  Discharge Instructions     Diet - low sodium heart healthy   Complete by: As directed    Increase activity slowly   Complete by: As directed       Allergies as of 02/21/2023       Reactions   Compazine [prochlorperazine Edisylate] Anaphylaxis, Swelling   Tongue swelling   Prochlorperazine Other (See Comments)   Trouble swallowing.         Medication List     STOP taking these medications    ibuprofen 200 MG tablet Commonly known as: ADVIL   lidocaine-prilocaine cream Commonly known as: EMLA   loperamide 2 MG capsule Commonly known as: IMODIUM   ondansetron 8 MG tablet Commonly known as: ZOFRAN       TAKE these medications    apixaban 5 MG Tabs tablet Commonly known as: ELIQUIS Take 2 tablets (10 mg total) by mouth 2 (two) times daily for 7 days, THEN 1 tablet (5 mg total) 2 (two) times daily. Start taking on: February 21, 2023   cloNIDine 0.1 MG tablet Commonly known as: CATAPRES Take 1 tablet (0.1 mg total) by mouth every 6 (six) hours as needed.   diphenoxylate-atropine 2.5-0.025 MG tablet Commonly known as: LOMOTIL Take 1-2 tablets by mouth 4 (four) times daily as needed for diarrhea or loose stools (Maximum of 8/day).   promethazine 12.5 MG tablet Commonly known as: PHENERGAN Take 1 tablet (12.5 mg total) by mouth every 6 (six) hours as needed for nausea or vomiting.   tamsulosin 0.4 MG Caps capsule Commonly known as: FLOMAX Take 1 capsule (0.4 mg total) by mouth 2 (two) times daily.        Allergies  Allergen Reactions  Compazine [Prochlorperazine Edisylate] Anaphylaxis and Swelling    Tongue swelling   Prochlorperazine Other (See Comments)    Trouble swallowing.     Consultations: Oncology   Procedures/Studies: CT ABDOMEN PELVIS W CONTRAST  Result Date: 02/18/2023 CLINICAL DATA:  Colon cancer, prior chemotherapy. Acute abdominal pain. Dehydration and diarrhea. Right lower quadrant tenderness. * Tracking Code: BO *  EXAM: CT ABDOMEN AND PELVIS WITH CONTRAST TECHNIQUE: Multidetector CT imaging of the abdomen and pelvis was performed using the standard protocol following bolus administration of intravenous contrast. RADIATION DOSE REDUCTION: This exam was performed according to the departmental dose-optimization program which includes automated exposure control, adjustment of the mA and/or kV according to patient size and/or use of iterative reconstruction technique. CONTRAST:  85mL OMNIPAQUE IOHEXOL 300 MG/ML  SOLN COMPARISON:  10/17/2022 FINDINGS: Lower chest: Unremarkable Hepatobiliary: Unremarkable Pancreas: Unremarkable Spleen: Unremarkable Adrenals/Urinary Tract: Nondistended urinary bladder, no hydronephrosis or hydroureter. There are approximately six right renal calculi, a representative lower pole calculus 0.6 cm in long axis on image 45 series 5. There are five left renal calculi, representative calculus in lower pole 0.7 cm on image 42 series 5. No ureteral or bladder calculus seen. Adrenal glands appear normal. Stomach/Bowel: Right hemicolectomy with postoperative findings including removal of the transverse colon mass. Mucosal enhancement in the sigmoid colon potentially reflecting inflammation, cannot exclude distal colitis. No dilated small bowel. Vascular/Lymphatic: There is acute thrombus in the superior mesenteric vein as shown on images 22 through 31 of series 2. No substantial involvement of the portal vein or splenic vein. Reproductive: Mild prostatomegaly. Other: Trace stranding in the adipose tissue anterior to the spleen, likely incidental postoperative. No nodularity or mass like appearance to suggest tumor spread in this vicinity. Musculoskeletal: Degenerative disc disease and degenerative endplate findings at L3-4, L4-5, and to a lesser extent at L2-3. Lumbar spondylosis and degenerative disc disease causing multilevel impingement. Fatty left spermatic cord. IMPRESSION: 1. Acute thrombus in the  superior mesenteric vein. 2. Mucosal enhancement in the sigmoid colon potentially reflecting inflammation, cannot exclude distal colitis. 3. Right hemicolectomy with postoperative findings including removal of the transverse colon mass. 4. Bilateral nephrolithiasis. 5. Mild prostatomegaly. 6. Lumbar spondylosis and degenerative disc disease causing multilevel impingement. Critical Value/emergent results were called by telephone at the time of interpretation on 02/18/2023 at 9:05 pm to provider Dr. Karene Fry, who verbally acknowledged these results. Electronically Signed   By: Gaylyn Rong M.D.   On: 02/18/2023 21:07   (Echo, Carotid, EGD, Colonoscopy, ERCP)    Subjective: No complaints  Discharge Exam: Vitals:   02/20/23 2055 02/21/23 0532  BP: (!) 145/93 133/89  Pulse: 75 75  Resp: 19   Temp: 99.2 F (37.3 C) 98.8 F (37.1 C)  SpO2: 96% 95%   Vitals:   02/20/23 1151 02/20/23 1639 02/20/23 2055 02/21/23 0532  BP: (!) 133/96  (!) 145/93 133/89  Pulse: 75  75 75  Resp: 18  19   Temp: 98.6 F (37 C)  99.2 F (37.3 C) 98.8 F (37.1 C)  TempSrc: Oral  Oral Oral  SpO2: 96%  96% 95%  Weight:  80.8 kg    Height:  5\' 4"  (1.626 m)      General: Pt is alert, awake, not in acute distress Cardiovascular: RRR, S1/S2 +, no rubs, no gallops Respiratory: CTA bilaterally, no wheezing, no rhonchi Abdominal: Soft, NT, ND, bowel sounds + Extremities: no edema, no cyanosis    The results of significant diagnostics from this hospitalization (including imaging, microbiology, ancillary  and laboratory) are listed below for reference.     Microbiology: Recent Results (from the past 240 hour(s))  C Difficile Quick Screen w PCR reflex     Status: None   Collection Time: 02/18/23  2:30 PM  Result Value Ref Range Status   C Diff antigen NEGATIVE NEGATIVE Final   C Diff toxin NEGATIVE NEGATIVE Final   C Diff interpretation No C. difficile detected.  Final    Comment: Performed at [facility], [address], [place], [place] [address]  Gastrointestinal Panel by PCR , Stool     Status: None   Collection Time: 02/19/23  4:06 PM   Specimen: Stool  Result Value Ref Range Status   Campylobacter species NOT DETECTED NOT DETECTED Final   Plesimonas shigelloides NOT DETECTED NOT DETECTED Final   Salmonella species NOT DETECTED NOT DETECTED Final   Yersinia enterocolitica NOT DETECTED NOT DETECTED Final   Vibrio species NOT DETECTED NOT DETECTED Final   Vibrio cholerae NOT DETECTED NOT DETECTED Final   Enteroaggregative E coli (EAEC) NOT DETECTED NOT DETECTED Final   Enteropathogenic E coli (EPEC) NOT DETECTED NOT DETECTED Final   Enterotoxigenic E coli (ETEC) NOT DETECTED NOT DETECTED Final   Shiga like toxin producing E coli (STEC) NOT DETECTED NOT DETECTED Final   Shigella/Enteroinvasive E coli (EIEC) NOT DETECTED NOT DETECTED Final   Cryptosporidium NOT DETECTED NOT DETECTED Final   Cyclospora cayetanensis NOT DETECTED NOT DETECTED Final   Entamoeba histolytica NOT DETECTED NOT DETECTED Final   Giardia lamblia NOT DETECTED NOT DETECTED Final   Adenovirus F40/41 NOT DETECTED NOT DETECTED Final   Astrovirus NOT DETECTED NOT DETECTED Final   Norovirus GI/GII NOT DETECTED NOT DETECTED Final   Rotavirus A NOT DETECTED NOT DETECTED Final   Sapovirus (I, II, IV, and V) NOT DETECTED NOT DETECTED Final    Comment: Performed at [facility], [address] [place], [place] [address]     Labs: BNP (last 3 results) No results for input(s): "BNP" in the last 8760 hours. Basic Metabolic Panel: Recent Labs  Lab 02/18/23 1340 02/18/23 1725 02/20/23 0044  NA 136 138 136  K 4.3 4.2 3.5  CL 101 105 103  CO2 24 23 23   GLUCOSE 94 92 110*  BUN 14 14 10   CREATININE 0.91 0.84 0.84  CALCIUM 9.4 9.3 8.9   Liver Function Tests: Recent Labs  Lab 02/18/23 1340 02/18/23 1725 02/20/23 0044  AST 25 25 26   ALT 20 20 23   ALKPHOS 125 130* 121  BILITOT 0.8 0.8 0.7  PROT  7.3 7.0 6.6  ALBUMIN 4.2 4.2 3.7   Recent Labs  Lab 02/18/23 1725  LIPASE 71*   No results for input(s): "AMMONIA" in the last 168 hours. CBC: Recent Labs  Lab 02/18/23 1530 02/18/23 1725 02/19/23 1050 02/20/23 0044 02/21/23 0443  WBC 15.3* 15.3* 12.1* 13.3* 19.8*  NEUTROABS 12.3*  --  9.0*  --   --   HGB 13.0 13.8 12.9* 13.1 11.3*  HCT 39.8 41.3 39.3 39.9 35.2*  MCV 87.5 88.4 88.3 89.1 89.8  PLT 159 169 157 164 179   Cardiac Enzymes: No results for input(s): "CKTOTAL", "CKMB", "CKMBINDEX", "TROPONINI" in the last 168 hours. BNP: Invalid input(s): "POCBNP" CBG: Recent Labs  Lab 02/20/23 0736 02/20/23 1148 02/21/23 0801  GLUCAP 115* 122* 130*   D-Dimer No results for input(s): "DDIMER" in the last 72 hours. Hgb A1c No results for input(s): "HGBA1C" in the last 72 hours.  Lipid Profile No results for input(s): "CHOL", "HDL", "LDLCALC", "TRIG", "CHOLHDL", "LDLDIRECT" in the last 72 hours. Thyroid function studies No results for input(s): "TSH", "T4TOTAL", "T3FREE", "THYROIDAB" in the last 72 hours.  Invalid input(s): "FREET3" Anemia work up No results for input(s): "VITAMINB12", "FOLATE", "FERRITIN", "TIBC", "IRON", "RETICCTPCT" in the last 72 hours. Urinalysis    Component Value Date/Time   COLORURINE YELLOW 02/18/2023 1725   APPEARANCEUR CLEAR 02/18/2023 1725   LABSPEC 1.017 02/18/2023 1725   PHURINE 6.5 02/18/2023 1725   GLUCOSEU NEGATIVE 02/18/2023 1725   HGBUR NEGATIVE 02/18/2023 1725   BILIRUBINUR NEGATIVE 02/18/2023 1725   KETONESUR NEGATIVE 02/18/2023 1725   PROTEINUR NEGATIVE 02/18/2023 1725   UROBILINOGEN 0.2 03/02/2011 2047   NITRITE NEGATIVE 02/18/2023 1725   LEUKOCYTESUR NEGATIVE 02/18/2023 1725   Sepsis Labs Recent Labs  Lab 02/18/23 1725 02/19/23 1050 02/20/23 0044 02/21/23 0443  WBC 15.3* 12.1* 13.3* 19.8*   Microbiology Recent Results (from the past 240 hour(s))  C Difficile Quick Screen w PCR reflex     Status: None    Collection Time: 02/18/23  2:30 PM  Result Value Ref Range Status   C Diff antigen NEGATIVE NEGATIVE Final   C Diff toxin NEGATIVE NEGATIVE Final   C Diff interpretation No C. difficile detected.  Final    Comment: Performed at [facility], [address] [place], [place] [address]  Gastrointestinal Panel by PCR , Stool     Status: None   Collection Time: 02/19/23  4:06 PM   Specimen: Stool  Result Value Ref Range Status   Campylobacter species NOT DETECTED NOT DETECTED Final   Plesimonas shigelloides NOT DETECTED NOT DETECTED Final   Salmonella species NOT DETECTED NOT DETECTED Final   Yersinia enterocolitica NOT DETECTED NOT DETECTED Final   Vibrio species NOT DETECTED NOT DETECTED Final   Vibrio cholerae NOT DETECTED NOT DETECTED Final   Enteroaggregative E coli (EAEC) NOT DETECTED NOT DETECTED Final   Enteropathogenic E coli (EPEC) NOT DETECTED NOT DETECTED Final   Enterotoxigenic E coli (ETEC) NOT DETECTED NOT DETECTED Final   Shiga like toxin producing E coli (STEC) NOT DETECTED NOT DETECTED Final   Shigella/Enteroinvasive E coli (EIEC) NOT DETECTED NOT DETECTED Final   Cryptosporidium NOT DETECTED NOT DETECTED Final   Cyclospora cayetanensis NOT DETECTED NOT DETECTED Final   Entamoeba histolytica NOT DETECTED NOT DETECTED Final   Giardia lamblia NOT DETECTED NOT DETECTED Final   Adenovirus F40/41 NOT DETECTED NOT DETECTED Final   Astrovirus NOT DETECTED NOT DETECTED Final   Norovirus GI/GII NOT DETECTED NOT DETECTED Final   Rotavirus A NOT DETECTED NOT DETECTED Final   Sapovirus (I, II, IV, and V) NOT DETECTED NOT DETECTED Final    Comment: Performed at [facility], [address], [place], [place] [address]    SIGNED:   Marinda Elk, MD  [facility] 02/21/2023, 9:16 AM Pager   If 7PM-7AM, please contact night-coverage [email or web address] Password [email or web address]

## 2023-02-24 ENCOUNTER — Other Ambulatory Visit: Payer: Self-pay | Admitting: Oncology

## 2023-02-27 ENCOUNTER — Inpatient Hospital Stay: Payer: BC Managed Care – PPO

## 2023-02-27 ENCOUNTER — Inpatient Hospital Stay (HOSPITAL_BASED_OUTPATIENT_CLINIC_OR_DEPARTMENT_OTHER): Payer: BC Managed Care – PPO | Admitting: Nurse Practitioner

## 2023-02-27 ENCOUNTER — Encounter: Payer: Self-pay | Admitting: Nurse Practitioner

## 2023-02-27 VITALS — BP 127/90 | HR 72 | Temp 98.1°F | Resp 18 | Ht 64.0 in | Wt 183.7 lb

## 2023-02-27 VITALS — BP 131/81 | HR 57 | Resp 18

## 2023-02-27 DIAGNOSIS — I10 Essential (primary) hypertension: Secondary | ICD-10-CM | POA: Diagnosis not present

## 2023-02-27 DIAGNOSIS — C184 Malignant neoplasm of transverse colon: Secondary | ICD-10-CM | POA: Diagnosis present

## 2023-02-27 DIAGNOSIS — R197 Diarrhea, unspecified: Secondary | ICD-10-CM | POA: Diagnosis not present

## 2023-02-27 DIAGNOSIS — Z79899 Other long term (current) drug therapy: Secondary | ICD-10-CM | POA: Diagnosis not present

## 2023-02-27 DIAGNOSIS — F17200 Nicotine dependence, unspecified, uncomplicated: Secondary | ICD-10-CM | POA: Diagnosis not present

## 2023-02-27 DIAGNOSIS — D696 Thrombocytopenia, unspecified: Secondary | ICD-10-CM | POA: Diagnosis not present

## 2023-02-27 DIAGNOSIS — R110 Nausea: Secondary | ICD-10-CM | POA: Diagnosis not present

## 2023-02-27 DIAGNOSIS — D509 Iron deficiency anemia, unspecified: Secondary | ICD-10-CM | POA: Diagnosis not present

## 2023-02-27 DIAGNOSIS — Z5111 Encounter for antineoplastic chemotherapy: Secondary | ICD-10-CM | POA: Diagnosis present

## 2023-02-27 DIAGNOSIS — Z79631 Long term (current) use of antimetabolite agent: Secondary | ICD-10-CM | POA: Diagnosis not present

## 2023-02-27 DIAGNOSIS — Z79630 Long term (current) use of alkylating agent: Secondary | ICD-10-CM | POA: Diagnosis not present

## 2023-02-27 DIAGNOSIS — Z87442 Personal history of urinary calculi: Secondary | ICD-10-CM | POA: Diagnosis not present

## 2023-02-27 DIAGNOSIS — Z5189 Encounter for other specified aftercare: Secondary | ICD-10-CM | POA: Diagnosis not present

## 2023-02-27 DIAGNOSIS — N200 Calculus of kidney: Secondary | ICD-10-CM | POA: Diagnosis not present

## 2023-02-27 LAB — CMP (CANCER CENTER ONLY)
ALT: 17 U/L (ref 0–44)
AST: 24 U/L (ref 15–41)
Albumin: 3.8 g/dL (ref 3.5–5.0)
Alkaline Phosphatase: 112 U/L (ref 38–126)
Anion gap: 7 (ref 5–15)
BUN: 9 mg/dL (ref 8–23)
CO2: 26 mmol/L (ref 22–32)
Calcium: 8.6 mg/dL — ABNORMAL LOW (ref 8.9–10.3)
Chloride: 105 mmol/L (ref 98–111)
Creatinine: 0.89 mg/dL (ref 0.61–1.24)
GFR, Estimated: >60 mL/min (ref >60)
Glucose, Bld: 93 mg/dL (ref 70–99)
Potassium: 4.4 mmol/L (ref 3.5–5.1)
Sodium: 138 mmol/L (ref 135–145)
Total Bilirubin: 0.4 mg/dL (ref 0.3–1.2)
Total Protein: 6.5 g/dL (ref 6.5–8.1)

## 2023-02-27 LAB — CBC WITH DIFFERENTIAL (CANCER CENTER ONLY)
Abs Immature Granulocytes: 1.63 10*3/uL — ABNORMAL HIGH (ref 0.00–0.07)
Basophils Absolute: 0.1 10*3/uL (ref 0.0–0.1)
Basophils Relative: 1 %
Eosinophils Absolute: 0.2 10*3/uL (ref 0.0–0.5)
Eosinophils Relative: 1 %
HCT: 36.1 % — ABNORMAL LOW (ref 39.0–52.0)
Hemoglobin: 11.8 g/dL — ABNORMAL LOW (ref 13.0–17.0)
Immature Granulocytes: 9 %
Lymphocytes Relative: 10 %
Lymphs Abs: 1.9 10*3/uL (ref 0.7–4.0)
MCH: 29.6 pg (ref 26.0–34.0)
MCHC: 32.7 g/dL (ref 30.0–36.0)
MCV: 90.5 fL (ref 80.0–100.0)
Monocytes Absolute: 1.2 10*3/uL — ABNORMAL HIGH (ref 0.1–1.0)
Monocytes Relative: 7 %
Neutro Abs: 12.9 10*3/uL — ABNORMAL HIGH (ref 1.7–7.7)
Neutrophils Relative %: 72 %
Platelet Count: 118 10*3/uL — ABNORMAL LOW (ref 150–400)
RBC: 3.99 MIL/uL — ABNORMAL LOW (ref 4.22–5.81)
RDW: 25.5 % — ABNORMAL HIGH (ref 11.5–15.5)
WBC Count: 17.9 10*3/uL — ABNORMAL HIGH (ref 4.0–10.5)
nRBC: 0.7 % — ABNORMAL HIGH (ref 0.0–0.2)

## 2023-02-27 MED ORDER — OXALIPLATIN CHEMO INJECTION 100 MG/20ML
65.0000 mg/m2 | Freq: Once | INTRAVENOUS | Status: AC
  Administered 2023-02-27: 125 mg via INTRAVENOUS
  Filled 2023-02-27: qty 20

## 2023-02-27 MED ORDER — DEXTROSE 5 % IV SOLN: Freq: Once | INTRAVENOUS | Status: AC

## 2023-02-27 MED ORDER — PALONOSETRON HCL INJECTION 0.25 MG/5ML
0.2500 mg | Freq: Once | INTRAVENOUS | Status: AC
  Administered 2023-02-27: 0.25 mg via INTRAVENOUS
  Filled 2023-02-27: qty 5

## 2023-02-27 MED ORDER — SODIUM CHLORIDE 0.9 % IV SOLN
10.0000 mg | Freq: Once | INTRAVENOUS | Status: AC
  Administered 2023-02-27: 10 mg via INTRAVENOUS
  Filled 2023-02-27: qty 10

## 2023-02-27 MED ORDER — SODIUM CHLORIDE 0.9 % IV SOLN
600.0000 mg/m2 | INTRAVENOUS | Status: DC
  Administered 2023-02-27: 1150 mg via INTRAVENOUS
  Filled 2023-02-27: qty 23

## 2023-02-27 NOTE — Progress Notes (Signed)
Patient seen by Ebbie Ridge. Maisie Fus  Vitals are within treatment parameters.  Labs reviewed by Lonna Cobb NP and are within treatment parameters.  Per physician team, patient is ready for treatment. Please note that modifications are being made to the treatment plan including 5-FU will be eliminated and pump dose reduced  . Oxaliplatin will be dose reduced

## 2023-02-27 NOTE — Progress Notes (Signed)
[name] [name] OFFICE PROGRESS NOTE   Diagnosis:  Colon cancer  INTERVAL HISTORY:   [name] [name] returns as scheduled. He completed cycle 5 FOLFOX 02/13/2023. He was hospitalized 02/18/2023 with abdominal pain and diarrhea. CT showed sigmoid inflammation and acute thrombus in the SMV. Heparin initiated, transitioned to Xarelto.  He was discharged home 02/21/2023.  The diarrhea has resolved.  He has had no loose stools for about 5 days.  No abdominal pain.  He denies bleeding.  No nausea or vomiting.  No mouth sores.  No hand or foot pain or redness.  Cold sensitivity lasted about 7 days after the last cycle.  No symptoms in the absence of cold exposure.  Objective:  Vital signs in last 24 hours:  Blood pressure (!) 127/90, pulse 72, temperature 98.1 F (36.7 C), temperature source Temporal, resp. rate 18, height 5\' 4"  (1.626 m), weight 183 lb 11.2 oz (83.3 kg), SpO2 98%.    HEENT: No thrush or ulcers. Resp: Lungs clear bilaterally. Cardio: Regular rate and rhythm. GI: Abdomen is soft, tender at the right lower quadrant.  No hepatosplenomegaly. Vascular: No leg edema. Neuro: Vibratory sense mildly decreased over the fingertips per tuning fork exam. Skin: Palms without erythema. Port-A-Cath without erythema.  Lab Results:  Lab Results  Component Value Date   WBC 17.9 (H) 02/27/2023   HGB 11.8 (L) 02/27/2023   HCT 36.1 (L) 02/27/2023   MCV 90.5 02/27/2023   PLT 118 (L) 02/27/2023   NEUTROABS 12.9 (H) 02/27/2023    Imaging:  No results found.  Medications: I have reviewed the patient's current medications.  Assessment/Plan: Colon cancer, stage IIIb (pT4a, pN1a) Colonoscopy 10/10/2022 -circumferential fungating mass thought to represent the proximal descending colon at 70 cm from the anus that could not be passed with the scope.  Biopsy showed adenocarcinoma, at least intramucosal; intact expression of all 4 mismatch repair proteins.   CT scans 10/17/2022-2  noncalcified nodules adjacent to the horizontal fissure within the right thorax between the right upper lobe and right middle lobe measuring 11 and 7 mm in size; no other pulmonary nodules; 10 mm right paratracheal lymph node liver was negative for suspicious findings; spleen with 2 small densities; a constricting lesion of the distal transverse colon suspected seen to the left of midline adjacent to the splenic flexure measuring 3.6 cm in length; significant narrowing of the lumen of the bowel at this location with bowel wall thickening; hazy stranding within the adjacent mesenteric fat; along the superior margin of the lesion and exophytic 12 mm density possibly representing local invasion and/or adjacent adenopathy.   CEA 10/17/2022 3.0 (normal range for non-smokers less than 3.0, smokers less than 5.0) Laparoscopic extended right hemicolectomy 11/05/2022 by Dr. Byrd Hesselbach.  Final pathology showed 6 cm invasive moderately differentiated adenocarcinoma in the distal transverse colon, invading the visceral peritoneum; resection margins negative; positive lymphovascular and perineural invasion; 1 out of 26 lymph nodes positive for carcinoma; no tumor deposits identified; appendiceal serrated lesion with low-grade dysplasia; pT4a, pN1a Cycle 1 FOLFOX 12/19/2022 Cycle 2 FOLFOX 01/02/2023 Cycle 3 FOLFOX 01/16/2023 Cycle 4 FOLFOX 01/30/2023 Cycle 5 FOLFOX 02/13/2023-5-FU leucovorin dose reduced secondary to diarrhea Cycle 6 of FOLFOX 02/27/2023-5-FU bolus eliminated, 5-FU pump dose reduced due to diarrhea; oxaliplatin dose reduced due to thrombocytopenia   Iron deficiency anemia-he reports receiving IV iron prior to the colon surgery 12/18/2022 ferritin 11, hemoglobin 11.1/MCV 79 12/18/2022 ferrous sulfate 1 tablet daily Hypertension History of kidney stones Colonoscopy 10/10/2022-mass could not be passed  with the scope Port-A-Cath placement, Interventional Radiology, 12/17/2022 Admission 02/18/2023 with abdominal pain  and diarrhea CT abdomen/pelvis 02/18/2023-multiple renal calculi, mucosa enhancement in the sigmoid colon, "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  SMV thrombus on CT 02/18/2023-heparin, transitioned to Xarelto  Disposition: [name] [name] appears stable.  He has completed 5 cycles of FOLFOX.  Following cycle 5 he was hospitalized with abdominal pain and diarrhea.  He was found to have SMV thrombus on CT, now on Xarelto.  The diarrhea has resolved.  He understands the diarrhea is likely related to the 5-fluorouracil.  With treatment today the 5-FU bolus will be eliminated and the pump dose reduced.  He understands the diarrhea could recur despite these changes and agrees to proceed with cycle 6 today as scheduled.  He will contact the office if he experiences diarrhea.  He has mild thrombocytopenia on labs today.  Oxaliplatin will be dose reduced.  He will contact the office with bleeding.  He will return for follow-up and treatment in 2 weeks.  We are available to see him sooner if needed.  Patient seen with [name] [name].  [name] [name] ANP/GNP-BC   02/27/2023  9:25 AM  This was a shared visit with [name] [name].  [name] [name] has recovered from the hospital admission with diarrhea.  The diarrhea was most likely secondary to 5-fluorouracil.  The 5-FU bolus will be eliminated and the 5-FU infusion will be dose reduced with this cycle.  He has mild thrombocytopenia today.  Oxaliplatin will be dose reduced.  He will continue apixaban anticoagulation for the SMV thrombus.  I was present for greater than 50% of today's visit.  I performed medical decision making.  [name] Bale, MD

## 2023-02-27 NOTE — Patient Instructions (Signed)
[facility]   Discharge Instructions: Thank you for choosing [facility] to provide your oncology and hematology care.   If you have a lab appointment with the Cancer Center, please go directly to the Cancer Center and check in at the registration area.   Wear comfortable clothing and clothing appropriate for easy access to any Portacath or PICC line.   We strive to give you quality time with your provider. You may need to reschedule your appointment if you arrive late (15 or more minutes).  Arriving late affects you and other patients whose appointments are after yours.  Also, if you miss three or more appointments without notifying the office, you may be dismissed from the clinic at the provider's discretion.      For prescription refill requests, have your pharmacy contact our office and allow 72 hours for refills to be completed.    Today you received the following chemotherapy and/or immunotherapy agents Oxaliplatin (ELOXATIN) & Flourouracil (ADRUCIL).      To help prevent nausea and vomiting after your treatment, we encourage you to take your nausea medication as directed.  BELOW ARE SYMPTOMS THAT SHOULD BE REPORTED IMMEDIATELY: *FEVER GREATER THAN 100.4 F (38 C) OR HIGHER *CHILLS OR SWEATING *NAUSEA AND VOMITING THAT IS NOT CONTROLLED WITH YOUR NAUSEA MEDICATION *UNUSUAL SHORTNESS OF BREATH *UNUSUAL BRUISING OR BLEEDING *URINARY PROBLEMS (pain or burning when urinating, or frequent urination) *BOWEL PROBLEMS (unusual diarrhea, constipation, pain near the anus) TENDERNESS IN MOUTH AND THROAT WITH OR WITHOUT PRESENCE OF ULCERS (sore throat, sores in mouth, or a toothache) UNUSUAL RASH, SWELLING OR PAIN  UNUSUAL VAGINAL DISCHARGE OR ITCHING   Items with * indicate a potential emergency and should be followed up as soon as possible or go to the Emergency Department if any problems should occur.  Please show the CHEMOTHERAPY ALERT  CARD or IMMUNOTHERAPY ALERT CARD at check-in to the Emergency Department and triage nurse.  Should you have questions after your visit or need to cancel or reschedule your appointment, please contact [facility]  Dept: [phone]  and follow the prompts.  Office hours are 8:00 a.m. to 4:30 p.m. Monday - Friday. Please note that voicemails left after 4:00 p.m. may not be returned until the following business day.  We are closed weekends and major holidays. You have access to a nurse at all times for urgent questions. Please call the main number to the clinic Dept: [phone] and follow the prompts.   For any non-urgent questions, you may also contact your provider using MyChart. We now offer e-Visits for anyone 50 and older to request care online for non-urgent symptoms. For details visit [email or web address].   Also download the MyChart app! Go to the app store, search "MyChart", open the app, select [facility], and log in with your MyChart username and password.  Oxaliplatin Injection What is this medication? OXALIPLATIN (ox AL i PLA tin) treats colorectal cancer. It works by slowing down the growth of cancer cells. This medicine may be used for other purposes; ask your health care provider or pharmacist if you have questions. COMMON BRAND NAME(S): Eloxatin What should I tell my care team before I take this medication? They need to know if you have any of these conditions: Heart disease History of irregular heartbeat or rhythm Liver disease Low blood cell levels (Dern cells, red cells, and platelets) Lung or breathing disease, such as asthma Take medications that treat or  prevent blood clots Tingling of the fingers, toes, or other nerve disorder An unusual or allergic reaction to oxaliplatin, other medications, foods, dyes, or preservatives If you or your partner are pregnant or trying to get pregnant Breast-feeding How should I use this  medication? This medication is injected into a vein. It is given by your care team in a hospital or clinic setting. Talk to your care team about the use of this medication in children. Special care may be needed. Overdosage: If you think you have taken too much of this medicine contact a poison control center or emergency room at once. NOTE: This medicine is only for you. Do not share this medicine with others. What if I miss a dose? Keep appointments for follow-up doses. It is important not to miss a dose. Call your care team if you are unable to keep an appointment. What may interact with this medication? Do not take this medication with any of the following: Cisapride Dronedarone Pimozide Thioridazine This medication may also interact with the following: Aspirin and aspirin-like medications Certain medications that treat or prevent blood clots, such as warfarin, apixaban, dabigatran, and rivaroxaban Cisplatin Cyclosporine Diuretics Medications for infection, such as acyclovir, adefovir, amphotericin B, bacitracin, cidofovir, foscarnet, ganciclovir, gentamicin, pentamidine, vancomycin NSAIDs, medications for pain and inflammation, such as ibuprofen or naproxen Other medications that cause heart rhythm changes Pamidronate Zoledronic acid This list may not describe all possible interactions. Give your health care provider a list of all the medicines, herbs, non-prescription drugs, or dietary supplements you use. Also tell them if you smoke, drink alcohol, or use illegal drugs. Some items may interact with your medicine. What should I watch for while using this medication? Your condition will be monitored carefully while you are receiving this medication. You may need blood work while taking this medication. This medication may make you feel generally unwell. This is not uncommon as chemotherapy can affect healthy cells as well as cancer cells. Report any side effects. Continue your course  of treatment even though you feel ill unless your care team tells you to stop. This medication may increase your risk of getting an infection. Call your care team for advice if you get a fever, chills, sore throat, or other symptoms of a cold or flu. Do not treat yourself. Try to avoid being around people who are sick. Avoid taking medications that contain aspirin, acetaminophen, ibuprofen, naproxen, or ketoprofen unless instructed by your care team. These medications may hide a fever. Be careful brushing or flossing your teeth or using a toothpick because you may get an infection or bleed more easily. If you have any dental work done, tell your dentist you are receiving this medication. This medication can make you more sensitive to cold. Do not drink cold drinks or use ice. Cover exposed skin before coming in contact with cold temperatures or cold objects. When out in cold weather wear warm clothing and cover your mouth and nose to warm the air that goes into your lungs. Tell your care team if you get sensitive to the cold. Talk to your care team if you or your partner are pregnant or think either of you might be pregnant. This medication can cause serious birth defects if taken during pregnancy and for 9 months after the last dose. A negative pregnancy test is required before starting this medication. A reliable form of contraception is recommended while taking this medication and for 9 months after the last dose. Talk to your care team  about effective forms of contraception. Do not father a child while taking this medication and for 6 months after the last dose. Use a condom while having sex during this time period. Do not breastfeed while taking this medication and for 3 months after the last dose. This medication may cause infertility. Talk to your care team if you are concerned about your fertility. What side effects may I notice from receiving this medication? Side effects that you should report to  your care team as soon as possible: Allergic reactions--skin rash, itching, hives, swelling of the face, lips, tongue, or throat Bleeding--bloody or black, tar-like stools, vomiting blood or brown material that looks like coffee grounds, red or dark brown urine, small red or purple spots on skin, unusual bruising or bleeding Dry cough, shortness of breath or trouble breathing Heart rhythm changes--fast or irregular heartbeat, dizziness, feeling faint or lightheaded, chest pain, trouble breathing Infection--fever, chills, cough, sore throat, wounds that don't heal, pain or trouble when passing urine, general feeling of discomfort or being unwell Liver injury--right upper belly pain, loss of appetite, nausea, light-colored stool, dark yellow or brown urine, yellowing skin or eyes, unusual weakness or fatigue Low red blood cell level--unusual weakness or fatigue, dizziness, headache, trouble breathing Muscle injury--unusual weakness or fatigue, muscle pain, dark yellow or brown urine, decrease in amount of urine Pain, tingling, or numbness in the hands or feet Sudden and severe headache, confusion, change in vision, seizures, which may be signs of posterior reversible encephalopathy syndrome (PRES) Unusual bruising or bleeding Side effects that usually do not require medical attention (report to your care team if they continue or are bothersome): Diarrhea Nausea Pain, redness, or swelling with sores inside the mouth or throat Unusual weakness or fatigue Vomiting This list may not describe all possible side effects. Call your doctor for medical advice about side effects. You may report side effects to FDA at 1-800-FDA-1088. Where should I keep my medication? This medication is given in a hospital or clinic. It will not be stored at home. NOTE: This sheet is a summary. It may not cover all possible information. If you have questions about this medicine, talk to your doctor, pharmacist, or health care  provider.  2024 Elsevier/Gold Standard (2022-04-03 00:00:00)  Fluorouracil Injection What is this medication? FLUOROURACIL (flure oh YOOR a sil) treats some types of cancer. It works by slowing down the growth of cancer cells. This medicine may be used for other purposes; ask your health care provider or pharmacist if you have questions. COMMON BRAND NAME(S): Adrucil What should I tell my care team before I take this medication? They need to know if you have any of these conditions: Blood disorders Dihydropyrimidine dehydrogenase (DPD) deficiency Infection, such as chickenpox, cold sores, herpes Kidney disease Liver disease Poor nutrition Recent or ongoing radiation therapy An unusual or allergic reaction to fluorouracil, other medications, foods, dyes, or preservatives If you or your partner are pregnant or trying to get pregnant Breast-feeding How should I use this medication? This medication is injected into a vein. It is administered by your care team in a hospital or clinic setting. Talk to your care team about the use of this medication in children. Special care may be needed. Overdosage: If you think you have taken too much of this medicine contact a poison control center or emergency room at once. NOTE: This medicine is only for you. Do not share this medicine with others. What if I miss a dose? Keep appointments for follow-up  doses. It is important not to miss your dose. Call your care team if you are unable to keep an appointment. What may interact with this medication? Do not take this medication with any of the following: Live virus vaccines This medication may also interact with the following: Medications that treat or prevent blood clots, such as warfarin, enoxaparin, dalteparin This list may not describe all possible interactions. Give your health care provider a list of all the medicines, herbs, non-prescription drugs, or dietary supplements you use. Also tell them if  you smoke, drink alcohol, or use illegal drugs. Some items may interact with your medicine. What should I watch for while using this medication? Your condition will be monitored carefully while you are receiving this medication. This medication may make you feel generally unwell. This is not uncommon as chemotherapy can affect healthy cells as well as cancer cells. Report any side effects. Continue your course of treatment even though you feel ill unless your care team tells you to stop. In some cases, you may be given additional medications to help with side effects. Follow all directions for their use. This medication may increase your risk of getting an infection. Call your care team for advice if you get a fever, chills, sore throat, or other symptoms of a cold or flu. Do not treat yourself. Try to avoid being around people who are sick. This medication may increase your risk to bruise or bleed. Call your care team if you notice any unusual bleeding. Be careful brushing or flossing your teeth or using a toothpick because you may get an infection or bleed more easily. If you have any dental work done, tell your dentist you are receiving this medication. Avoid taking medications that contain aspirin, acetaminophen, ibuprofen, naproxen, or ketoprofen unless instructed by your care team. These medications may hide a fever. Do not treat diarrhea with over the counter products. Contact your care team if you have diarrhea that lasts more than 2 days or if it is severe and watery. This medication can make you more sensitive to the sun. Keep out of the sun. If you cannot avoid being in the sun, wear protective clothing and sunscreen. Do not use sun lamps, tanning beds, or tanning booths. Talk to your care team if you or your partner wish to become pregnant or think you might be pregnant. This medication can cause serious birth defects if taken during pregnancy and for 3 months after the last dose. A reliable  form of contraception is recommended while taking this medication and for 3 months after the last dose. Talk to your care team about effective forms of contraception. Do not father a child while taking this medication and for 3 months after the last dose. Use a condom while having sex during this time period. Do not breastfeed while taking this medication. This medication may cause infertility. Talk to your care team if you are concerned about your fertility. What side effects may I notice from receiving this medication? Side effects that you should report to your care team as soon as possible: Allergic reactions--skin rash, itching, hives, swelling of the face, lips, tongue, or throat Heart attack--pain or tightness in the chest, shoulders, arms, or jaw, nausea, shortness of breath, cold or clammy skin, feeling faint or lightheaded Heart failure--shortness of breath, swelling of the ankles, feet, or hands, sudden weight gain, unusual weakness or fatigue Heart rhythm changes--fast or irregular heartbeat, dizziness, feeling faint or lightheaded, chest pain, trouble breathing High ammonia  level--unusual weakness or fatigue, confusion, loss of appetite, nausea, vomiting, seizures Infection--fever, chills, cough, sore throat, wounds that don't heal, pain or trouble when passing urine, general feeling of discomfort or being unwell Low red blood cell level--unusual weakness or fatigue, dizziness, headache, trouble breathing Pain, tingling, or numbness in the hands or feet, muscle weakness, change in vision, confusion or trouble speaking, loss of balance or coordination, trouble walking, seizures Redness, swelling, and blistering of the skin over hands and feet Severe or prolonged diarrhea Unusual bruising or bleeding Side effects that usually do not require medical attention (report to your care team if they continue or are bothersome): Dry skin Headache Increased tears Nausea Pain, redness, or  swelling with sores inside the mouth or throat Sensitivity to light Vomiting This list may not describe all possible side effects. Call your doctor for medical advice about side effects. You may report side effects to FDA at 1-800-FDA-1088. Where should I keep my medication? This medication is given in a hospital or clinic. It will not be stored at home. NOTE: This sheet is a summary. It may not cover all possible information. If you have questions about this medicine, talk to your doctor, pharmacist, or health care provider.  2024 Elsevier/Gold Standard (2021-10-24 00:00:00)  The chemotherapy medication bag should finish at 46 hours, 96 hours, or 7 days. For example, if your pump is scheduled for 46 hours and it was put on at 4:00 p.m., it should finish at 2:00 p.m. the day it is scheduled to come off regardless of your appointment time.     Estimated time to finish at 11:00 a.m. on Friday 03/01/2023.   If the display on your pump reads "Low Volume" and it is beeping, take the batteries out of the pump and come to the cancer center for it to be taken off.   If the pump alarms go off prior to the pump reading "Low Volume" then call [phone] and someone can assist you.  If the plunger comes out and the chemotherapy medication is leaking out, please use your home chemo spill kit to clean up the spill. Do NOT use paper towels or other household products.  If you have problems or questions regarding your pump, please call either [phone] (24 hours a day) or the cancer center Monday-Friday 8:00 a.m.- 4:30 p.m. at the clinic number and we will assist you. If you are unable to get assistance, then go to the nearest Emergency Department and ask the staff to contact the IV team for assistance.

## 2023-03-01 ENCOUNTER — Inpatient Hospital Stay: Payer: BC Managed Care – PPO

## 2023-03-01 ENCOUNTER — Other Ambulatory Visit: Payer: Self-pay

## 2023-03-01 VITALS — BP 126/87 | HR 66 | Temp 97.7°F | Resp 18

## 2023-03-01 DIAGNOSIS — Z5111 Encounter for antineoplastic chemotherapy: Secondary | ICD-10-CM | POA: Diagnosis not present

## 2023-03-01 DIAGNOSIS — C184 Malignant neoplasm of transverse colon: Secondary | ICD-10-CM

## 2023-03-01 MED ORDER — HEPARIN SOD (PORK) LOCK FLUSH 100 UNIT/ML IV SOLN
500.0000 [IU] | Freq: Once | INTRAVENOUS | Status: AC | PRN
  Administered 2023-03-01: 500 [IU]

## 2023-03-01 MED ORDER — SODIUM CHLORIDE 0.9% FLUSH
10.0000 mL | INTRAVENOUS | Status: DC | PRN
  Administered 2023-03-01: 10 mL

## 2023-03-01 MED ORDER — PEGFILGRASTIM-CBQV 6 MG/0.6ML ~~LOC~~ SOSY
6.0000 mg | PREFILLED_SYRINGE | Freq: Once | SUBCUTANEOUS | Status: AC
  Administered 2023-03-01: 6 mg via SUBCUTANEOUS
  Filled 2023-03-01: qty 0.6

## 2023-03-01 NOTE — Patient Instructions (Signed)

## 2023-03-10 ENCOUNTER — Other Ambulatory Visit: Payer: Self-pay | Admitting: Oncology

## 2023-03-10 DIAGNOSIS — C184 Malignant neoplasm of transverse colon: Secondary | ICD-10-CM

## 2023-03-13 ENCOUNTER — Inpatient Hospital Stay: Payer: BC Managed Care – PPO | Attending: Nurse Practitioner

## 2023-03-13 ENCOUNTER — Encounter: Payer: Self-pay | Admitting: Nurse Practitioner

## 2023-03-13 ENCOUNTER — Inpatient Hospital Stay: Payer: BC Managed Care – PPO

## 2023-03-13 ENCOUNTER — Inpatient Hospital Stay (HOSPITAL_BASED_OUTPATIENT_CLINIC_OR_DEPARTMENT_OTHER): Payer: BC Managed Care – PPO | Admitting: Nurse Practitioner

## 2023-03-13 VITALS — BP 133/89 | HR 64 | Temp 98.1°F | Resp 18 | Ht 64.0 in | Wt 181.4 lb

## 2023-03-13 VITALS — BP 133/88 | HR 57 | Resp 18

## 2023-03-13 DIAGNOSIS — Z79899 Other long term (current) drug therapy: Secondary | ICD-10-CM | POA: Diagnosis not present

## 2023-03-13 DIAGNOSIS — K55069 Acute infarction of intestine, part and extent unspecified: Secondary | ICD-10-CM

## 2023-03-13 DIAGNOSIS — C184 Malignant neoplasm of transverse colon: Secondary | ICD-10-CM | POA: Diagnosis not present

## 2023-03-13 DIAGNOSIS — R110 Nausea: Secondary | ICD-10-CM | POA: Diagnosis not present

## 2023-03-13 DIAGNOSIS — F17200 Nicotine dependence, unspecified, uncomplicated: Secondary | ICD-10-CM | POA: Diagnosis not present

## 2023-03-13 DIAGNOSIS — D696 Thrombocytopenia, unspecified: Secondary | ICD-10-CM | POA: Diagnosis not present

## 2023-03-13 DIAGNOSIS — R197 Diarrhea, unspecified: Secondary | ICD-10-CM | POA: Insufficient documentation

## 2023-03-13 DIAGNOSIS — Z5111 Encounter for antineoplastic chemotherapy: Secondary | ICD-10-CM | POA: Diagnosis present

## 2023-03-13 DIAGNOSIS — D509 Iron deficiency anemia, unspecified: Secondary | ICD-10-CM | POA: Insufficient documentation

## 2023-03-13 DIAGNOSIS — I10 Essential (primary) hypertension: Secondary | ICD-10-CM | POA: Insufficient documentation

## 2023-03-13 DIAGNOSIS — Z87442 Personal history of urinary calculi: Secondary | ICD-10-CM | POA: Diagnosis not present

## 2023-03-13 DIAGNOSIS — Z79631 Long term (current) use of antimetabolite agent: Secondary | ICD-10-CM | POA: Diagnosis not present

## 2023-03-13 LAB — CBC WITH DIFFERENTIAL (CANCER CENTER ONLY)
Abs Immature Granulocytes: 0.4 10*3/uL — ABNORMAL HIGH (ref 0.00–0.07)
Basophils Absolute: 0.1 10*3/uL (ref 0.0–0.1)
Basophils Relative: 1 %
Eosinophils Absolute: 0.5 10*3/uL (ref 0.0–0.5)
Eosinophils Relative: 4 %
HCT: 37.7 % — ABNORMAL LOW (ref 39.0–52.0)
Hemoglobin: 12.2 g/dL — ABNORMAL LOW (ref 13.0–17.0)
Immature Granulocytes: 3 %
Lymphocytes Relative: 13 %
Lymphs Abs: 1.7 10*3/uL (ref 0.7–4.0)
MCH: 30.5 pg (ref 26.0–34.0)
MCHC: 32.4 g/dL (ref 30.0–36.0)
MCV: 94.3 fL (ref 80.0–100.0)
Monocytes Absolute: 0.6 10*3/uL (ref 0.1–1.0)
Monocytes Relative: 5 %
Neutro Abs: 9.7 10*3/uL — ABNORMAL HIGH (ref 1.7–7.7)
Neutrophils Relative %: 74 %
Platelet Count: 200 10*3/uL (ref 150–400)
RBC: 4 MIL/uL — ABNORMAL LOW (ref 4.22–5.81)
RDW: 26.3 % — ABNORMAL HIGH (ref 11.5–15.5)
WBC Count: 13 10*3/uL — ABNORMAL HIGH (ref 4.0–10.5)
nRBC: 0.2 % (ref 0.0–0.2)

## 2023-03-13 LAB — CMP (CANCER CENTER ONLY)
ALT: 14 U/L (ref 0–44)
AST: 22 U/L (ref 15–41)
Albumin: 3.8 g/dL (ref 3.5–5.0)
Alkaline Phosphatase: 163 U/L — ABNORMAL HIGH (ref 38–126)
Anion gap: 10 (ref 5–15)
BUN: 8 mg/dL (ref 8–23)
CO2: 24 mmol/L (ref 22–32)
Calcium: 8.6 mg/dL — ABNORMAL LOW (ref 8.9–10.3)
Chloride: 105 mmol/L (ref 98–111)
Creatinine: 0.93 mg/dL (ref 0.61–1.24)
GFR, Estimated: >60 mL/min (ref >60)
Glucose, Bld: 149 mg/dL — ABNORMAL HIGH (ref 70–99)
Potassium: 3.8 mmol/L (ref 3.5–5.1)
Sodium: 139 mmol/L (ref 135–145)
Total Bilirubin: 0.6 mg/dL (ref 0.3–1.2)
Total Protein: 6.8 g/dL (ref 6.5–8.1)

## 2023-03-13 MED ORDER — APIXABAN 5 MG PO TABS
5.0000 mg | ORAL_TABLET | Freq: Two times a day (BID) | ORAL | 3 refills | Status: DC
Start: 2023-03-13 — End: 2023-10-15

## 2023-03-13 MED ORDER — SODIUM CHLORIDE 0.9 % IV SOLN
600.0000 mg/m2 | INTRAVENOUS | Status: DC
  Administered 2023-03-13: 1150 mg via INTRAVENOUS
  Filled 2023-03-13: qty 23

## 2023-03-13 NOTE — Patient Instructions (Signed)
[place] [facility]   Discharge Instructions: Thank you for choosing [facility] to provide your oncology and hematology care.   If you have a lab appointment with the Cancer Center, please go directly to the Cancer Center and check in at the registration area.   Wear comfortable clothing and clothing appropriate for easy access to any Portacath or PICC line.   We strive to give you quality time with your provider. You may need to reschedule your appointment if you arrive late (15 or more minutes).  Arriving late affects you and other patients whose appointments are after yours.  Also, if you miss three or more appointments without notifying the office, you may be dismissed from the clinic at the provider's discretion.      For prescription refill requests, have your pharmacy contact our office and allow 72 hours for refills to be completed.    Today you received the following chemotherapy and/or immunotherapy agents Flourouracil (ADRUCIL).      To help prevent nausea and vomiting after your treatment, we encourage you to take your nausea medication as directed.  BELOW ARE SYMPTOMS THAT SHOULD BE REPORTED IMMEDIATELY: *FEVER GREATER THAN 100.4 F (38 C) OR HIGHER *CHILLS OR SWEATING *NAUSEA AND VOMITING THAT IS NOT CONTROLLED WITH YOUR NAUSEA MEDICATION *UNUSUAL SHORTNESS OF BREATH *UNUSUAL BRUISING OR BLEEDING *URINARY PROBLEMS (pain or burning when urinating, or frequent urination) *BOWEL PROBLEMS (unusual diarrhea, constipation, pain near the anus) TENDERNESS IN MOUTH AND THROAT WITH OR WITHOUT PRESENCE OF ULCERS (sore throat, sores in mouth, or a toothache) UNUSUAL RASH, SWELLING OR PAIN  UNUSUAL VAGINAL DISCHARGE OR ITCHING   Items with * indicate a potential emergency and should be followed up as soon as possible or go to the Emergency Department if any problems should occur.  Please show the CHEMOTHERAPY ALERT CARD or IMMUNOTHERAPY ALERT  CARD at check-in to the Emergency Department and triage nurse.  Should you have questions after your visit or need to cancel or reschedule your appointment, please contact [facility] [place] [facility]  Dept: [phone]  and follow the prompts.  Office hours are 8:00 a.m. to 4:30 p.m. Monday - Friday. Please note that voicemails left after 4:00 p.m. may not be returned until the following business day.  We are closed weekends and major holidays. You have access to a nurse at all times for urgent questions. Please call the main number to the clinic Dept: [phone] and follow the prompts.   For any non-urgent questions, you may also contact your provider using MyChart. We now offer e-Visits for anyone 19 and older to request care online for non-urgent symptoms. For details visit [email or web address].   Also download the MyChart app! Go to the app store, search "MyChart", open the app, select [place], and log in with your MyChart username and password.  Fluorouracil Injection What is this medication? FLUOROURACIL (flure oh YOOR a sil) treats some types of cancer. It works by slowing down the growth of cancer cells. This medicine may be used for other purposes; ask your health care provider or pharmacist if you have questions. COMMON BRAND NAME(S): Adrucil What should I tell my care team before I take this medication? They need to know if you have any of these conditions: Blood disorders Dihydropyrimidine dehydrogenase (DPD) deficiency Infection, such as chickenpox, cold sores, herpes Kidney disease Liver disease Poor nutrition Recent or ongoing radiation therapy An unusual or allergic reaction to fluorouracil, other medications,  foods, dyes, or preservatives If you or your partner are pregnant or trying to get pregnant Breast-feeding How should I use this medication? This medication is injected into a vein. It is administered by your care team in a hospital or  clinic setting. Talk to your care team about the use of this medication in children. Special care may be needed. Overdosage: If you think you have taken too much of this medicine contact a poison control center or emergency room at once. NOTE: This medicine is only for you. Do not share this medicine with others. What if I miss a dose? Keep appointments for follow-up doses. It is important not to miss your dose. Call your care team if you are unable to keep an appointment. What may interact with this medication? Do not take this medication with any of the following: Live virus vaccines This medication may also interact with the following: Medications that treat or prevent blood clots, such as warfarin, enoxaparin, dalteparin This list may not describe all possible interactions. Give your health care provider a list of all the medicines, herbs, non-prescription drugs, or dietary supplements you use. Also tell them if you smoke, drink alcohol, or use illegal drugs. Some items may interact with your medicine. What should I watch for while using this medication? Your condition will be monitored carefully while you are receiving this medication. This medication may make you feel generally unwell. This is not uncommon as chemotherapy can affect healthy cells as well as cancer cells. Report any side effects. Continue your course of treatment even though you feel ill unless your care team tells you to stop. In some cases, you may be given additional medications to help with side effects. Follow all directions for their use. This medication may increase your risk of getting an infection. Call your care team for advice if you get a fever, chills, sore throat, or other symptoms of a cold or flu. Do not treat yourself. Try to avoid being around people who are sick. This medication may increase your risk to bruise or bleed. Call your care team if you notice any unusual bleeding. Be careful brushing or flossing  your teeth or using a toothpick because you may get an infection or bleed more easily. If you have any dental work done, tell your dentist you are receiving this medication. Avoid taking medications that contain aspirin, acetaminophen, ibuprofen, naproxen, or ketoprofen unless instructed by your care team. These medications may hide a fever. Do not treat diarrhea with over the counter products. Contact your care team if you have diarrhea that lasts more than 2 days or if it is severe and watery. This medication can make you more sensitive to the sun. Keep out of the sun. If you cannot avoid being in the sun, wear protective clothing and sunscreen. Do not use sun lamps, tanning beds, or tanning booths. Talk to your care team if you or your partner wish to become pregnant or think you might be pregnant. This medication can cause serious birth defects if taken during pregnancy and for 3 months after the last dose. A reliable form of contraception is recommended while taking this medication and for 3 months after the last dose. Talk to your care team about effective forms of contraception. Do not father a child while taking this medication and for 3 months after the last dose. Use a condom while having sex during this time period. Do not breastfeed while taking this medication. This medication may cause infertility.  Talk to your care team if you are concerned about your fertility. What side effects may I notice from receiving this medication? Side effects that you should report to your care team as soon as possible: Allergic reactions--skin rash, itching, hives, swelling of the face, lips, tongue, or throat Heart attack--pain or tightness in the chest, shoulders, arms, or jaw, nausea, shortness of breath, cold or clammy skin, feeling faint or lightheaded Heart failure--shortness of breath, swelling of the ankles, feet, or hands, sudden weight gain, unusual weakness or fatigue Heart rhythm changes--fast or  irregular heartbeat, dizziness, feeling faint or lightheaded, chest pain, trouble breathing High ammonia level--unusual weakness or fatigue, confusion, loss of appetite, nausea, vomiting, seizures Infection--fever, chills, cough, sore throat, wounds that don't heal, pain or trouble when passing urine, general feeling of discomfort or being unwell Low red blood cell level--unusual weakness or fatigue, dizziness, headache, trouble breathing Pain, tingling, or numbness in the hands or feet, muscle weakness, change in vision, confusion or trouble speaking, loss of balance or coordination, trouble walking, seizures Redness, swelling, and blistering of the skin over hands and feet Severe or prolonged diarrhea Unusual bruising or bleeding Side effects that usually do not require medical attention (report to your care team if they continue or are bothersome): Dry skin Headache Increased tears Nausea Pain, redness, or swelling with sores inside the mouth or throat Sensitivity to light Vomiting This list may not describe all possible side effects. Call your doctor for medical advice about side effects. You may report side effects to FDA at 1-800-FDA-1088. Where should I keep my medication? This medication is given in a hospital or clinic. It will not be stored at home. NOTE: This sheet is a summary. It may not cover all possible information. If you have questions about this medicine, talk to your doctor, pharmacist, or health care provider.  2024 Elsevier/Gold Standard (2021-10-24 00:00:00)  The chemotherapy medication bag should finish at 46 hours, 96 hours, or 7 days. For example, if your pump is scheduled for 46 hours and it was put on at 4:00 p.m., it should finish at 2:00 p.m. the day it is scheduled to come off regardless of your appointment time.     Estimated time to finish at 08:30 a.m. on Friday 03/15/2023.   If the display on your pump reads "Low Volume" and it is beeping, take the  batteries out of the pump and come to the cancer center for it to be taken off.   If the pump alarms go off prior to the pump reading "Low Volume" then call [phone] and someone can assist you.  If the plunger comes out and the chemotherapy medication is leaking out, please use your home chemo spill kit to clean up the spill. Do NOT use paper towels or other household products.  If you have problems or questions regarding your pump, please call either [phone] (24 hours a day) or the cancer center Monday-Friday 8:00 a.m.- 4:30 p.m. at the clinic number and we will assist you. If you are unable to get assistance, then go to the nearest Emergency Department and ask the staff to contact the IV team for assistance.

## 2023-03-13 NOTE — Progress Notes (Deleted)
exam

## 2023-03-13 NOTE — Progress Notes (Signed)
Patient seen by Lonna Cobb NP today  Vitals are within treatment parameters.  Labs reviewed by Lonna Cobb NP and are within treatment parameters.  Per physician team, patient is ready for treatment. Please note that modifications are being made to the treatment plan including No Pre-medication, hold oxaliplatin, 5-FU pump alone  and no Udenyca

## 2023-03-13 NOTE — Progress Notes (Signed)
[name] OFFICE PROGRESS NOTE   Diagnosis:  Colon cancer  INTERVAL HISTORY:   [name] [name] returns as scheduled. He completed cycle 6 FOLFOX 02/27/2023.  About 2 days after the pump was removed he developed diarrhea, not as severe as with previous cycles.  Estimates 5-6 times a day, lasted for 3 days.  Diarrhea medication helped.  He saw bright red blood on the toilet tissue and in the toilet bowl during this time.  He had mild nausea.  No vomiting.  No mouth sores.  He has persistent cold sensitivity involving the fingertips and mouth.  No numbness or tingling in the absence of cold exposure.  Objective:  Vital signs in last 24 hours:  Blood pressure 133/89, pulse 64, temperature 98.1 F (36.7 C), temperature source Oral, resp. rate 18, height 5\' 4"  (1.626 m), weight 181 lb 6.4 oz (82.3 kg), SpO2 100%.    HEENT: No thrush or ulcers. Resp: Lungs clear bilaterally. Cardio: Regular rate and rhythm. GI: Abdomen soft and nontender.  No hepatosplenomegaly. Vascular: No leg edema. Neuro: Vibratory sense with mild to moderate decrease over the fingertips per tuning fork exam. Skin: Palms without erythema. Port-A-Cath without erythema.  Lab Results:  Lab Results  Component Value Date   WBC 13.0 (H) 03/13/2023   HGB 12.2 (L) 03/13/2023   HCT 37.7 (L) 03/13/2023   MCV 94.3 03/13/2023   PLT 200 03/13/2023   NEUTROABS 9.7 (H) 03/13/2023    Imaging:  No results found.  Medications: I have reviewed the patient's current medications.  Assessment/Plan: Colon cancer, stage IIIb (pT4a, pN1a) Colonoscopy 10/10/2022 -circumferential fungating mass thought to represent the proximal descending colon at 70 cm from the anus that could not be passed with the scope.  Biopsy showed adenocarcinoma, at least intramucosal; intact expression of all 4 mismatch repair proteins.   CT scans 10/17/2022-2 noncalcified nodules adjacent to the horizontal fissure within the right thorax between  the right upper lobe and right middle lobe measuring 11 and 7 mm in size; no other pulmonary nodules; 10 mm right paratracheal lymph node liver was negative for suspicious findings; spleen with 2 small densities; a constricting lesion of the distal transverse colon suspected seen to the left of midline adjacent to the splenic flexure measuring 3.6 cm in length; significant narrowing of the lumen of the bowel at this location with bowel wall thickening; hazy stranding within the adjacent mesenteric fat; along the superior margin of the lesion and exophytic 12 mm density possibly representing local invasion and/or adjacent adenopathy.   CEA 10/17/2022 3.0 (normal range for non-smokers less than 3.0, smokers less than 5.0) Laparoscopic extended right hemicolectomy 11/05/2022 by Dr. Byrd Hesselbach.  Final pathology showed 6 cm invasive moderately differentiated adenocarcinoma in the distal transverse colon, invading the visceral peritoneum; resection margins negative; positive lymphovascular and perineural invasion; 1 out of 26 lymph nodes positive for carcinoma; no tumor deposits identified; appendiceal serrated lesion with low-grade dysplasia; pT4a, pN1a Cycle 1 FOLFOX 12/19/2022 Cycle 2 FOLFOX 01/02/2023 Cycle 3 FOLFOX 01/16/2023 Cycle 4 FOLFOX 01/30/2023 Cycle 5 FOLFOX 02/13/2023-5-FU leucovorin dose reduced secondary to diarrhea Cycle 6 FOLFOX 02/27/2023-5-FU bolus eliminated, 5-FU pump dose reduced due to diarrhea; oxaliplatin dose reduced due to thrombocytopenia Cycle 7 FOLFOX 03/13/2023-oxaliplatin held due to persistent cold sensitivity   Iron deficiency anemia-he reports receiving IV iron prior to the colon surgery 12/18/2022 ferritin 11, hemoglobin 11.1/MCV 79 12/18/2022 ferrous sulfate 1 tablet daily Hypertension History of kidney stones Colonoscopy 10/10/2022-mass could not be passed with  the scope Port-A-Cath placement, Interventional Radiology, 12/17/2022 Admission 02/18/2023 with abdominal pain and  diarrhea CT abdomen/pelvis 02/18/2023-multiple renal calculi, mucosa enhancement in the sigmoid colon, "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  SMV thrombus on CT 02/18/2023-heparin, transitioned to Xarelto  Disposition: [name] [name] appears stable.  He has completed 6 cycles of FOLFOX.  He again had diarrhea following cycle 6, improved as compared to previous cycles.  He has progressive neuropathy symptoms.  We decided to hold oxaliplatin with today's treatment, proceed with 5-FU pump alone.  [name] will be held with this cycle.  He agrees with this plan.  He will contact the office with poorly controlled diarrhea.  CBC and chemistry panel reviewed.  Labs adequate to proceed with treatment as above.  He will return for follow-up and treatment in 2 weeks.    Lonna Cobb ANP/GNP-BC   03/13/2023  9:21 AM

## 2023-03-15 ENCOUNTER — Inpatient Hospital Stay: Payer: BC Managed Care – PPO

## 2023-03-15 ENCOUNTER — Other Ambulatory Visit: Payer: Self-pay

## 2023-03-15 VITALS — BP 126/90 | HR 75 | Temp 98.1°F | Resp 18

## 2023-03-15 DIAGNOSIS — C184 Malignant neoplasm of transverse colon: Secondary | ICD-10-CM

## 2023-03-15 DIAGNOSIS — Z5111 Encounter for antineoplastic chemotherapy: Secondary | ICD-10-CM | POA: Diagnosis not present

## 2023-03-15 MED ORDER — SODIUM CHLORIDE 0.9% FLUSH
10.0000 mL | INTRAVENOUS | Status: DC | PRN
  Administered 2023-03-15: 10 mL

## 2023-03-15 MED ORDER — HEPARIN SOD (PORK) LOCK FLUSH 100 UNIT/ML IV SOLN
500.0000 [IU] | Freq: Once | INTRAVENOUS | Status: AC | PRN
  Administered 2023-03-15: 500 [IU]

## 2023-03-15 NOTE — Patient Instructions (Signed)
Implanted [facility] Guide An implanted port is a device that is placed under the skin. It is usually placed in the chest. The device may vary based on the need. Implanted ports can be used to give IV medicine, to take blood, or to give fluids. You may have an implanted port if: You need IV medicine that would be irritating to the small veins in your hands or arms. You need IV medicines, such as chemotherapy, for a long period of time. You need IV nutrition for a long period of time. You may have fewer limitations when using a port than you would if you used other types of long-term IVs. You will also likely be able to return to normal activities after your incision heals. An implanted port has two main parts: Reservoir. The reservoir is the part where a needle is inserted to give medicines or draw blood. The reservoir is round. After the port is placed, it appears as a small, raised area under your skin. Catheter. The catheter is a small, thin tube that connects the reservoir to a vein. Medicine that is inserted into the reservoir goes into the catheter and then into the vein. How is my port accessed? To access your port: A numbing cream may be placed on the skin over the port site. Your health care provider will put on a mask and sterile gloves. The skin over your port will be cleaned carefully with a germ-killing soap and allowed to dry. Your health care provider will gently pinch the port and insert a needle into it. Your health care provider will check for a blood return to make sure the port is in the vein and is still working (patent). If your port needs to remain accessed to get medicine continuously (constant infusion), your health care provider will place a clear bandage (dressing) over the needle site. The dressing and needle will need to be changed every week, or as told by your health care provider. What is flushing? Flushing helps keep the port working. Follow instructions from your  health care provider about how and when to flush the port. Ports are usually flushed with saline solution or a medicine called heparin. The need for flushing will depend on how the port is used: If the port is only used from time to time to give medicines or draw blood, the port may need to be flushed: Before and after medicines have been given. Before and after blood has been drawn. As part of routine maintenance. Flushing may be recommended every 4-6 weeks. If a constant infusion is running, the port may not need to be flushed. Throw away any syringes in a disposal container that is meant for sharp items (sharps container). You can buy a sharps container from a pharmacy, or you can make one by using an empty hard plastic bottle with a cover. How long will my port stay implanted? The port can stay in for as long as your health care provider thinks it is needed. When it is time for the port to come out, a surgery will be done to remove it. The surgery will be similar to the procedure that was done to put the port in. Follow these instructions at home: Caring for your port and port site Flush your port as told by your health care provider. If you need an infusion over several days, follow instructions from your health care provider about how to take care of your port site. Make sure you: Change your  dressing as told by your health care provider. Wash your hands with soap and water for at least 20 seconds before and after you change your dressing. If soap and water are not available, use alcohol-based hand sanitizer. Place any used dressings or infusion bags into a plastic bag. Throw that bag in the trash. Keep the dressing that covers the needle clean and dry. Do not get it wet. Do not use scissors or sharp objects near the infusion tubing. Keep any external tubes clamped, unless they are being used. Check your port site every day for signs of infection. Check for: Redness, swelling, or  pain. Fluid or blood. Warmth. Pus or a bad smell. Protect the skin around the port site. Avoid wearing bra straps that rub or irritate the site. Protect the skin around your port from seat belts. Place a soft pad over your chest if needed. Bathe or shower as told by your health care provider. The site may get wet as long as you are not actively receiving an infusion. General instructions  Return to your normal activities as told by your health care provider. Ask your health care provider what activities are safe for you. Carry a medical alert card or wear a medical alert bracelet at all times. This will let health care providers know that you have an implanted port in case of an emergency. Where to find more information American Cancer Society: www.cancer.org American Society of Clinical Oncology: www.cancer.net Contact a health care provider if: You have a fever or chills. You have redness, swelling, or pain at the port site. You have fluid or blood coming from your port site. Your incision feels warm to the touch. You have pus or a bad smell coming from the port site. Summary Implanted ports are usually placed in the chest for long-term IV access. Follow instructions from your health care provider about flushing the port and changing bandages (dressings). Take care of the area around your port by avoiding clothing that puts pressure on the area, and by watching for signs of infection. Protect the skin around your port from seat belts. Place a soft pad over your chest if needed. Contact a health care provider if you have a fever or you have redness, swelling, pain, fluid, or a bad smell at the port site. This information is not intended to replace advice given to you by your health care provider. Make sure you discuss any questions you have with your health care provider. Document Revised: 12/20/2020 Document Reviewed: 12/20/2020 Elsevier Patient Education  2024 [employer].

## 2023-03-20 ENCOUNTER — Other Ambulatory Visit: Payer: Self-pay | Admitting: Oncology

## 2023-03-21 ENCOUNTER — Other Ambulatory Visit: Payer: Self-pay | Admitting: Oncology

## 2023-03-27 ENCOUNTER — Inpatient Hospital Stay: Payer: BC Managed Care – PPO

## 2023-03-27 ENCOUNTER — Inpatient Hospital Stay (HOSPITAL_BASED_OUTPATIENT_CLINIC_OR_DEPARTMENT_OTHER): Payer: BC Managed Care – PPO | Admitting: Oncology

## 2023-03-27 ENCOUNTER — Encounter: Payer: Self-pay | Admitting: *Deleted

## 2023-03-27 ENCOUNTER — Encounter: Payer: Self-pay | Admitting: Oncology

## 2023-03-27 VITALS — BP 139/90 | HR 79 | Temp 98.2°F | Resp 18 | Ht 64.0 in | Wt 180.4 lb

## 2023-03-27 VITALS — BP 136/89 | HR 72 | Resp 18

## 2023-03-27 DIAGNOSIS — Z5111 Encounter for antineoplastic chemotherapy: Secondary | ICD-10-CM | POA: Diagnosis not present

## 2023-03-27 DIAGNOSIS — C184 Malignant neoplasm of transverse colon: Secondary | ICD-10-CM | POA: Diagnosis not present

## 2023-03-27 LAB — CBC WITH DIFFERENTIAL (CANCER CENTER ONLY)
Abs Immature Granulocytes: 0.01 10*3/uL (ref 0.00–0.07)
Basophils Absolute: 0.1 10*3/uL (ref 0.0–0.1)
Basophils Relative: 1 %
Eosinophils Absolute: 1.1 10*3/uL — ABNORMAL HIGH (ref 0.0–0.5)
Eosinophils Relative: 22 %
HCT: 39 % (ref 39.0–52.0)
Hemoglobin: 13 g/dL (ref 13.0–17.0)
Immature Granulocytes: 0 %
Lymphocytes Relative: 16 %
Lymphs Abs: 0.8 10*3/uL (ref 0.7–4.0)
MCH: 32.2 pg (ref 26.0–34.0)
MCHC: 33.3 g/dL (ref 30.0–36.0)
MCV: 96.5 fL (ref 80.0–100.0)
Monocytes Absolute: 0.3 10*3/uL (ref 0.1–1.0)
Monocytes Relative: 6 %
Neutro Abs: 2.7 10*3/uL (ref 1.7–7.7)
Neutrophils Relative %: 55 %
Platelet Count: 189 10*3/uL (ref 150–400)
RBC: 4.04 MIL/uL — ABNORMAL LOW (ref 4.22–5.81)
RDW: 22.5 % — ABNORMAL HIGH (ref 11.5–15.5)
WBC Count: 4.9 10*3/uL (ref 4.0–10.5)
nRBC: 0 % (ref 0.0–0.2)

## 2023-03-27 LAB — CMP (CANCER CENTER ONLY)
ALT: 22 U/L (ref 0–44)
AST: 28 U/L (ref 15–41)
Albumin: 4.1 g/dL (ref 3.5–5.0)
Alkaline Phosphatase: 86 U/L (ref 38–126)
Anion gap: 9 (ref 5–15)
BUN: 7 mg/dL — ABNORMAL LOW (ref 8–23)
CO2: 26 mmol/L (ref 22–32)
Calcium: 9.1 mg/dL (ref 8.9–10.3)
Chloride: 105 mmol/L (ref 98–111)
Creatinine: 0.86 mg/dL (ref 0.61–1.24)
GFR, Estimated: >60 mL/min (ref >60)
Glucose, Bld: 144 mg/dL — ABNORMAL HIGH (ref 70–99)
Potassium: 4 mmol/L (ref 3.5–5.1)
Sodium: 140 mmol/L (ref 135–145)
Total Bilirubin: 0.8 mg/dL (ref 0.3–1.2)
Total Protein: 7 g/dL (ref 6.5–8.1)

## 2023-03-27 MED ORDER — SODIUM CHLORIDE 0.9 % IV SOLN
600.0000 mg/m2 | INTRAVENOUS | Status: DC
  Administered 2023-03-27: 1150 mg via INTRAVENOUS
  Filled 2023-03-27: qty 23

## 2023-03-27 NOTE — Patient Instructions (Signed)
[facility] [facility]   Discharge Instructions: Thank you for choosing [facility] to provide your oncology and hematology care.   If you have a lab appointment with the Cancer Center, please go directly to the Cancer Center and check in at the registration area.   Wear comfortable clothing and clothing appropriate for easy access to any Portacath or PICC line.   We strive to give you quality time with your provider. You may need to reschedule your appointment if you arrive late (15 or more minutes).  Arriving late affects you and other patients whose appointments are after yours.  Also, if you miss three or more appointments without notifying the office, you may be dismissed from the clinic at the provider's discretion.      For prescription refill requests, have your pharmacy contact our office and allow 72 hours for refills to be completed.    Today you received the following chemotherapy and/or immunotherapy agents Flourouracil (ADRUCIL).      To help prevent nausea and vomiting after your treatment, we encourage you to take your nausea medication as directed.  BELOW ARE SYMPTOMS THAT SHOULD BE REPORTED IMMEDIATELY: *FEVER GREATER THAN 100.4 F (38 C) OR HIGHER *CHILLS OR SWEATING *NAUSEA AND VOMITING THAT IS NOT CONTROLLED WITH YOUR NAUSEA MEDICATION *UNUSUAL SHORTNESS OF BREATH *UNUSUAL BRUISING OR BLEEDING *URINARY PROBLEMS (pain or burning when urinating, or frequent urination) *BOWEL PROBLEMS (unusual diarrhea, constipation, pain near the anus) TENDERNESS IN MOUTH AND THROAT WITH OR WITHOUT PRESENCE OF ULCERS (sore throat, sores in mouth, or a toothache) UNUSUAL RASH, SWELLING OR PAIN  UNUSUAL VAGINAL DISCHARGE OR ITCHING   Items with * indicate a potential emergency and should be followed up as soon as possible or go to the Emergency Department if any problems should occur.  Please show the CHEMOTHERAPY ALERT CARD or IMMUNOTHERAPY ALERT  CARD at check-in to the Emergency Department and triage nurse.  Should you have questions after your visit or need to cancel or reschedule your appointment, please contact [facility]  Dept: [phone]  and follow the prompts.  Office hours are 8:00 a.m. to 4:30 p.m. Monday - Friday. Please note that voicemails left after 4:00 p.m. may not be returned until the following business day.  We are closed weekends and major holidays. You have access to a nurse at all times for urgent questions. Please call the main number to the clinic Dept: [phone] and follow the prompts.   For any non-urgent questions, you may also contact your provider using MyChart. We now offer e-Visits for anyone 58 and older to request care online for non-urgent symptoms. For details visit [email or web address].   Also download the MyChart app! Go to the app store, search "MyChart", open the app, select [facility], and log in with your MyChart username and password.  Fluorouracil Injection What is this medication? FLUOROURACIL (flure oh YOOR a sil) treats some types of cancer. It works by slowing down the growth of cancer cells. This medicine may be used for other purposes; ask your health care provider or pharmacist if you have questions. COMMON BRAND NAME(S): Adrucil What should I tell my care team before I take this medication? They need to know if you have any of these conditions: Blood disorders Dihydropyrimidine dehydrogenase (DPD) deficiency Infection, such as chickenpox, cold sores, herpes Kidney disease Liver disease Poor nutrition Recent or ongoing radiation therapy An unusual or allergic reaction to fluorouracil, other medications,  foods, dyes, or preservatives If you or your partner are pregnant or trying to get pregnant Breast-feeding How should I use this medication? This medication is injected into a vein. It is administered by your care team in a hospital or  clinic setting. Talk to your care team about the use of this medication in children. Special care may be needed. Overdosage: If you think you have taken too much of this medicine contact a poison control center or emergency room at once. NOTE: This medicine is only for you. Do not share this medicine with others. What if I miss a dose? Keep appointments for follow-up doses. It is important not to miss your dose. Call your care team if you are unable to keep an appointment. What may interact with this medication? Do not take this medication with any of the following: Live virus vaccines This medication may also interact with the following: Medications that treat or prevent blood clots, such as warfarin, enoxaparin, dalteparin This list may not describe all possible interactions. Give your health care provider a list of all the medicines, herbs, non-prescription drugs, or dietary supplements you use. Also tell them if you smoke, drink alcohol, or use illegal drugs. Some items may interact with your medicine. What should I watch for while using this medication? Your condition will be monitored carefully while you are receiving this medication. This medication may make you feel generally unwell. This is not uncommon as chemotherapy can affect healthy cells as well as cancer cells. Report any side effects. Continue your course of treatment even though you feel ill unless your care team tells you to stop. In some cases, you may be given additional medications to help with side effects. Follow all directions for their use. This medication may increase your risk of getting an infection. Call your care team for advice if you get a fever, chills, sore throat, or other symptoms of a cold or flu. Do not treat yourself. Try to avoid being around people who are sick. This medication may increase your risk to bruise or bleed. Call your care team if you notice any unusual bleeding. Be careful brushing or flossing  your teeth or using a toothpick because you may get an infection or bleed more easily. If you have any dental work done, tell your dentist you are receiving this medication. Avoid taking medications that contain aspirin, acetaminophen, ibuprofen, naproxen, or ketoprofen unless instructed by your care team. These medications may hide a fever. Do not treat diarrhea with over the counter products. Contact your care team if you have diarrhea that lasts more than 2 days or if it is severe and watery. This medication can make you more sensitive to the sun. Keep out of the sun. If you cannot avoid being in the sun, wear protective clothing and sunscreen. Do not use sun lamps, tanning beds, or tanning booths. Talk to your care team if you or your partner wish to become pregnant or think you might be pregnant. This medication can cause serious birth defects if taken during pregnancy and for 3 months after the last dose. A reliable form of contraception is recommended while taking this medication and for 3 months after the last dose. Talk to your care team about effective forms of contraception. Do not father a child while taking this medication and for 3 months after the last dose. Use a condom while having sex during this time period. Do not breastfeed while taking this medication. This medication may cause infertility.  Talk to your care team if you are concerned about your fertility. What side effects may I notice from receiving this medication? Side effects that you should report to your care team as soon as possible: Allergic reactions--skin rash, itching, hives, swelling of the face, lips, tongue, or throat Heart attack--pain or tightness in the chest, shoulders, arms, or jaw, nausea, shortness of breath, cold or clammy skin, feeling faint or lightheaded Heart failure--shortness of breath, swelling of the ankles, feet, or hands, sudden weight gain, unusual weakness or fatigue Heart rhythm changes--fast or  irregular heartbeat, dizziness, feeling faint or lightheaded, chest pain, trouble breathing High ammonia level--unusual weakness or fatigue, confusion, loss of appetite, nausea, vomiting, seizures Infection--fever, chills, cough, sore throat, wounds that don't heal, pain or trouble when passing urine, general feeling of discomfort or being unwell Low red blood cell level--unusual weakness or fatigue, dizziness, headache, trouble breathing Pain, tingling, or numbness in the hands or feet, muscle weakness, change in vision, confusion or trouble speaking, loss of balance or coordination, trouble walking, seizures Redness, swelling, and blistering of the skin over hands and feet Severe or prolonged diarrhea Unusual bruising or bleeding Side effects that usually do not require medical attention (report to your care team if they continue or are bothersome): Dry skin Headache Increased tears Nausea Pain, redness, or swelling with sores inside the mouth or throat Sensitivity to light Vomiting This list may not describe all possible side effects. Call your doctor for medical advice about side effects. You may report side effects to FDA at 1-800-FDA-1088. Where should I keep my medication? This medication is given in a hospital or clinic. It will not be stored at home. NOTE: This sheet is a summary. It may not cover all possible information. If you have questions about this medicine, talk to your doctor, pharmacist, or health care provider.  2024 Elsevier/Gold Standard (2021-10-24 00:00:00)  The chemotherapy medication bag should finish at 46 hours, 96 hours, or 7 days. For example, if your pump is scheduled for 46 hours and it was put on at 4:00 p.m., it should finish at 2:00 p.m. the day it is scheduled to come off regardless of your appointment time.     Estimated time to finish at 11:30 a.m. on Friday 03/29/2023.   If the display on your pump reads "Low Volume" and it is beeping, take the  batteries out of the pump and come to the cancer center for it to be taken off.   If the pump alarms go off prior to the pump reading "Low Volume" then call [phone] and someone can assist you.  If the plunger comes out and the chemotherapy medication is leaking out, please use your home chemo spill kit to clean up the spill. Do NOT use paper towels or other household products.  If you have problems or questions regarding your pump, please call either [phone] (24 hours a day) or the cancer center Monday-Friday 8:00 a.m.- 4:30 p.m. at the clinic number and we will assist you. If you are unable to get assistance, then go to the nearest Emergency Department and ask the staff to contact the IV team for assistance.

## 2023-03-27 NOTE — Progress Notes (Signed)
[facility] OFFICE PROGRESS NOTE   Diagnosis: Colon cancer  INTERVAL HISTORY:   [name] [name] completed another cycle of infusional 5-FU beginning 03/13/2023.  No mouth sores, nausea, or diarrhea.  He has persistent cold sensitivity and altered taste.  No peripheral numbness.  Objective:  Vital signs in last 24 hours:  Blood pressure (!) 139/90, pulse 79, temperature 98.2 F (36.8 C), temperature source Oral, resp. rate 18, height 5\' 4"  (1.626 m), weight 180 lb 6.4 oz (81.8 kg), SpO2 100%.    HEENT: No thrush or ulcers Resp: Lungs clear bilaterally Cardio: Regular rate and rhythm GI: No hepatosplenomegaly, nontender Vascular: No leg edema Neuro: Moderate loss of vibratory sense at the fingertips bilaterally  Portacath/PICC-without erythema  Lab Results:  Lab Results  Component Value Date   WBC 4.9 03/27/2023   HGB 13.0 03/27/2023   HCT 39.0 03/27/2023   MCV 96.5 03/27/2023   PLT 189 03/27/2023   NEUTROABS 2.7 03/27/2023    CMP  Lab Results  Component Value Date   NA 140 03/27/2023   K 4.0 03/27/2023   CL 105 03/27/2023   CO2 26 03/27/2023   GLUCOSE 144 (H) 03/27/2023   BUN 7 (L) 03/27/2023   CREATININE 0.86 03/27/2023   CALCIUM 9.1 03/27/2023   PROT 7.0 03/27/2023   ALBUMIN 4.1 03/27/2023   AST 28 03/27/2023   ALT 22 03/27/2023   ALKPHOS 86 03/27/2023   BILITOT 0.8 03/27/2023   GFRNONAA >60 03/27/2023   GFRAA >60 04/13/2018    Lab Results  Component Value Date   CEA 1.63 12/18/2022     Medications: I have reviewed the patient's current medications.   Assessment/Plan:  Colon cancer, stage IIIb (pT4a, pN1a) Colonoscopy 10/10/2022 -circumferential fungating mass thought to represent the proximal descending colon at 70 cm from the anus that could not be passed with the scope.  Biopsy showed adenocarcinoma, at least intramucosal; intact expression of all 4 mismatch repair proteins.   CT scans 10/17/2022-2 noncalcified nodules adjacent to  the horizontal fissure within the right thorax between the right upper lobe and right middle lobe measuring 11 and 7 mm in size; no other pulmonary nodules; 10 mm right paratracheal lymph node liver was negative for suspicious findings; spleen with 2 small densities; a constricting lesion of the distal transverse colon suspected seen to the left of midline adjacent to the splenic flexure measuring 3.6 cm in length; significant narrowing of the lumen of the bowel at this location with bowel wall thickening; hazy stranding within the adjacent mesenteric fat; along the superior margin of the lesion and exophytic 12 mm density possibly representing local invasion and/or adjacent adenopathy.   CEA 10/17/2022 3.0 (normal range for non-smokers less than 3.0, smokers less than 5.0) Laparoscopic extended right hemicolectomy 11/05/2022 by Dr. Byrd Hesselbach.  Final pathology showed 6 cm invasive moderately differentiated adenocarcinoma in the distal transverse colon, invading the visceral peritoneum; resection margins negative; positive lymphovascular and perineural invasion; 1 out of 26 lymph nodes positive for carcinoma; no tumor deposits identified; appendiceal serrated lesion with low-grade dysplasia; pT4a, pN1a Cycle 1 FOLFOX 12/19/2022 Cycle 2 FOLFOX 01/02/2023 Cycle 3 FOLFOX 01/16/2023 Cycle 4 FOLFOX 01/30/2023 Cycle 5 FOLFOX 02/13/2023-5-FU leucovorin dose reduced secondary to diarrhea Cycle 6 FOLFOX 02/27/2023-5-FU bolus eliminated, 5-FU pump dose reduced due to diarrhea; oxaliplatin dose reduced due to thrombocytopenia Cycle 7 FOLFOX 03/13/2023-oxaliplatin held due to persistent cold sensitivity Cycle 8 FOLFOX 03/27/2023-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   Iron deficiency anemia-he reports receiving IV iron  prior to the colon surgery 12/18/2022 ferritin 11, hemoglobin 11.1/MCV 79 12/18/2022 ferrous sulfate 1 tablet daily Hypertension History of kidney stones Colonoscopy 10/10/2022-mass could not be passed with the  scope Port-A-Cath placement, Interventional Radiology, 12/17/2022 Admission 02/18/2023 with abdominal pain and diarrhea CT abdomen/pelvis 02/18/2023-multiple renal calculi, mucosa enhancement in the sigmoid colon, "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  SMV thrombus on CT 02/18/2023-heparin, transitioned to Xarelto   Disposition: [name] [name] appears stable.  He tolerated the 5-FU well with elevation of the 5-FU bolus and further decrease of the 5-FU infusion dose.  He has oxaliplatin neuropathy.  Oxaliplatin will remain on hold.  Will complete another cycle of infusional 5-FU today.  [name] [name] will return for an office visit and chemotherapy in 2 weeks.  We will consider resuming oxaliplatin based on his symptoms in 2 weeks.  We will consider dose escalating the 5-FU pump.  [name] Papas, MD  03/27/2023  12:01 PM

## 2023-03-27 NOTE — Progress Notes (Signed)
Patient seen by Dr. Thornton Papas today  Vitals are within treatment parameters:Yes   Labs are within treatment parameters: Yes   Treatment plan has been signed: Yes   Per physician team, Patient is ready for treatment. Please note the following modifications:MD has removed Oxaliplatin, Leucovorin, 5FU bolus,  premeds and Udenyca from careplan.

## 2023-03-29 ENCOUNTER — Inpatient Hospital Stay: Payer: BC Managed Care – PPO

## 2023-03-29 VITALS — BP 146/91 | HR 74 | Temp 97.5°F | Resp 18

## 2023-03-29 DIAGNOSIS — C184 Malignant neoplasm of transverse colon: Secondary | ICD-10-CM

## 2023-03-29 DIAGNOSIS — Z5111 Encounter for antineoplastic chemotherapy: Secondary | ICD-10-CM | POA: Diagnosis not present

## 2023-03-29 MED ORDER — HEPARIN SOD (PORK) LOCK FLUSH 100 UNIT/ML IV SOLN
500.0000 [IU] | Freq: Once | INTRAVENOUS | Status: AC | PRN
  Administered 2023-03-29: 500 [IU]

## 2023-03-29 MED ORDER — SODIUM CHLORIDE 0.9% FLUSH
10.0000 mL | INTRAVENOUS | Status: DC | PRN
  Administered 2023-03-29: 10 mL

## 2023-03-29 NOTE — Patient Instructions (Signed)

## 2023-04-07 ENCOUNTER — Other Ambulatory Visit: Payer: Self-pay | Admitting: Oncology

## 2023-04-10 ENCOUNTER — Inpatient Hospital Stay: Payer: BC Managed Care – PPO

## 2023-04-10 ENCOUNTER — Inpatient Hospital Stay (HOSPITAL_BASED_OUTPATIENT_CLINIC_OR_DEPARTMENT_OTHER): Payer: BC Managed Care – PPO | Admitting: Nurse Practitioner

## 2023-04-10 ENCOUNTER — Encounter: Payer: Self-pay | Admitting: Nurse Practitioner

## 2023-04-10 ENCOUNTER — Inpatient Hospital Stay: Payer: BC Managed Care – PPO | Attending: Nurse Practitioner

## 2023-04-10 VITALS — BP 134/92 | HR 100 | Temp 98.1°F | Resp 18 | Ht 64.0 in | Wt 184.1 lb

## 2023-04-10 DIAGNOSIS — Z5111 Encounter for antineoplastic chemotherapy: Secondary | ICD-10-CM | POA: Insufficient documentation

## 2023-04-10 DIAGNOSIS — F17200 Nicotine dependence, unspecified, uncomplicated: Secondary | ICD-10-CM | POA: Diagnosis not present

## 2023-04-10 DIAGNOSIS — C184 Malignant neoplasm of transverse colon: Secondary | ICD-10-CM

## 2023-04-10 DIAGNOSIS — I10 Essential (primary) hypertension: Secondary | ICD-10-CM | POA: Insufficient documentation

## 2023-04-10 DIAGNOSIS — R918 Other nonspecific abnormal finding of lung field: Secondary | ICD-10-CM | POA: Diagnosis not present

## 2023-04-10 DIAGNOSIS — D509 Iron deficiency anemia, unspecified: Secondary | ICD-10-CM | POA: Diagnosis not present

## 2023-04-10 DIAGNOSIS — D696 Thrombocytopenia, unspecified: Secondary | ICD-10-CM | POA: Insufficient documentation

## 2023-04-10 DIAGNOSIS — R197 Diarrhea, unspecified: Secondary | ICD-10-CM | POA: Insufficient documentation

## 2023-04-10 DIAGNOSIS — G629 Polyneuropathy, unspecified: Secondary | ICD-10-CM | POA: Diagnosis not present

## 2023-04-10 DIAGNOSIS — Z79899 Other long term (current) drug therapy: Secondary | ICD-10-CM | POA: Diagnosis not present

## 2023-04-10 DIAGNOSIS — Z87442 Personal history of urinary calculi: Secondary | ICD-10-CM | POA: Insufficient documentation

## 2023-04-10 DIAGNOSIS — Z79631 Long term (current) use of antimetabolite agent: Secondary | ICD-10-CM | POA: Diagnosis not present

## 2023-04-10 LAB — CBC WITH DIFFERENTIAL (CANCER CENTER ONLY)
Abs Immature Granulocytes: 0.01 10*3/uL (ref 0.00–0.07)
Basophils Absolute: 0.1 10*3/uL (ref 0.0–0.1)
Basophils Relative: 1 %
Eosinophils Absolute: 1.2 10*3/uL — ABNORMAL HIGH (ref 0.0–0.5)
Eosinophils Relative: 24 %
HCT: 40.1 % (ref 39.0–52.0)
Hemoglobin: 13.5 g/dL (ref 13.0–17.0)
Immature Granulocytes: 0 %
Lymphocytes Relative: 24 %
Lymphs Abs: 1.2 10*3/uL (ref 0.7–4.0)
MCH: 33.2 pg (ref 26.0–34.0)
MCHC: 33.7 g/dL (ref 30.0–36.0)
MCV: 98.5 fL (ref 80.0–100.0)
Monocytes Absolute: 0.3 10*3/uL (ref 0.1–1.0)
Monocytes Relative: 5 %
Neutro Abs: 2.3 10*3/uL (ref 1.7–7.7)
Neutrophils Relative %: 46 %
Platelet Count: 187 10*3/uL (ref 150–400)
RBC: 4.07 MIL/uL — ABNORMAL LOW (ref 4.22–5.81)
RDW: 17.7 % — ABNORMAL HIGH (ref 11.5–15.5)
WBC Count: 4.9 10*3/uL (ref 4.0–10.5)
nRBC: 0 % (ref 0.0–0.2)

## 2023-04-10 LAB — CMP (CANCER CENTER ONLY)
ALT: 29 U/L (ref 0–44)
AST: 31 U/L (ref 15–41)
Albumin: 4.2 g/dL (ref 3.5–5.0)
Alkaline Phosphatase: 69 U/L (ref 38–126)
Anion gap: 9 (ref 5–15)
BUN: 9 mg/dL (ref 8–23)
CO2: 26 mmol/L (ref 22–32)
Calcium: 8.8 mg/dL — ABNORMAL LOW (ref 8.9–10.3)
Chloride: 105 mmol/L (ref 98–111)
Creatinine: 0.86 mg/dL (ref 0.61–1.24)
GFR, Estimated: >60 mL/min (ref >60)
Glucose, Bld: 134 mg/dL — ABNORMAL HIGH (ref 70–99)
Potassium: 3.9 mmol/L (ref 3.5–5.1)
Sodium: 140 mmol/L (ref 135–145)
Total Bilirubin: 0.5 mg/dL (ref 0.3–1.2)
Total Protein: 6.9 g/dL (ref 6.5–8.1)

## 2023-04-10 MED ORDER — SODIUM CHLORIDE 0.9 % IV SOLN
1000.0000 mg/m2 | INTRAVENOUS | Status: DC
  Administered 2023-04-10: 2000 mg via INTRAVENOUS
  Filled 2023-04-10: qty 40

## 2023-04-10 NOTE — Progress Notes (Signed)
Per Lonna Cobb, NP office note, no oxaliplatin today. 5FU pump only.

## 2023-04-10 NOTE — Patient Instructions (Addendum)
[place] [facility]  Discharge Instructions: Thank you for choosing [facility] to provide your oncology and hematology care.   If you have a lab appointment with the Cancer Center, please go directly to the Cancer Center and check in at the registration area.   Wear comfortable clothing and clothing appropriate for easy access to any Portacath or PICC line.   We strive to give you quality time with your provider. You may need to reschedule your appointment if you arrive late (15 or more minutes).  Arriving late affects you and other patients whose appointments are after yours.  Also, if you miss three or more appointments without notifying the office, you may be dismissed from the clinic at the provider's discretion.      For prescription refill requests, have your pharmacy contact our office and allow 72 hours for refills to be completed.    Today you received the following chemotherapy and/or immunotherapy agents: Fluorouracil/5FU.      To help prevent nausea and vomiting after your treatment, we encourage you to take your nausea medication as directed.  BELOW ARE SYMPTOMS THAT SHOULD BE REPORTED IMMEDIATELY: *FEVER GREATER THAN 100.4 F (38 C) OR HIGHER *CHILLS OR SWEATING *NAUSEA AND VOMITING THAT IS NOT CONTROLLED WITH YOUR NAUSEA MEDICATION *UNUSUAL SHORTNESS OF BREATH *UNUSUAL BRUISING OR BLEEDING *URINARY PROBLEMS (pain or burning when urinating, or frequent urination) *BOWEL PROBLEMS (unusual diarrhea, constipation, pain near the anus) TENDERNESS IN MOUTH AND THROAT WITH OR WITHOUT PRESENCE OF ULCERS (sore throat, sores in mouth, or a toothache) UNUSUAL RASH, SWELLING OR PAIN  UNUSUAL VAGINAL DISCHARGE OR ITCHING   Items with * indicate a potential emergency and should be followed up as soon as possible or go to the Emergency Department if any problems should occur.  Please show the CHEMOTHERAPY ALERT CARD or IMMUNOTHERAPY ALERT CARD at  check-in to the Emergency Department and triage nurse.  Should you have questions after your visit or need to cancel or reschedule your appointment, please contact [place] [facility]  Dept: [phone]  and follow the prompts.  Office hours are 8:00 a.m. to 4:30 p.m. Monday - Friday. Please note that voicemails left after 4:00 p.m. may not be returned until the following business day.  We are closed weekends and major holidays. You have access to a nurse at all times for urgent questions. Please call the main number to the clinic Dept: [phone] and follow the prompts.   For any non-urgent questions, you may also contact your provider using MyChart. We now offer e-Visits for anyone 61 and older to request care online for non-urgent symptoms. For details visit [email or web address].   Also download the MyChart app! Go to the app store, search "MyChart", open the app, select , and log in with your MyChart username and password.

## 2023-04-10 NOTE — Progress Notes (Signed)
[facility] OFFICE PROGRESS NOTE   Diagnosis: Colon cancer  INTERVAL HISTORY:   [name] [name] returns as scheduled.  He completed another cycle of infusional 5-FU 03/27/2023.  He denies nausea/vomiting.  No mouth sores.  No diarrhea.  No bleeding.  No hand or foot pain or redness.  He notes cold sensitivity increases after each treatment.  No numbness or tingling in the hands or feet without cold exposure.  He has significant pruritus that is unrelieved with scratching on both feet at nighttime.  Objective:  Vital signs in last 24 hours:  Blood pressure (!) 134/92, pulse 100, temperature 98.1 F (36.7 C), temperature source Temporal, resp. rate 18, height 5\' 4"  (1.626 m), weight 184 lb 1.6 oz (83.5 kg), SpO2 96%.    HEENT: No thrush or ulcers. Resp: Lungs clear bilaterally. Cardio: Regular rate and rhythm. GI: No hepatosplenomegaly. Vascular: No leg edema. Skin: Palms without erythema. Port-A-Cath without erythema.  Lab Results:  Lab Results  Component Value Date   WBC 4.9 04/10/2023   HGB 13.5 04/10/2023   HCT 40.1 04/10/2023   MCV 98.5 04/10/2023   PLT 187 04/10/2023   NEUTROABS 2.3 04/10/2023    Imaging:  No results found.  Medications: I have reviewed the patient's current medications.  Assessment/Plan: Colon cancer, stage IIIb (pT4a, pN1a) Colonoscopy 10/10/2022 -circumferential fungating mass thought to represent the proximal descending colon at 70 cm from the anus that could not be passed with the scope.  Biopsy showed adenocarcinoma, at least intramucosal; intact expression of all 4 mismatch repair proteins.   CT scans 10/17/2022-2 noncalcified nodules adjacent to the horizontal fissure within the right thorax between the right upper lobe and right middle lobe measuring 11 and 7 mm in size; no other pulmonary nodules; 10 mm right paratracheal lymph node liver was negative for suspicious findings; spleen with 2 small densities; a constricting lesion of  the distal transverse colon suspected seen to the left of midline adjacent to the splenic flexure measuring 3.6 cm in length; significant narrowing of the lumen of the bowel at this location with bowel wall thickening; hazy stranding within the adjacent mesenteric fat; along the superior margin of the lesion and exophytic 12 mm density possibly representing local invasion and/or adjacent adenopathy.   CEA 10/17/2022 3.0 (normal range for non-smokers less than 3.0, smokers less than 5.0) Laparoscopic extended right hemicolectomy 11/05/2022 by Dr. Byrd Hesselbach.  Final pathology showed 6 cm invasive moderately differentiated adenocarcinoma in the distal transverse colon, invading the visceral peritoneum; resection margins negative; positive lymphovascular and perineural invasion; 1 out of 26 lymph nodes positive for carcinoma; no tumor deposits identified; appendiceal serrated lesion with low-grade dysplasia; pT4a, pN1a Cycle 1 FOLFOX 12/19/2022 Cycle 2 FOLFOX 01/02/2023 Cycle 3 FOLFOX 01/16/2023 Cycle 4 FOLFOX 01/30/2023 Cycle 5 FOLFOX 02/13/2023-5-FU leucovorin dose reduced secondary to diarrhea Cycle 6 FOLFOX 02/27/2023-5-FU bolus eliminated, 5-FU pump dose reduced due to diarrhea; oxaliplatin dose reduced due to thrombocytopenia Cycle 7 FOLFOX 03/13/2023-oxaliplatin held due to persistent cold sensitivity Cycle 8 FOLFOX 03/27/2023-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 Cycle 9 FOLFOX 04/10/2023-oxaliplatin held, 5-FU pump dose escalated   Iron deficiency anemia-he reports receiving IV iron prior to the colon surgery 12/18/2022 ferritin 11, hemoglobin 11.1/MCV 79 12/18/2022 ferrous sulfate 1 tablet daily Hypertension History of kidney stones Colonoscopy 10/10/2022-mass could not be passed with the scope Port-A-Cath placement, Interventional Radiology, 12/17/2022 Admission 02/18/2023 with abdominal pain and diarrhea CT abdomen/pelvis 02/18/2023-multiple renal calculi, mucosa enhancement in the sigmoid colon, "acute "thrombus  in the  SMV with no involvement of the portal or splenic vein 8.  SMV thrombus on CT 02/18/2023-heparin, transitioned to Xarelto    Disposition: [name] [name] appears stable.  He has completed 8 cycles of chemotherapy.  Oxaliplatin has been on hold due to persistent neuropathy symptoms.  Plan to continue to hold oxaliplatin.  5-FU pump will be dose escalated with today's treatment.    CBC and chemistry panel reviewed.  Labs adequate to proceed as above.  He will return for follow-up and treatment in 2 weeks.  He will contact the office in the interim with any problems.  We specifically discussed diarrhea.  Patient seen with Dr. Truett Perna.  Lonna Cobb ANP/GNP-BC   04/10/2023  9:10 AM  This was a shared visit with Lonna Cobb.  [name] [name] has tolerated the infusional 5-FU well.  He continues to have neuropathy symptoms.  The 5-FU pump will be dose escalated today.  He will call for diarrhea.  Mancel Bale, MD

## 2023-04-10 NOTE — Progress Notes (Signed)
Patient seen by Lonna Cobb NP today  Vitals are within treatment parameters:Yes   Labs are within treatment parameters: Yes   Treatment plan has been signed: Yes   Per physician team, Patient is ready for treatment. Please note the following modifications: The 5-FU pump will be dose escalated today.

## 2023-04-11 ENCOUNTER — Other Ambulatory Visit: Payer: Self-pay

## 2023-04-12 ENCOUNTER — Inpatient Hospital Stay: Payer: BC Managed Care – PPO

## 2023-04-12 VITALS — BP 148/97 | HR 88 | Temp 98.5°F | Resp 18

## 2023-04-12 DIAGNOSIS — C184 Malignant neoplasm of transverse colon: Secondary | ICD-10-CM

## 2023-04-12 DIAGNOSIS — Z5111 Encounter for antineoplastic chemotherapy: Secondary | ICD-10-CM | POA: Diagnosis not present

## 2023-04-12 MED ORDER — SODIUM CHLORIDE 0.9% FLUSH
10.0000 mL | INTRAVENOUS | Status: DC | PRN
  Administered 2023-04-12: 10 mL

## 2023-04-12 MED ORDER — HEPARIN SOD (PORK) LOCK FLUSH 100 UNIT/ML IV SOLN
500.0000 [IU] | Freq: Once | INTRAVENOUS | Status: AC | PRN
  Administered 2023-04-12: 500 [IU]

## 2023-04-12 NOTE — Patient Instructions (Signed)

## 2023-04-18 ENCOUNTER — Telehealth: Payer: Self-pay

## 2023-04-18 ENCOUNTER — Other Ambulatory Visit: Payer: Self-pay | Admitting: Nurse Practitioner

## 2023-04-18 DIAGNOSIS — C184 Malignant neoplasm of transverse colon: Secondary | ICD-10-CM

## 2023-04-18 MED ORDER — LORAZEPAM 0.5 MG PO TABS
0.5000 mg | ORAL_TABLET | Freq: Every evening | ORAL | 0 refills | Status: DC | PRN
Start: 2023-04-18 — End: 2023-05-22

## 2023-04-18 NOTE — Telephone Encounter (Signed)
The patient reached out to [place], stating that he has experienced difficulty sleeping for the past three weeks. He has tried several remedies, including Benadryl, melatonin, and various sleep aids, but has not found relief. He would like to know if the provider could prescribe a medication to assist with his sleep issues.

## 2023-04-21 ENCOUNTER — Other Ambulatory Visit: Payer: Self-pay | Admitting: Oncology

## 2023-04-21 DIAGNOSIS — C184 Malignant neoplasm of transverse colon: Secondary | ICD-10-CM

## 2023-04-24 ENCOUNTER — Inpatient Hospital Stay: Payer: BC Managed Care – PPO

## 2023-04-24 ENCOUNTER — Encounter: Payer: Self-pay | Admitting: Oncology

## 2023-04-24 ENCOUNTER — Inpatient Hospital Stay (HOSPITAL_BASED_OUTPATIENT_CLINIC_OR_DEPARTMENT_OTHER): Payer: BC Managed Care – PPO | Admitting: Oncology

## 2023-04-24 ENCOUNTER — Encounter: Payer: Self-pay | Admitting: *Deleted

## 2023-04-24 VITALS — BP 135/91 | HR 70 | Temp 98.2°F | Resp 18 | Ht 64.0 in | Wt 185.7 lb

## 2023-04-24 DIAGNOSIS — C184 Malignant neoplasm of transverse colon: Secondary | ICD-10-CM

## 2023-04-24 DIAGNOSIS — Z5111 Encounter for antineoplastic chemotherapy: Secondary | ICD-10-CM | POA: Diagnosis not present

## 2023-04-24 LAB — CMP (CANCER CENTER ONLY)
ALT: 28 U/L (ref 0–44)
AST: 28 U/L (ref 15–41)
Albumin: 4.3 g/dL (ref 3.5–5.0)
Alkaline Phosphatase: 64 U/L (ref 38–126)
Anion gap: 7 (ref 5–15)
BUN: 12 mg/dL (ref 8–23)
CO2: 26 mmol/L (ref 22–32)
Calcium: 9.6 mg/dL (ref 8.9–10.3)
Chloride: 104 mmol/L (ref 98–111)
Creatinine: 0.97 mg/dL (ref 0.61–1.24)
GFR, Estimated: >60 mL/min (ref >60)
Glucose, Bld: 158 mg/dL — ABNORMAL HIGH (ref 70–99)
Potassium: 4 mmol/L (ref 3.5–5.1)
Sodium: 137 mmol/L (ref 135–145)
Total Bilirubin: 0.5 mg/dL (ref 0.3–1.2)
Total Protein: 7 g/dL (ref 6.5–8.1)

## 2023-04-24 LAB — CBC WITH DIFFERENTIAL (CANCER CENTER ONLY)
Abs Immature Granulocytes: 0 10*3/uL (ref 0.00–0.07)
Basophils Absolute: 0 10*3/uL (ref 0.0–0.1)
Basophils Relative: 1 %
Eosinophils Absolute: 0.4 10*3/uL (ref 0.0–0.5)
Eosinophils Relative: 9 %
HCT: 39.7 % (ref 39.0–52.0)
Hemoglobin: 13.3 g/dL (ref 13.0–17.0)
Immature Granulocytes: 0 %
Lymphocytes Relative: 17 %
Lymphs Abs: 0.6 10*3/uL — ABNORMAL LOW (ref 0.7–4.0)
MCH: 33.9 pg (ref 26.0–34.0)
MCHC: 33.5 g/dL (ref 30.0–36.0)
MCV: 101.3 fL — ABNORMAL HIGH (ref 80.0–100.0)
Monocytes Absolute: 0.3 10*3/uL (ref 0.1–1.0)
Monocytes Relative: 8 %
Neutro Abs: 2.5 10*3/uL (ref 1.7–7.7)
Neutrophils Relative %: 65 %
Platelet Count: 180 10*3/uL (ref 150–400)
RBC: 3.92 MIL/uL — ABNORMAL LOW (ref 4.22–5.81)
RDW: 15.3 % (ref 11.5–15.5)
WBC Count: 3.8 10*3/uL — ABNORMAL LOW (ref 4.0–10.5)
nRBC: 0 % (ref 0.0–0.2)

## 2023-04-24 MED ORDER — SODIUM CHLORIDE 0.9% FLUSH
10.0000 mL | INTRAVENOUS | Status: DC | PRN
Start: 2023-04-24 — End: 2023-04-24
  Administered 2023-04-24: 10 mL

## 2023-04-24 MED ORDER — SODIUM CHLORIDE 0.9 % IV SOLN
1000.0000 mg/m2 | INTRAVENOUS | Status: DC
  Administered 2023-04-24: 2000 mg via INTRAVENOUS
  Filled 2023-04-24: qty 40

## 2023-04-24 NOTE — Progress Notes (Signed)
Patient seen by Dr. Thornton Papas today  Vitals are within treatment parameters:Yes OK to proceed w/BP 135/91  Labs are within treatment parameters: Yes   Treatment plan has been signed: Yes   Per physician team, Patient is ready for treatment and there are NO modifications to the treatment plan.

## 2023-04-24 NOTE — Patient Instructions (Signed)
[place] [facility] AT [facility]  Discharge Instructions: Thank you for choosing [facility] to provide your oncology and hematology care.   If you have a lab appointment with the Cancer Center, please go directly to the Cancer Center and check in at the registration area.   Wear comfortable clothing and clothing appropriate for easy access to any Portacath or PICC line.   We strive to give you quality time with your provider. You may need to reschedule your appointment if you arrive late (15 or more minutes).  Arriving late affects you and other patients whose appointments are after yours.  Also, if you miss three or more appointments without notifying the office, you may be dismissed from the clinic at the provider's discretion.      For prescription refill requests, have your pharmacy contact our office and allow 72 hours for refills to be completed.    Today you received the following chemotherapy and/or immunotherapy agents: Fluorouracil/5FU.      To help prevent nausea and vomiting after your treatment, we encourage you to take your nausea medication as directed.  BELOW ARE SYMPTOMS THAT SHOULD BE REPORTED IMMEDIATELY: *FEVER GREATER THAN 100.4 F (38 C) OR HIGHER *CHILLS OR SWEATING *NAUSEA AND VOMITING THAT IS NOT CONTROLLED WITH YOUR NAUSEA MEDICATION *UNUSUAL SHORTNESS OF BREATH *UNUSUAL BRUISING OR BLEEDING *URINARY PROBLEMS (pain or burning when urinating, or frequent urination) *BOWEL PROBLEMS (unusual diarrhea, constipation, pain near the anus) TENDERNESS IN MOUTH AND THROAT WITH OR WITHOUT PRESENCE OF ULCERS (sore throat, sores in mouth, or a toothache) UNUSUAL RASH, SWELLING OR PAIN  UNUSUAL VAGINAL DISCHARGE OR ITCHING   Items with * indicate a potential emergency and should be followed up as soon as possible or go to the Emergency Department if any problems should occur.  Please show the CHEMOTHERAPY ALERT CARD or IMMUNOTHERAPY ALERT CARD at  check-in to the Emergency Department and triage nurse.  Should you have questions after your visit or need to cancel or reschedule your appointment, please contact [place] [facility] AT [facility]  Dept: [phone]  and follow the prompts.  Office hours are 8:00 a.m. to 4:30 p.m. Monday - Friday. Please note that voicemails left after 4:00 p.m. may not be returned until the following business day.  We are closed weekends and major holidays. You have access to a nurse at all times for urgent questions. Please call the main number to the clinic Dept: [phone] and follow the prompts.   For any non-urgent questions, you may also contact your provider using MyChart. We now offer e-Visits for anyone 61 and older to request care online for non-urgent symptoms. For details visit [email or web address].   Also download the MyChart app! Go to the app store, search "MyChart", open the app, select , and log in with your MyChart username and password.

## 2023-04-24 NOTE — Progress Notes (Signed)
[facility] OFFICE PROGRESS NOTE   Diagnosis: Colon cancer  INTERVAL HISTORY:   [name] [name] completed another cycle of 5-FU on 04/10/2023.  He reports diarrhea beginning on day 5 and lasting for several days.  He had diarrhea 5-6 times per day.  The diarrhea improved with Imodium and Lomotil.  No mouth sores or nausea.  He reports numbness at the distal fingers of the left hand.  No weakness.  He reports chronic "stiffness "in the neck and intermittent numbness of the left arm after sleeping.  This resolves with movement, but the left finger numbness persists.  No right finger numbness.  Objective:  Vital signs in last 24 hours:  Blood pressure (!) 135/91, pulse 70, temperature 98.2 F (36.8 C), temperature source Temporal, resp. rate 18, height 5\' 4"  (1.626 m), weight 185 lb 11.2 oz (84.2 kg), SpO2 98%.    HEENT: No thrush or ulcers Resp: Lungs clear bilaterally Cardio: Regular rate and rhythm GI: Nontender, no hepatosplenomegaly Vascular: No leg edema Neuro: The hand strength appears intact bilaterally    Portacath/PICC-without erythema  Lab Results:  Lab Results  Component Value Date   WBC 3.8 (L) 04/24/2023   HGB 13.3 04/24/2023   HCT 39.7 04/24/2023   MCV 101.3 (H) 04/24/2023   PLT 180 04/24/2023   NEUTROABS 2.5 04/24/2023    CMP  Lab Results  Component Value Date   NA 137 04/24/2023   K 4.0 04/24/2023   CL 104 04/24/2023   CO2 26 04/24/2023   GLUCOSE 158 (H) 04/24/2023   BUN 12 04/24/2023   CREATININE 0.97 04/24/2023   CALCIUM 9.6 04/24/2023   PROT 7.0 04/24/2023   ALBUMIN 4.3 04/24/2023   AST 28 04/24/2023   ALT 28 04/24/2023   ALKPHOS 64 04/24/2023   BILITOT 0.5 04/24/2023   GFRNONAA >60 04/24/2023   GFRAA >60 04/13/2018    Lab Results  Component Value Date   CEA 1.63 12/18/2022      Medications: I have reviewed the patient's current medications.   Assessment/Plan: Colon cancer, stage IIIb (pT4a, pN1a) Colonoscopy 10/10/2022  -circumferential fungating mass thought to represent the proximal descending colon at 70 cm from the anus that could not be passed with the scope.  Biopsy showed adenocarcinoma, at least intramucosal; intact expression of all 4 mismatch repair proteins.   CT scans 10/17/2022-2 noncalcified nodules adjacent to the horizontal fissure within the right thorax between the right upper lobe and right middle lobe measuring 11 and 7 mm in size; no other pulmonary nodules; 10 mm right paratracheal lymph node liver was negative for suspicious findings; spleen with 2 small densities; a constricting lesion of the distal transverse colon suspected seen to the left of midline adjacent to the splenic flexure measuring 3.6 cm in length; significant narrowing of the lumen of the bowel at this location with bowel wall thickening; hazy stranding within the adjacent mesenteric fat; along the superior margin of the lesion and exophytic 12 mm density possibly representing local invasion and/or adjacent adenopathy.   CEA 10/17/2022 3.0 (normal range for non-smokers less than 3.0, smokers less than 5.0) Laparoscopic extended right hemicolectomy 11/05/2022 by Dr. Byrd Hesselbach.  Final pathology showed 6 cm invasive moderately differentiated adenocarcinoma in the distal transverse colon, invading the visceral peritoneum; resection margins negative; positive lymphovascular and perineural invasion; 1 out of 26 lymph nodes positive for carcinoma; no tumor deposits identified; appendiceal serrated lesion with low-grade dysplasia; pT4a, pN1a Cycle 1 FOLFOX 12/19/2022 Cycle 2 FOLFOX 01/02/2023 Cycle 3  FOLFOX 01/16/2023 Cycle 4 FOLFOX 01/30/2023 Cycle 5 FOLFOX 02/13/2023-5-FU leucovorin dose reduced secondary to diarrhea Cycle 6 FOLFOX 02/27/2023-5-FU bolus eliminated, 5-FU pump dose reduced due to diarrhea; oxaliplatin dose reduced due to thrombocytopenia Cycle 7 FOLFOX 03/13/2023-oxaliplatin held due to persistent cold sensitivity Cycle 8 FOLFOX  03/27/2023-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 Cycle 9 FOLFOX 04/10/2023-oxaliplatin held, 5-FU pump dose escalated Cycle 10 FOLFOX 04/24/2023-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   Iron deficiency anemia-he reports receiving IV iron prior to the colon surgery 12/18/2022 ferritin 11, hemoglobin 11.1/MCV 79 12/18/2022 ferrous sulfate 1 tablet daily Hypertension History of kidney stones Colonoscopy 10/10/2022-mass could not be passed with the scope Port-A-Cath placement, Interventional Radiology, 12/17/2022 Admission 02/18/2023 with abdominal pain and diarrhea CT abdomen/pelvis 02/18/2023-multiple renal calculi, mucosa enhancement in the sigmoid colon, "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  SMV thrombus on CT 02/18/2023-heparin, transitioned to Xarelto      Disposition: [name] [name] tolerated the cycle of 5-fluorouracil well aside from diarrhea.  The diarrhea was controlled with Imodium and Lomotil.  I decided against further dose escalation of 5-FU secondary to the diarrhea.  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  He will call for weakness of the left arm or hand.  Oxaliplatin will remain on hold.  He completed 6 cycles of chemotherapy with oxaliplatin.  [name] [name] will complete another cycle of infusional 5-fluorouracil today.  He will return for an office visit and chemotherapy in 2 weeks.  Thornton Papas, MD  04/24/2023  9:24 AM

## 2023-04-24 NOTE — Patient Instructions (Signed)

## 2023-04-26 ENCOUNTER — Inpatient Hospital Stay: Payer: BC Managed Care – PPO

## 2023-04-26 VITALS — BP 149/86 | HR 75 | Temp 98.1°F | Resp 18

## 2023-04-26 DIAGNOSIS — Z5111 Encounter for antineoplastic chemotherapy: Secondary | ICD-10-CM | POA: Diagnosis not present

## 2023-04-26 DIAGNOSIS — C184 Malignant neoplasm of transverse colon: Secondary | ICD-10-CM

## 2023-04-26 MED ORDER — SODIUM CHLORIDE 0.9% FLUSH
10.0000 mL | INTRAVENOUS | Status: DC | PRN
Start: 2023-04-26 — End: 2023-04-26
  Administered 2023-04-26: 10 mL

## 2023-04-26 MED ORDER — HEPARIN SOD (PORK) LOCK FLUSH 100 UNIT/ML IV SOLN
500.0000 [IU] | Freq: Once | INTRAVENOUS | Status: AC | PRN
  Administered 2023-04-26: 500 [IU]

## 2023-04-26 NOTE — Patient Instructions (Signed)

## 2023-05-07 ENCOUNTER — Inpatient Hospital Stay: Payer: BC Managed Care – PPO

## 2023-05-07 ENCOUNTER — Inpatient Hospital Stay (HOSPITAL_BASED_OUTPATIENT_CLINIC_OR_DEPARTMENT_OTHER): Payer: BC Managed Care – PPO | Admitting: Nurse Practitioner

## 2023-05-07 ENCOUNTER — Encounter: Payer: Self-pay | Admitting: Nurse Practitioner

## 2023-05-07 ENCOUNTER — Inpatient Hospital Stay: Payer: BC Managed Care – PPO | Attending: Nurse Practitioner

## 2023-05-07 VITALS — BP 131/90 | HR 69 | Temp 98.2°F | Resp 18 | Ht 64.0 in | Wt 188.8 lb

## 2023-05-07 DIAGNOSIS — I10 Essential (primary) hypertension: Secondary | ICD-10-CM | POA: Diagnosis not present

## 2023-05-07 DIAGNOSIS — C184 Malignant neoplasm of transverse colon: Secondary | ICD-10-CM | POA: Diagnosis present

## 2023-05-07 DIAGNOSIS — Z87442 Personal history of urinary calculi: Secondary | ICD-10-CM | POA: Insufficient documentation

## 2023-05-07 DIAGNOSIS — R197 Diarrhea, unspecified: Secondary | ICD-10-CM | POA: Diagnosis not present

## 2023-05-07 DIAGNOSIS — F17200 Nicotine dependence, unspecified, uncomplicated: Secondary | ICD-10-CM | POA: Insufficient documentation

## 2023-05-07 DIAGNOSIS — D696 Thrombocytopenia, unspecified: Secondary | ICD-10-CM | POA: Diagnosis not present

## 2023-05-07 DIAGNOSIS — D509 Iron deficiency anemia, unspecified: Secondary | ICD-10-CM | POA: Insufficient documentation

## 2023-05-07 DIAGNOSIS — G629 Polyneuropathy, unspecified: Secondary | ICD-10-CM | POA: Insufficient documentation

## 2023-05-07 DIAGNOSIS — R21 Rash and other nonspecific skin eruption: Secondary | ICD-10-CM | POA: Diagnosis not present

## 2023-05-07 DIAGNOSIS — Z95828 Presence of other vascular implants and grafts: Secondary | ICD-10-CM

## 2023-05-07 DIAGNOSIS — Z79899 Other long term (current) drug therapy: Secondary | ICD-10-CM | POA: Insufficient documentation

## 2023-05-07 DIAGNOSIS — Z5111 Encounter for antineoplastic chemotherapy: Secondary | ICD-10-CM | POA: Diagnosis present

## 2023-05-07 LAB — CBC WITH DIFFERENTIAL (CANCER CENTER ONLY)
Abs Immature Granulocytes: 0.02 10*3/uL (ref 0.00–0.07)
Basophils Absolute: 0.1 10*3/uL (ref 0.0–0.1)
Basophils Relative: 1 %
Eosinophils Absolute: 0.6 10*3/uL — ABNORMAL HIGH (ref 0.0–0.5)
Eosinophils Relative: 9 %
HCT: 39.7 % (ref 39.0–52.0)
Hemoglobin: 13.3 g/dL (ref 13.0–17.0)
Immature Granulocytes: 0 %
Lymphocytes Relative: 26 %
Lymphs Abs: 1.6 10*3/uL (ref 0.7–4.0)
MCH: 33.8 pg (ref 26.0–34.0)
MCHC: 33.5 g/dL (ref 30.0–36.0)
MCV: 100.8 fL — ABNORMAL HIGH (ref 80.0–100.0)
Monocytes Absolute: 0.5 10*3/uL (ref 0.1–1.0)
Monocytes Relative: 8 %
Neutro Abs: 3.5 10*3/uL (ref 1.7–7.7)
Neutrophils Relative %: 56 %
Platelet Count: 200 10*3/uL (ref 150–400)
RBC: 3.94 MIL/uL — ABNORMAL LOW (ref 4.22–5.81)
RDW: 15.2 % (ref 11.5–15.5)
WBC Count: 6.2 10*3/uL (ref 4.0–10.5)
nRBC: 0 % (ref 0.0–0.2)

## 2023-05-07 LAB — CMP (CANCER CENTER ONLY)
ALT: 37 U/L (ref 0–44)
AST: 32 U/L (ref 15–41)
Albumin: 4.2 g/dL (ref 3.5–5.0)
Alkaline Phosphatase: 64 U/L (ref 38–126)
Anion gap: 9 (ref 5–15)
BUN: 9 mg/dL (ref 8–23)
CO2: 27 mmol/L (ref 22–32)
Calcium: 10 mg/dL (ref 8.9–10.3)
Chloride: 102 mmol/L (ref 98–111)
Creatinine: 1.08 mg/dL (ref 0.61–1.24)
GFR, Estimated: >60 mL/min (ref >60)
Glucose, Bld: 123 mg/dL — ABNORMAL HIGH (ref 70–99)
Potassium: 3.6 mmol/L (ref 3.5–5.1)
Sodium: 138 mmol/L (ref 135–145)
Total Bilirubin: 0.6 mg/dL (ref <1.2)
Total Protein: 6.8 g/dL (ref 6.5–8.1)

## 2023-05-07 MED ORDER — SODIUM CHLORIDE 0.9% FLUSH
10.0000 mL | INTRAVENOUS | Status: DC | PRN
Start: 2023-05-07 — End: 2023-05-07
  Administered 2023-05-07: 10 mL

## 2023-05-07 MED ORDER — SODIUM CHLORIDE 0.9% FLUSH
10.0000 mL | Freq: Once | INTRAVENOUS | Status: AC
  Administered 2023-05-07: 10 mL via INTRAVENOUS

## 2023-05-07 MED ORDER — HEPARIN SOD (PORK) LOCK FLUSH 100 UNIT/ML IV SOLN
500.0000 [IU] | Freq: Once | INTRAVENOUS | Status: AC
  Administered 2023-05-07: 500 [IU] via INTRAVENOUS

## 2023-05-07 MED ORDER — SODIUM CHLORIDE 0.9 % IV SOLN
1000.0000 mg/m2 | INTRAVENOUS | Status: DC
  Administered 2023-05-07: 2000 mg via INTRAVENOUS
  Filled 2023-05-07: qty 40

## 2023-05-07 NOTE — Patient Instructions (Signed)

## 2023-05-07 NOTE — Progress Notes (Unsigned)
[facility] OFFICE PROGRESS NOTE   Diagnosis: Colon cancer  INTERVAL HISTORY:   [name] [name] returns as scheduled.  He completed another cycle of 5-fluorouracil 04/24/2023.  He denies nausea/vomiting.  No mouth sores.  No significant diarrhea.  A few days after having the pump removed he woke up with a pruritic rash on the lower legs and lower inner arms.  No involvement in the eyes, mouth, palms or soles.  He denies significant sun exposure.  He uses a riding mower, does not recall being in any tall grass.  He feels the rash is improving.  Objective:  Vital signs in last 24 hours:  Blood pressure (!) 131/90, pulse 69, temperature 98.2 F (36.8 C), temperature source Temporal, resp. rate 18, height 5\' 4"  (1.626 m), weight 188 lb 12.8 oz (85.6 kg), SpO2 98%.    HEENT: No thrush or ulcers.  No conjunctival erythema. Resp: Lungs clear bilaterally. Cardio: Regular rate and rhythm. GI: No hepatosplenomegaly Vascular: No leg edema Skin: Palms without erythema.  Beginning at the bilateral elbow and extending along the inner arm bilaterally there are multiple erythematous dry appearing skin lesions.  Similar type lesions beginning at the knees and extending to the dorsum of the feet.  Palms and soles are spared.  No lesions on the trunk. Port-A-Cath without erythema.  Lab Results:  Lab Results  Component Value Date   WBC 6.2 05/07/2023   HGB 13.3 05/07/2023   HCT 39.7 05/07/2023   MCV 100.8 (H) 05/07/2023   PLT 200 05/07/2023   NEUTROABS 3.5 05/07/2023    Imaging:  No results found.  Medications: I have reviewed the patient's current medications.  Assessment/Plan: Colon cancer, stage IIIb (pT4a, pN1a) Colonoscopy 10/10/2022 -circumferential fungating mass thought to represent the proximal descending colon at 70 cm from the anus that could not be passed with the scope.  Biopsy showed adenocarcinoma, at least intramucosal; intact expression of all 4 mismatch repair  proteins.   CT scans 10/17/2022-2 noncalcified nodules adjacent to the horizontal fissure within the right thorax between the right upper lobe and right middle lobe measuring 11 and 7 mm in size; no other pulmonary nodules; 10 mm right paratracheal lymph node liver was negative for suspicious findings; spleen with 2 small densities; a constricting lesion of the distal transverse colon suspected seen to the left of midline adjacent to the splenic flexure measuring 3.6 cm in length; significant narrowing of the lumen of the bowel at this location with bowel wall thickening; hazy stranding within the adjacent mesenteric fat; along the superior margin of the lesion and exophytic 12 mm density possibly representing local invasion and/or adjacent adenopathy.   CEA 10/17/2022 3.0 (normal range for non-smokers less than 3.0, smokers less than 5.0) Laparoscopic extended right hemicolectomy 11/05/2022 by Dr. Byrd Hesselbach.  Final pathology showed 6 cm invasive moderately differentiated adenocarcinoma in the distal transverse colon, invading the visceral peritoneum; resection margins negative; positive lymphovascular and perineural invasion; 1 out of 26 lymph nodes positive for carcinoma; no tumor deposits identified; appendiceal serrated lesion with low-grade dysplasia; pT4a, pN1a Cycle 1 FOLFOX 12/19/2022 Cycle 2 FOLFOX 01/02/2023 Cycle 3 FOLFOX 01/16/2023 Cycle 4 FOLFOX 01/30/2023 Cycle 5 FOLFOX 02/13/2023-5-FU leucovorin dose reduced secondary to diarrhea Cycle 6 FOLFOX 02/27/2023-5-FU bolus eliminated, 5-FU pump dose reduced due to diarrhea; oxaliplatin dose reduced due to thrombocytopenia Cycle 7 FOLFOX 03/13/2023-oxaliplatin held due to persistent cold sensitivity Cycle 8 FOLFOX 03/27/2023-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 Cycle 9 FOLFOX 04/10/2023-oxaliplatin held, 5-FU pump dose escalated Cycle  10 FOLFOX 04/24/2023-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 Cycle 11 FOLFOX 05/07/2023-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   Iron  deficiency anemia-he reports receiving IV iron prior to the colon surgery 12/18/2022 ferritin 11, hemoglobin 11.1/MCV 79 12/18/2022 ferrous sulfate 1 tablet daily Hypertension History of kidney stones Colonoscopy 10/10/2022-mass could not be passed with the scope Port-A-Cath placement, Interventional Radiology, 12/17/2022 Admission 02/18/2023 with abdominal pain and diarrhea CT abdomen/pelvis 02/18/2023-multiple renal calculi, mucosa enhancement in the sigmoid colon, "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  SMV thrombus on CT 02/18/2023-heparin, transitioned to Xarelto      Disposition: [name] [name] appears stable.  He has completed 10 cycles of systemic therapy, cycles 1-6 with oxaliplatin and cycles 7 through 10 with 5-fluorouracil alone.  Oxaliplatin will remain on hold.  Plan to proceed with cycle eleven 5-fluorouracil today as scheduled.  CBC and chemistry panel reviewed.  Labs adequate to proceed as above.  Etiology of the rash on his arms and legs is unclear.  Appearance and distribution not typical of a 5-fluorouracil related rash but it is possible.  Question contact dermatitis or insect bites.  The rash appears to be resolving.  He will contact the office with any increase in symptoms following treatment today.  He will return for follow-up and treatment as scheduled in 2 weeks.  We are available to see him sooner if needed.  Patient seen with Dr. Truett Perna.    Lonna Cobb ANP/GNP-BC   05/07/2023  1:39 PM  This was a shared visit with Lonna Cobb.  [name] [name] was interviewed and examined.  He developed a rash following the most recent cycle of chemotherapy.  The rash does not have the typical appearance of a 5-fluorouracil rash.  The rash is in a contact distribution.  We suspect the skin rash/lesions may be related to insect bites or contact exposure.  The plan is to proceed with a cycle of 5-fluorouracil today.  He will call for worsening of the  rash.  I was present for greater than 50% of today's visit.  I performed medical decision making.  Mancel Bale, MD

## 2023-05-07 NOTE — Progress Notes (Unsigned)
Patient seen by Lonna Cobb NP today  Vitals are within treatment parameters:Yes   Labs are within treatment parameters: Yes   Treatment plan has been signed: Yes   Per physician team, Patient is ready for treatment and there are NO modifications to the treatment plan. Pump only

## 2023-05-07 NOTE — Patient Instructions (Signed)
[place] [facility] - A DEPT OF [facility]  Discharge Instructions: Thank you for choosing [facility] to provide your oncology and hematology care.   If you have a lab appointment with the Cancer Center, please go directly to the Cancer Center and check in at the registration area.   Wear comfortable clothing and clothing appropriate for easy access to any Portacath or PICC line.   We strive to give you quality time with your provider. You may need to reschedule your appointment if you arrive late (15 or more minutes).  Arriving late affects you and other patients whose appointments are after yours.  Also, if you miss three or more appointments without notifying the office, you may be dismissed from the clinic at the provider's discretion.      For prescription refill requests, have your pharmacy contact our office and allow 72 hours for refills to be completed.    Today you received the following chemotherapy and/or immunotherapy agents: Fluorouracil/5FU.      To help prevent nausea and vomiting after your treatment, we encourage you to take your nausea medication as directed.  BELOW ARE SYMPTOMS THAT SHOULD BE REPORTED IMMEDIATELY: *FEVER GREATER THAN 100.4 F (38 C) OR HIGHER *CHILLS OR SWEATING *NAUSEA AND VOMITING THAT IS NOT CONTROLLED WITH YOUR NAUSEA MEDICATION *UNUSUAL SHORTNESS OF BREATH *UNUSUAL BRUISING OR BLEEDING *URINARY PROBLEMS (pain or burning when urinating, or frequent urination) *BOWEL PROBLEMS (unusual diarrhea, constipation, pain near the anus) TENDERNESS IN MOUTH AND THROAT WITH OR WITHOUT PRESENCE OF ULCERS (sore throat, sores in mouth, or a toothache) UNUSUAL RASH, SWELLING OR PAIN  UNUSUAL VAGINAL DISCHARGE OR ITCHING   Items with * indicate a potential emergency and should be followed up as soon as possible or go to the Emergency Department if any problems should occur.  Please show the CHEMOTHERAPY ALERT CARD or  IMMUNOTHERAPY ALERT CARD at check-in to the Emergency Department and triage nurse.  Should you have questions after your visit or need to cancel or reschedule your appointment, please contact [place] [facility] - A DEPT [facility]  Dept: [phone]  and follow the prompts.  Office hours are 8:00 a.m. to 4:30 p.m. Monday - Friday. Please note that voicemails left after 4:00 p.m. may not be returned until the following business day.  We are closed weekends and major holidays. You have access to a nurse at all times for urgent questions. Please call the main number to the clinic Dept: [phone] and follow the prompts.   For any non-urgent questions, you may also contact your provider using MyChart. We now offer e-Visits for anyone 8 and older to request care online for non-urgent symptoms. For details visit [email or web address].   Also download the MyChart app! Go to the app store, search "MyChart", open the app, select [place], and log in with your MyChart username and password.

## 2023-05-08 ENCOUNTER — Ambulatory Visit: Payer: BC Managed Care – PPO

## 2023-05-08 ENCOUNTER — Encounter: Payer: Self-pay | Admitting: Oncology

## 2023-05-09 ENCOUNTER — Inpatient Hospital Stay: Payer: BC Managed Care – PPO

## 2023-05-09 ENCOUNTER — Other Ambulatory Visit: Payer: Self-pay | Admitting: Nurse Practitioner

## 2023-05-09 VITALS — BP 141/98 | HR 70 | Temp 98.5°F | Resp 18

## 2023-05-09 DIAGNOSIS — C184 Malignant neoplasm of transverse colon: Secondary | ICD-10-CM

## 2023-05-09 DIAGNOSIS — R21 Rash and other nonspecific skin eruption: Secondary | ICD-10-CM

## 2023-05-09 DIAGNOSIS — Z5111 Encounter for antineoplastic chemotherapy: Secondary | ICD-10-CM | POA: Diagnosis not present

## 2023-05-09 MED ORDER — METHYLPREDNISOLONE 4 MG PO TBPK: ORAL_TABLET | ORAL | 0 refills | Status: DC

## 2023-05-09 MED ORDER — HEPARIN SOD (PORK) LOCK FLUSH 100 UNIT/ML IV SOLN
500.0000 [IU] | Freq: Once | INTRAVENOUS | Status: AC | PRN
  Administered 2023-05-09: 500 [IU]

## 2023-05-09 MED ORDER — SODIUM CHLORIDE 0.9% FLUSH
10.0000 mL | INTRAVENOUS | Status: DC | PRN
Start: 2023-05-09 — End: 2023-05-09
  Administered 2023-05-09: 10 mL

## 2023-05-09 NOTE — Progress Notes (Signed)
Patient in for 5FU Pump stop today. He had reported red spots and itchiness on bilateral lower legs after last treatment. Today he reported redness and itchiness on bilateral upper legs and forearms. He has been using utterly smooth lotion as advised at last office visit. Lonna Cobb, NP and Dr. Thornton Papas made aware. Lonna Cobb, NP came to infusion to assess patient.

## 2023-05-09 NOTE — Patient Instructions (Signed)

## 2023-05-10 ENCOUNTER — Inpatient Hospital Stay: Payer: BC Managed Care – PPO

## 2023-05-19 ENCOUNTER — Other Ambulatory Visit: Payer: Self-pay | Admitting: Oncology

## 2023-05-22 ENCOUNTER — Inpatient Hospital Stay (HOSPITAL_BASED_OUTPATIENT_CLINIC_OR_DEPARTMENT_OTHER): Payer: BC Managed Care – PPO | Admitting: Oncology

## 2023-05-22 ENCOUNTER — Inpatient Hospital Stay: Payer: BC Managed Care – PPO

## 2023-05-22 VITALS — BP 168/97 | HR 100 | Temp 97.9°F | Resp 18 | Ht 64.0 in | Wt 183.4 lb

## 2023-05-22 DIAGNOSIS — C184 Malignant neoplasm of transverse colon: Secondary | ICD-10-CM

## 2023-05-22 DIAGNOSIS — Z5111 Encounter for antineoplastic chemotherapy: Secondary | ICD-10-CM | POA: Diagnosis not present

## 2023-05-22 DIAGNOSIS — Z95828 Presence of other vascular implants and grafts: Secondary | ICD-10-CM

## 2023-05-22 LAB — CMP (CANCER CENTER ONLY)
ALT: 40 U/L (ref 0–44)
AST: 38 U/L (ref 15–41)
Albumin: 4.4 g/dL (ref 3.5–5.0)
Alkaline Phosphatase: 59 U/L (ref 38–126)
Anion gap: 13 (ref 5–15)
BUN: 10 mg/dL (ref 8–23)
CO2: 24 mmol/L (ref 22–32)
Calcium: 8.9 mg/dL (ref 8.9–10.3)
Chloride: 103 mmol/L (ref 98–111)
Creatinine: 1.07 mg/dL (ref 0.61–1.24)
GFR, Estimated: >60 mL/min (ref >60)
Glucose, Bld: 143 mg/dL — ABNORMAL HIGH (ref 70–99)
Potassium: 3.4 mmol/L — ABNORMAL LOW (ref 3.5–5.1)
Sodium: 140 mmol/L (ref 135–145)
Total Bilirubin: 1.3 mg/dL — ABNORMAL HIGH (ref <1.2)
Total Protein: 7.3 g/dL (ref 6.5–8.1)

## 2023-05-22 LAB — CBC WITH DIFFERENTIAL (CANCER CENTER ONLY)
Abs Immature Granulocytes: 0.01 10*3/uL (ref 0.00–0.07)
Basophils Absolute: 0 10*3/uL (ref 0.0–0.1)
Basophils Relative: 1 %
Eosinophils Absolute: 0.1 10*3/uL (ref 0.0–0.5)
Eosinophils Relative: 2 %
HCT: 44.8 % (ref 39.0–52.0)
Hemoglobin: 14.9 g/dL (ref 13.0–17.0)
Immature Granulocytes: 0 %
Lymphocytes Relative: 18 %
Lymphs Abs: 1.1 10*3/uL (ref 0.7–4.0)
MCH: 33.1 pg (ref 26.0–34.0)
MCHC: 33.3 g/dL (ref 30.0–36.0)
MCV: 99.6 fL (ref 80.0–100.0)
Monocytes Absolute: 0.4 10*3/uL (ref 0.1–1.0)
Monocytes Relative: 7 %
Neutro Abs: 4.2 10*3/uL (ref 1.7–7.7)
Neutrophils Relative %: 72 %
Platelet Count: 168 10*3/uL (ref 150–400)
RBC: 4.5 MIL/uL (ref 4.22–5.81)
RDW: 15.1 % (ref 11.5–15.5)
WBC Count: 5.9 10*3/uL (ref 4.0–10.5)
nRBC: 0 % (ref 0.0–0.2)

## 2023-05-22 LAB — CEA (ACCESS): CEA (CHCC): 2.07 ng/mL (ref 0.00–5.00)

## 2023-05-22 MED ORDER — LORAZEPAM 0.5 MG PO TABS: 0.5000 mg | ORAL_TABLET | Freq: Every evening | ORAL | 0 refills | Status: DC | PRN

## 2023-05-22 MED ORDER — SODIUM CHLORIDE 0.9% FLUSH
10.0000 mL | INTRAVENOUS | Status: AC | PRN
Start: 2023-05-22 — End: ?
  Administered 2023-05-22: 10 mL via INTRAVENOUS

## 2023-05-22 MED ORDER — HEPARIN SOD (PORK) LOCK FLUSH 100 UNIT/ML IV SOLN
500.0000 [IU] | Freq: Once | INTRAVENOUS | Status: AC
  Administered 2023-05-22: 500 [IU] via INTRAVENOUS

## 2023-05-22 NOTE — Addendum Note (Signed)
Addended by: Dimitri Ped on: 05/22/2023 09:16 AM   Modules accepted: Orders

## 2023-05-22 NOTE — Patient Instructions (Signed)

## 2023-05-22 NOTE — Progress Notes (Signed)
[facility] OFFICE PROGRESS NOTE   Diagnosis: Colon cancer  INTERVAL HISTORY:   Mr. Mayoral returns as scheduled.  He completed a cycle of 5-FU beginning 05/07/2023.  He developed a recurrent erythematous and pruritic rash over the arms/hands and legs/feet beginning on day 3.  He completed a Medrol Dosepak.  The rash is now resolving. No diarrhea.  He feels well. Objective:  Vital signs in last 24 hours:  Blood pressure (!) 153/102, pulse 100, temperature 97.9 F (36.6 C), temperature source Temporal, resp. rate 18, height 5\' 4"  (1.626 m), weight 183 lb 6.4 oz (83.2 kg), SpO2 98%.    HEENT: No thrush or ulcers Lymphatics: No cervical, supraclavicular, axillary, or inguinal nodes Resp: Lungs clear bilaterally Cardio: Regular rate and rhythm GI: No hepatosplenomegaly, no mass Vascular: No leg edema  Skin: Fading erythematous rash over the lower arms, lower legs, hands, and feet.  Dryness and callus formation at the palms and soles  Portacath/PICC-without erythema  Lab Results:  Lab Results  Component Value Date   WBC 5.9 05/22/2023   HGB 14.9 05/22/2023   HCT 44.8 05/22/2023   MCV 99.6 05/22/2023   PLT 168 05/22/2023   NEUTROABS 4.2 05/22/2023    CMP  Lab Results  Component Value Date   NA 138 05/07/2023   K 3.6 05/07/2023   CL 102 05/07/2023   CO2 27 05/07/2023   GLUCOSE 123 (H) 05/07/2023   BUN 9 05/07/2023   CREATININE 1.08 05/07/2023   CALCIUM 10.0 05/07/2023   PROT 6.8 05/07/2023   ALBUMIN 4.2 05/07/2023   AST 32 05/07/2023   ALT 37 05/07/2023   ALKPHOS 64 05/07/2023   BILITOT 0.6 05/07/2023   GFRNONAA >60 05/07/2023   GFRAA >60 04/13/2018    Lab Results  Component Value Date   CEA 1.63 12/18/2022    Medications: I have reviewed the patient's current medications.   Assessment/Plan: Colon cancer, stage IIIb (pT4a, pN1a) Colonoscopy 10/10/2022 -circumferential fungating mass thought to represent the proximal descending colon at 70 cm  from the anus that could not be passed with the scope.  Biopsy showed adenocarcinoma, at least intramucosal; intact expression of all 4 mismatch repair proteins.   CT scans 10/17/2022-2 noncalcified nodules adjacent to the horizontal fissure within the right thorax between the right upper lobe and right middle lobe measuring 11 and 7 mm in size; no other pulmonary nodules; 10 mm right paratracheal lymph node liver was negative for suspicious findings; spleen with 2 small densities; a constricting lesion of the distal transverse colon suspected seen to the left of midline adjacent to the splenic flexure measuring 3.6 cm in length; significant narrowing of the lumen of the bowel at this location with bowel wall thickening; hazy stranding within the adjacent mesenteric fat; along the superior margin of the lesion and exophytic 12 mm density possibly representing local invasion and/or adjacent adenopathy.   CEA 10/17/2022 3.0 (normal range for non-smokers less than 3.0, smokers less than 5.0) Laparoscopic extended right hemicolectomy 11/05/2022 by Dr. Byrd Hesselbach.  Final pathology showed 6 cm invasive moderately differentiated adenocarcinoma in the distal transverse colon, invading the visceral peritoneum; resection margins negative; positive lymphovascular and perineural invasion; 1 out of 26 lymph nodes positive for carcinoma; no tumor deposits identified; appendiceal serrated lesion with low-grade dysplasia; pT4a, pN1a Cycle 1 FOLFOX 12/19/2022 Cycle 2 FOLFOX 01/02/2023 Cycle 3 FOLFOX 01/16/2023 Cycle 4 FOLFOX 01/30/2023 Cycle 5 FOLFOX 02/13/2023-5-FU leucovorin dose reduced secondary to diarrhea Cycle 6 FOLFOX 02/27/2023-5-FU bolus eliminated, 5-FU  pump dose reduced due to diarrhea; oxaliplatin dose reduced due to thrombocytopenia Cycle 7 FOLFOX 03/13/2023-oxaliplatin held due to persistent cold sensitivity Cycle 8 FOLFOX 03/27/2023-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 Cycle 9 FOLFOX 04/10/2023-oxaliplatin held, 5-FU pump  dose escalated Cycle 10 FOLFOX 04/24/2023-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 Cycle 11 FOLFOX 05/07/2023-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 Cycle 12 FOLFOX 05/22/2023-patient declined   Iron deficiency anemia-he reports receiving IV iron prior to the colon surgery 12/18/2022 ferritin 11, hemoglobin 11.1/MCV 79 12/18/2022 ferrous sulfate 1 tablet daily Hypertension History of kidney stones Colonoscopy 10/10/2022-mass could not be passed with the scope Port-A-Cath placement, Interventional Radiology, 12/17/2022 Admission 02/18/2023 with abdominal pain and diarrhea CT abdomen/pelvis 02/18/2023-multiple renal calculi, mucosa enhancement in the sigmoid colon, "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  Erythematous pruritic rash over the extremities following cycle 10 and cycle eleven 5-FU        Disposition: [name] [name] appears stable.  He again developed a rash following 5-fluorouracil.  The rash improved following a steroid Dosepak and appears to be fading.  He does not wish to receive further chemotherapy.  A Port-A-Cath remains in place.  He will return for a Port-A-Cath flush and CEA in approximately 8 weeks.  He will be scheduled for surveillance imaging in April and a surveillance colonoscopy in May.  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  The plan is to complete at least 6 months of anticoagulation therapy.  I encouraged him to follow-up with his primary provider for management of hypertension.  Thornton Papas, MD  05/22/2023  8:57 AM

## 2023-05-22 NOTE — Progress Notes (Signed)
Patient seen by Dr. Thornton Papas today  Vitals are not within treatment parameters:BP 153/102  Labs are within treatment parameters: Yes CMP pending  Treatment plan has been signed: Yes   Per physician team, Patient will not be receiving treatment today. Patient does not wish to pursue further chemo.

## 2023-05-23 ENCOUNTER — Other Ambulatory Visit: Payer: Self-pay

## 2023-05-23 ENCOUNTER — Other Ambulatory Visit: Payer: Self-pay | Admitting: *Deleted

## 2023-05-23 DIAGNOSIS — C184 Malignant neoplasm of transverse colon: Secondary | ICD-10-CM

## 2023-05-24 ENCOUNTER — Inpatient Hospital Stay: Payer: BC Managed Care – PPO

## 2023-06-02 ENCOUNTER — Other Ambulatory Visit: Payer: Self-pay

## 2023-07-03 ENCOUNTER — Other Ambulatory Visit: Payer: Self-pay

## 2023-07-15 ENCOUNTER — Other Ambulatory Visit: Payer: Self-pay

## 2023-07-15 ENCOUNTER — Emergency Department (HOSPITAL_BASED_OUTPATIENT_CLINIC_OR_DEPARTMENT_OTHER)
Admission: EM | Admit: 2023-07-15 | Discharge: 2023-07-15 | Disposition: A | Discharge status: Home / Self Care | Payer: BC Managed Care – PPO | Attending: Emergency Medicine | Admitting: Emergency Medicine

## 2023-07-15 ENCOUNTER — Emergency Department (HOSPITAL_BASED_OUTPATIENT_CLINIC_OR_DEPARTMENT_OTHER): Payer: BC Managed Care – PPO

## 2023-07-15 ENCOUNTER — Encounter (HOSPITAL_BASED_OUTPATIENT_CLINIC_OR_DEPARTMENT_OTHER): Payer: Self-pay

## 2023-07-15 DIAGNOSIS — K852 Alcohol induced acute pancreatitis without necrosis or infection: Secondary | ICD-10-CM | POA: Diagnosis not present

## 2023-07-15 DIAGNOSIS — R1013 Epigastric pain: Secondary | ICD-10-CM | POA: Diagnosis present

## 2023-07-15 DIAGNOSIS — Z7901 Long term (current) use of anticoagulants: Secondary | ICD-10-CM | POA: Insufficient documentation

## 2023-07-15 LAB — COMPREHENSIVE METABOLIC PANEL
ALT: 44 U/L (ref 0–44)
AST: 39 U/L (ref 15–41)
Albumin: 4.6 g/dL (ref 3.5–5.0)
Alkaline Phosphatase: 74 U/L (ref 38–126)
Anion gap: 12 (ref 5–15)
BUN: 13 mg/dL (ref 8–23)
CO2: 25 mmol/L (ref 22–32)
Calcium: 9.2 mg/dL (ref 8.9–10.3)
Chloride: 99 mmol/L (ref 98–111)
Creatinine, Ser: 0.92 mg/dL (ref 0.61–1.24)
GFR, Estimated: >60 mL/min (ref >60)
Glucose, Bld: 118 mg/dL — ABNORMAL HIGH (ref 70–99)
Potassium: 3.6 mmol/L (ref 3.5–5.1)
Sodium: 136 mmol/L (ref 135–145)
Total Bilirubin: 1 mg/dL (ref 0.0–1.2)
Total Protein: 7.5 g/dL (ref 6.5–8.1)

## 2023-07-15 LAB — CBC WITH DIFFERENTIAL/PLATELET
Abs Immature Granulocytes: 0.04 10*3/uL (ref 0.00–0.07)
Basophils Absolute: 0.1 10*3/uL (ref 0.0–0.1)
Basophils Relative: 0 %
Eosinophils Absolute: 0.1 10*3/uL (ref 0.0–0.5)
Eosinophils Relative: 0 %
HCT: 45.6 % (ref 39.0–52.0)
Hemoglobin: 15.9 g/dL (ref 13.0–17.0)
Immature Granulocytes: 0 %
Lymphocytes Relative: 6 %
Lymphs Abs: 0.8 10*3/uL (ref 0.7–4.0)
MCH: 33 pg (ref 26.0–34.0)
MCHC: 34.9 g/dL (ref 30.0–36.0)
MCV: 94.6 fL (ref 80.0–100.0)
Monocytes Absolute: 0.8 10*3/uL (ref 0.1–1.0)
Monocytes Relative: 6 %
Neutro Abs: 10.5 10*3/uL — ABNORMAL HIGH (ref 1.7–7.7)
Neutrophils Relative %: 88 %
Platelets: 231 10*3/uL (ref 150–400)
RBC: 4.82 MIL/uL (ref 4.22–5.81)
RDW: 15.3 % (ref 11.5–15.5)
WBC: 12.2 10*3/uL — ABNORMAL HIGH (ref 4.0–10.5)
nRBC: 0 % (ref 0.0–0.2)

## 2023-07-15 LAB — LIPASE, BLOOD: Lipase: 201 U/L — ABNORMAL HIGH (ref 11–51)

## 2023-07-15 MED ORDER — IOHEXOL 350 MG/ML SOLN
100.0000 mL | Freq: Once | INTRAVENOUS | Status: AC | PRN
  Administered 2023-07-15: 100 mL via INTRAVENOUS

## 2023-07-15 MED ORDER — MORPHINE SULFATE (PF) 4 MG/ML IV SOLN
4.0000 mg | Freq: Once | INTRAVENOUS | Status: AC
  Administered 2023-07-15: 4 mg via INTRAVENOUS
  Filled 2023-07-15: qty 1

## 2023-07-15 MED ORDER — SODIUM CHLORIDE 0.9 % IV BOLUS
1000.0000 mL | Freq: Once | INTRAVENOUS | Status: AC
  Administered 2023-07-15: 1000 mL via INTRAVENOUS

## 2023-07-15 MED ORDER — ONDANSETRON HCL 4 MG/2ML IJ SOLN
4.0000 mg | Freq: Once | INTRAMUSCULAR | Status: AC
  Administered 2023-07-15: 4 mg via INTRAVENOUS
  Filled 2023-07-15: qty 2

## 2023-07-15 MED ORDER — MORPHINE SULFATE 15 MG PO TABS: 7.5000 mg | ORAL_TABLET | ORAL | 0 refills | Status: DC | PRN

## 2023-07-15 MED ORDER — ONDANSETRON 4 MG PO TBDP: ORAL_TABLET | ORAL | 0 refills | Status: AC

## 2023-07-15 NOTE — Discharge Instructions (Signed)
 You have pancreatitis.  Please follow-up with your gastroenterologist in the office.  Please return for worsening pain fever inability eat or drink.  Take 4 over the counter ibuprofen tablets 3 times a day or 2 over-the-counter naproxen tablets twice a day for pain. Also take tylenol  1000mg (2 extra strength) four times a day.   Then take the pain medicine if you feel like you need it. Narcotics do not help with the pain, they only make you care about it less.  You can become addicted to this, people may break into your house to steal it.  It will constipate you.  If you drive under the influence of this medicine you can get a DUI.

## 2023-07-15 NOTE — ED Triage Notes (Signed)
 Pt POV from home c/o abd pain that started yesterday. Pt w/ hx of colon cancer w/ bowel resection in May '24. Pt denies NVD and other complaints.

## 2023-07-15 NOTE — ED Provider Notes (Addendum)
 [place] EMERGENCY DEPARTMENT AT [facility] Provider Note   CSN: [id number] Arrival date & time: 07/15/23  9342     History  Chief Complaint  Patient presents with   Abdominal Pain    [name] [name] is a 63 y.o. male.  63 Yo M with a chief complaint of epigastric abdominal pain.  Going on since yesterday.  Has not felt like eating and drinking.  Feels bloated.  [name] vomiting [name] fevers.  Has a history of a acute SMA occlusion.  He thinks this feels somewhat similar.   Abdominal Pain      Home Medications Prior to Admission medications   Medication Sig Start Date End Date Taking? Authorizing Provider  morphine  (MSIR) 15 MG tablet Take 0.5 tablets (7.5 mg total) by mouth every 4 (four) hours as needed for severe pain (pain score 7-10). 07/15/23  Yes Emil Share, DO  ondansetron  (ZOFRAN -ODT) 4 MG disintegrating tablet 4mg  ODT q4 hours prn nausea/vomit 07/15/23  Yes Emil Share, DO  apixaban  (ELIQUIS ) 5 MG TABS tablet Take 1 tablet (5 mg total) by mouth 2 (two) times daily. 03/13/23   Debby Olam POUR, NP  cloNIDine  (CATAPRES ) 0.1 MG tablet Take 1 tablet (0.1 mg total) by mouth every 6 (six) hours as needed. Patient not taking: Reported on 12/07/2022 02/14/17   Baxter Drivers, MD  diphenoxylate -atropine  (LOMOTIL ) 2.5-0.025 MG tablet Take 1-2 tablets by mouth 4 (four) times daily as needed for diarrhea or loose stools (Maximum of 8/day). 02/04/23   Cloretta Arley NOVAK, MD  loperamide  (IMODIUM  A-D) 2 MG tablet Take 2 mg by mouth 4 (four) times daily as needed for diarrhea or loose stools.    [provider]  LORazepam  (ATIVAN ) 0.5 MG tablet Take 1 tablet (0.5 mg total) by mouth at bedtime as needed for sleep (do not drive). Future refills per PCP 05/22/23   Cloretta Arley NOVAK, MD  promethazine  (PHENERGAN ) 12.5 MG tablet Take 1 tablet (12.5 mg total) by mouth every 6 (six) hours as needed for nausea or vomiting. Patient not taking: Reported on 01/30/2023 12/18/22   Debby Olam POUR, NP   tamsulosin  (FLOMAX ) 0.4 MG CAPS capsule Take 1 capsule (0.4 mg total) by mouth 2 (two) times daily. 04/13/18   Vicky Charleston, PA-C      Allergies    Compazine [prochlorperazine edisylate] and Prochlorperazine    Review of Systems   Review of Systems  Gastrointestinal:  Positive for abdominal pain.    Physical Exam Updated Vital Signs BP (!) 173/114   Pulse 94   Temp 98 F (36.7 C) (Oral)   Resp 18   Ht 5' 4 (1.626 m)   Wt 88.9 kg   SpO2 96%   BMI 33.64 kg/m  Physical Exam Vitals and nursing note reviewed.  Constitutional:      Appearance: He is well-developed.  HENT:     Head: Normocephalic and atraumatic.  Eyes:     Pupils: Pupils are equal, round, and reactive to light.  Neck:     Vascular: [name] JVD.  Cardiovascular:     Rate and Rhythm: Normal rate and regular rhythm.     Heart sounds: [name] murmur heard.    [name] friction rub. [name] gallop.  Pulmonary:     Effort: [name] respiratory distress.     Breath sounds: [name] wheezing.  Abdominal:     General: There is [name] distension.     Tenderness: There is abdominal tenderness. There is [name] guarding or rebound.  Comments: Mild tenderness to the upper abdomen worse for me in the epigastrium and right upper quadrant  Musculoskeletal:        General: Normal range of motion.     Cervical back: Normal range of motion and neck supple.  Skin:    Coloration: Skin is not pale.     Findings: [name] rash.  Neurological:     Mental Status: He is alert and oriented to person, place, and time.  Psychiatric:        Behavior: Behavior normal.     ED Results / Procedures / Treatments   Labs (all labs ordered are listed, but only abnormal results are displayed) Labs Reviewed  CBC WITH DIFFERENTIAL/PLATELET - Abnormal; Notable for the following components:      Result Value   WBC 12.2 (*)    Neutro Abs 10.5 (*)    All other components within normal limits  COMPREHENSIVE METABOLIC PANEL - Abnormal; Notable for the following components:    Glucose, Bld 118 (*)    All other components within normal limits  LIPASE, BLOOD - Abnormal; Notable for the following components:   Lipase 201 (*)    All other components within normal limits    EKG None  Radiology CT Angio Abd/Pel W and/or Wo Contrast Result Date: 07/15/2023 CLINICAL DATA:  Follow up colon cancer chemotherapy. Epigastric pain. Previous SMV thrombus. EXAM: CTA ABDOMEN AND PELVIS WITHOUT AND WITH CONTRAST TECHNIQUE: Multidetector CT imaging of the abdomen and pelvis was performed using the standard protocol during bolus administration of intravenous contrast. Multiplanar reconstructed images and MIPs were obtained and reviewed to evaluate the vascular anatomy. RADIATION DOSE REDUCTION: This exam was performed according to the departmental dose-optimization program which includes automated exposure control, adjustment of the mA and/or kV according to patient size and/or use of iterative reconstruction technique. CONTRAST:  OMNIPAQUE  IOHEXOL  350 MG/ML SOLN COMPARISON:  CT 02/18/2023 and older. FINDINGS: VASCULAR Aorta: Minimal atherosclerotic calcified plaque. [name] dissection or aneurysm formation. Celiac: Patent without evidence of aneurysm, dissection, vasculitis or significant stenosis. SMA: Patent without evidence of aneurysm, dissection, vasculitis or significant stenosis. Renals: Single right and 2 left renal arteries identified. Minimal plaque along the smaller of the 2 left renal arteries. [name] significant stenosis. IMA: Patent without evidence of aneurysm, dissection, vasculitis or significant stenosis. Inflow: Slightly tortuous course of the iliac vessels but grossly patent. [name] significant stenosis. Proximal Outflow: Grossly preserved.  Minimal calcified plaque. Veins: Previously the SMV had focal nonocclusive thrombus proximally. This has improved this has portions of the vessel is enhancing on the portal venous phase. There are some mesenteric collaterals identified in  some scattered mesenteric central stranding. Few tiny nodes. Review of the MIP images confirms the above findings. NON-VASCULAR Lower chest: Mild linear opacity lung bases likely scar or atelectasis. [name] significant pleural effusion. Hepatobiliary: Mild fatty liver infiltration. [name] space-occupying liver lesion. Patent portal vein. Gallbladder is present. Pancreas: Mild global atrophy of the pancreas. Spleen: Spleen is normal in size.  Preserved enhancement. Adrenals/Urinary Tract: Adrenal glands are preserved. [name] enhancing renal mass or collecting system dilatation. The ureters have normal course and caliber down to the normal caliber urinary bladder. Multiple bilateral nonobstructing renal stones are identified measuring up to 7 mm. Stomach/Bowel: Surgical changes of previous right hemicolectomy. On this non oral contrast exam the residual colon is nondilated. Stomach is nondilated. Small bowel has a overall normal course and caliber. Previously there were some mild wall thickening suggested along the rectosigmoid colon. This has  again suggested and correlate with any symptoms Lymphatic: [name] specific abnormal lymph node enlargement seen in the abdomen and pelvis. Reproductive: Prostate is unremarkable. Other: Again has a above increased mesenteric stranding with some presumed reactive nodes. Small bilateral fat containing inguinal hernias, left-greater-than-right. Musculoskeletal: Moderate degenerative changes of the spine with a mild curvature. Multilevel posterior osteophytes and disc bulging in the lumbar region with areas of stenosis. IMPRESSION: VASCULAR Minimal atherosclerotic calcified plaque. Previous SMV thrombus is [name] longer identified. These vessels are patent today. There increased mesenteric collaterals as well as some stranding and reactive small nodes. NON-VASCULAR Surgical changes of prior right hemicolectomy. [name] bowel obstruction or dilatation. There is some persistent mild wall thickening, fold  thickening of the rectosigmoid colon. Please correlate with symptoms. Fatty liver infiltration. Bilateral nonobstructing renal stones. Moderate degenerative changes of the spine with stenosis. Electronically Signed   By: Ranell Bring M.D.   On: 07/15/2023 10:02    Procedures Procedures    Medications Ordered in ED Medications  morphine  (PF) 4 MG/ML injection 4 mg (4 mg Intravenous Given 07/15/23 0747)  ondansetron  (ZOFRAN ) injection 4 mg (4 mg Intravenous Given 07/15/23 0746)  sodium chloride  0.9 % bolus 1,000 mL (0 mLs Intravenous Stopped 07/15/23 0943)  iohexol  (OMNIPAQUE ) 350 MG/ML injection 100 mL (100 mLs Intravenous Contrast Given 07/15/23 0845)  morphine  (PF) 4 MG/ML injection 4 mg (4 mg Intravenous Given 07/15/23 1001)    ED Course/ Medical Decision Making/ A&P                                 Medical Decision Making Amount and/or Complexity of Data Reviewed Labs: ordered. Radiology: ordered.  Risk Prescription drug management.   63 yo M with a chief complaints of abdominal pain.  Going on since yesterday.  On my record review the patient has a history of an SMA occlusion.  He thinks this feels somewhat similar.  Will obtain blood work treat pain and nausea CT scan of the abdomen pelvis.  Patient has a leukocytosis with neutrophilic predominance.  [name] anemia, [name] significant electrolyte abnormalities.  LFTs are unremarkable.  Patient's lipase is elevated at 200.  With epigastric discomfort thin pancreatitis does make some clinical sense.  Patient had CT angiogram without recurrence of his SMA thrombosis.  He had some nonspecific colon inflammation read by radiology.  Pancreas looked unremarkable.  I discussed the results with the patient and family.  I offered admission.  He is feeling a bit better and would like to try and go home.  The patient did self-report that he did drink pretty heavily over the weekend and inquired if that was part of the reason for his abdominal pain.  I  encouraged him to follow-up with his gastroenterologist in the office.  He has an appointment with his oncologist tomorrow.  10:16 AM:  I have discussed the diagnosis/risks/treatment options with the patient and family.  Evaluation and diagnostic testing in the emergency department does not suggest an emergent condition requiring admission or immediate intervention beyond what has been performed at this time.  They will follow up with GI, PCP. We also discussed returning to the ED immediately if new or worsening sx occur. We discussed the sx which are most concerning (e.g., sudden worsening pain, fever, inability to tolerate by mouth) that necessitate immediate return. Medications administered to the patient during their visit and any new prescriptions provided to the patient are listed below.  Medications given during this visit Medications  morphine  (PF) 4 MG/ML injection 4 mg (4 mg Intravenous Given 07/15/23 0747)  ondansetron  (ZOFRAN ) injection 4 mg (4 mg Intravenous Given 07/15/23 0746)  sodium chloride  0.9 % bolus 1,000 mL (0 mLs Intravenous Stopped 07/15/23 0943)  iohexol  (OMNIPAQUE ) 350 MG/ML injection 100 mL (100 mLs Intravenous Contrast Given 07/15/23 0845)  morphine  (PF) 4 MG/ML injection 4 mg (4 mg Intravenous Given 07/15/23 1001)     The patient appears reasonably screen and/or stabilized for discharge and I doubt any other medical condition or other [facility] requiring further screening, evaluation, or treatment in the ED at this time prior to discharge.          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

## 2023-07-16 ENCOUNTER — Inpatient Hospital Stay: Payer: BC Managed Care – PPO

## 2023-07-16 ENCOUNTER — Other Ambulatory Visit: Payer: BC Managed Care – PPO

## 2023-07-16 ENCOUNTER — Inpatient Hospital Stay: Payer: BC Managed Care – PPO | Admitting: Oncology

## 2023-07-17 ENCOUNTER — Other Ambulatory Visit: Payer: Self-pay

## 2023-08-02 ENCOUNTER — Inpatient Hospital Stay: Payer: BC Managed Care – PPO | Attending: Nurse Practitioner

## 2023-08-02 ENCOUNTER — Inpatient Hospital Stay (HOSPITAL_BASED_OUTPATIENT_CLINIC_OR_DEPARTMENT_OTHER): Payer: BC Managed Care – PPO | Admitting: Oncology

## 2023-08-02 ENCOUNTER — Inpatient Hospital Stay: Payer: BC Managed Care – PPO

## 2023-08-02 VITALS — BP 141/98 | HR 73 | Temp 98.2°F | Resp 18 | Ht 64.0 in | Wt 193.5 lb

## 2023-08-02 DIAGNOSIS — F17200 Nicotine dependence, unspecified, uncomplicated: Secondary | ICD-10-CM | POA: Insufficient documentation

## 2023-08-02 DIAGNOSIS — C184 Malignant neoplasm of transverse colon: Secondary | ICD-10-CM

## 2023-08-02 DIAGNOSIS — Z5111 Encounter for antineoplastic chemotherapy: Secondary | ICD-10-CM | POA: Diagnosis present

## 2023-08-02 DIAGNOSIS — Z8719 Personal history of other diseases of the digestive system: Secondary | ICD-10-CM | POA: Insufficient documentation

## 2023-08-02 DIAGNOSIS — Z79899 Other long term (current) drug therapy: Secondary | ICD-10-CM | POA: Diagnosis not present

## 2023-08-02 DIAGNOSIS — Z87442 Personal history of urinary calculi: Secondary | ICD-10-CM | POA: Insufficient documentation

## 2023-08-02 DIAGNOSIS — K859 Acute pancreatitis without necrosis or infection, unspecified: Secondary | ICD-10-CM | POA: Insufficient documentation

## 2023-08-02 DIAGNOSIS — D509 Iron deficiency anemia, unspecified: Secondary | ICD-10-CM | POA: Insufficient documentation

## 2023-08-02 DIAGNOSIS — R109 Unspecified abdominal pain: Secondary | ICD-10-CM | POA: Diagnosis not present

## 2023-08-02 DIAGNOSIS — D696 Thrombocytopenia, unspecified: Secondary | ICD-10-CM | POA: Diagnosis not present

## 2023-08-02 DIAGNOSIS — R918 Other nonspecific abnormal finding of lung field: Secondary | ICD-10-CM | POA: Diagnosis not present

## 2023-08-02 DIAGNOSIS — I10 Essential (primary) hypertension: Secondary | ICD-10-CM | POA: Diagnosis not present

## 2023-08-02 DIAGNOSIS — R197 Diarrhea, unspecified: Secondary | ICD-10-CM | POA: Diagnosis not present

## 2023-08-02 DIAGNOSIS — G629 Polyneuropathy, unspecified: Secondary | ICD-10-CM | POA: Insufficient documentation

## 2023-08-02 DIAGNOSIS — Z95828 Presence of other vascular implants and grafts: Secondary | ICD-10-CM

## 2023-08-02 LAB — CMP (CANCER CENTER ONLY)
ALT: 28 U/L (ref 0–44)
AST: 23 U/L (ref 15–41)
Albumin: 4.1 g/dL (ref 3.5–5.0)
Alkaline Phosphatase: 61 U/L (ref 38–126)
Anion gap: 7 (ref 5–15)
BUN: 13 mg/dL (ref 8–23)
CO2: 28 mmol/L (ref 22–32)
Calcium: 9.8 mg/dL (ref 8.9–10.3)
Chloride: 103 mmol/L (ref 98–111)
Creatinine: 0.95 mg/dL (ref 0.61–1.24)
GFR, Estimated: >60 mL/min (ref >60)
Glucose, Bld: 123 mg/dL — ABNORMAL HIGH (ref 70–99)
Potassium: 4.1 mmol/L (ref 3.5–5.1)
Sodium: 138 mmol/L (ref 135–145)
Total Bilirubin: 0.6 mg/dL (ref 0.0–1.2)
Total Protein: 7.2 g/dL (ref 6.5–8.1)

## 2023-08-02 LAB — CEA (ACCESS): CEA (CHCC): 2.12 ng/mL (ref 0.00–5.00)

## 2023-08-02 MED ORDER — HEPARIN SOD (PORK) LOCK FLUSH 100 UNIT/ML IV SOLN
500.0000 [IU] | Freq: Once | INTRAVENOUS | Status: AC
  Administered 2023-08-02: 500 [IU] via INTRAVENOUS

## 2023-08-02 MED ORDER — SODIUM CHLORIDE 0.9% FLUSH
10.0000 mL | Freq: Once | INTRAVENOUS | Status: AC
  Administered 2023-08-02: 10 mL via INTRAVENOUS

## 2023-08-02 NOTE — Patient Instructions (Signed)
 Implanted [facility] Guide An implanted port is a device that is placed under the skin. It is usually placed in the chest. The device may vary based on the need. Implanted ports can be used to give IV medicine, to take blood, or to give fluids. You may have an implanted port if: You need IV medicine that would be irritating to the small veins in your hands or arms. You need IV medicines, such as chemotherapy, for a long period of time. You need IV nutrition for a long period of time. You may have fewer limitations when using a port than you would if you used other types of long-term IVs. You will also likely be able to return to normal activities after your incision heals. An implanted port has two main parts: Reservoir. The reservoir is the part where a needle is inserted to give medicines or draw blood. The reservoir is round. After the port is placed, it appears as a small, raised area under your skin. Catheter. The catheter is a small, thin tube that connects the reservoir to a vein. Medicine that is inserted into the reservoir goes into the catheter and then into the vein. How is my port accessed? To access your port: A numbing cream may be placed on the skin over the port site. Your health care provider will put on a mask and sterile gloves. The skin over your port will be cleaned carefully with a germ-killing soap and allowed to dry. Your health care provider will gently pinch the port and insert a needle into it. Your health care provider will check for a blood return to make sure the port is in the vein and is still working (patent). If your port needs to remain accessed to get medicine continuously (constant infusion), your health care provider will place a clear bandage (dressing) over the needle site. The dressing and needle will need to be changed every week, or as told by your health care provider. What is flushing? Flushing helps keep the port working. Follow instructions from your  health care provider about how and when to flush the port. Ports are usually flushed with saline solution or a medicine called heparin. The need for flushing will depend on how the port is used: If the port is only used from time to time to give medicines or draw blood, the port may need to be flushed: Before and after medicines have been given. Before and after blood has been drawn. As part of routine maintenance. Flushing may be recommended every 4-6 weeks. If a constant infusion is running, the port may not need to be flushed. Throw away any syringes in a disposal container that is meant for sharp items (sharps container). You can buy a sharps container from a pharmacy, or you can make one by using an empty hard plastic bottle with a cover. How long will my port stay implanted? The port can stay in for as long as your health care provider thinks it is needed. When it is time for the port to come out, a surgery will be done to remove it. The surgery will be similar to the procedure that was done to put the port in. Follow these instructions at home: Caring for your port and port site Flush your port as told by your health care provider. If you need an infusion over several days, follow instructions from your health care provider about how to take care of your port site. Make sure you: Change your  dressing as told by your health care provider. Wash your hands with soap and water for at least 20 seconds before and after you change your dressing. If soap and water are not available, use alcohol-based hand sanitizer. Place any used dressings or infusion bags into a plastic bag. Throw that bag in the trash. Keep the dressing that covers the needle clean and dry. Do not get it wet. Do not use scissors or sharp objects near the infusion tubing. Keep any external tubes clamped, unless they are being used. Check your port site every day for signs of infection. Check for: Redness, swelling, or  pain. Fluid or blood. Warmth. Pus or a bad smell. Protect the skin around the port site. Avoid wearing bra straps that rub or irritate the site. Protect the skin around your port from seat belts. Place a soft pad over your chest if needed. Bathe or shower as told by your health care provider. The site may get wet as long as you are not actively receiving an infusion. General instructions  Return to your normal activities as told by your health care provider. Ask your health care provider what activities are safe for you. Carry a medical alert card or wear a medical alert bracelet at all times. This will let health care providers know that you have an implanted port in case of an emergency. Where to find more information American Cancer Society: www.cancer.org American Society of Clinical Oncology: www.cancer.net Contact a health care provider if: You have a fever or chills. You have redness, swelling, or pain at the port site. You have fluid or blood coming from your port site. Your incision feels warm to the touch. You have pus or a bad smell coming from the port site. Summary Implanted ports are usually placed in the chest for long-term IV access. Follow instructions from your health care provider about flushing the port and changing bandages (dressings). Take care of the area around your port by avoiding clothing that puts pressure on the area, and by watching for signs of infection. Protect the skin around your port from seat belts. Place a soft pad over your chest if needed. Contact a health care provider if you have a fever or you have redness, swelling, pain, fluid, or a bad smell at the port site. This information is not intended to replace advice given to you by your health care provider. Make sure you discuss any questions you have with your health care provider. Document Revised: 12/20/2020 Document Reviewed: 12/20/2020 Elsevier Patient Education  2024 [employer].

## 2023-08-02 NOTE — Progress Notes (Signed)
[name] [name].  He generally feels well.  Good appetite.  No difficulty with bowel function.  No bleeding.  He continues Xarelto anticoagulation.  He developed acute abdominal pain 07/15/2023 and was seen in the emergency room.  He was diagnosed with pancreatitis.  The pain resolved.  He reports heavy alcohol use for 2 days prior to developing pancreatitis.  Objective:  Vital signs in last 24 hours:  Blood pressure (!) 141/98, pulse 73, temperature 98.2 F (36.8 C), temperature source Temporal, resp. rate 18, height 5\' 4"  (1.626 m), weight 193 lb 8 oz (87.8 kg), SpO2 96%.    Lymphatics: No cervical, supraclavicular, axillary, or inguinal nodes Resp: Lungs clear bilaterally Cardio: Regular rate and rhythm GI: No hepatosplenomegaly, no mass, nontender, reducible left inguinal hernia? Vascular: No leg edema   Portacath/PICC-without erythema  Lab Results:  Lab Results  Component Value Date   WBC 12.2 (H) 07/15/2023   HGB 15.9 07/15/2023   HCT 45.6 07/15/2023   MCV 94.6 07/15/2023   PLT 231 07/15/2023   NEUTROABS 10.5 (H) 07/15/2023    CMP  Lab Results  Component Value Date   NA 136 07/15/2023   K 3.6 07/15/2023   CL 99 07/15/2023   CO2 25 07/15/2023   GLUCOSE 118 (H) 07/15/2023   BUN 13 07/15/2023   CREATININE 0.92 07/15/2023   CALCIUM 9.2 07/15/2023   PROT 7.5 07/15/2023   ALBUMIN 4.6 07/15/2023   AST 39 07/15/2023   ALT 44 07/15/2023   ALKPHOS 74 07/15/2023   BILITOT 1.0 07/15/2023   GFRNONAA >60 07/15/2023   GFRAA >60 04/13/2018    Lab Results  Component Value Date   CEA 2.07 05/22/2023    Medications: I have reviewed the patient's current medications.   Assessment/Plan: Colon cancer, stage IIIb (pT4a, pN1a) Colonoscopy 10/10/2022 -circumferential fungating mass thought to represent the proximal descending colon at 70 cm from the anus that  could not be passed with the scope.  Biopsy showed adenocarcinoma, at least intramucosal; intact expression of all 4 mismatch repair proteins.   CT scans 10/17/2022-2 noncalcified nodules adjacent to the horizontal fissure within the right thorax between the right upper lobe and right middle lobe measuring 11 and 7 mm in size; no other pulmonary nodules; 10 mm right paratracheal lymph node liver was negative for suspicious findings; spleen with 2 small densities; a constricting lesion of the distal transverse colon suspected seen to the left of midline adjacent to the splenic flexure measuring 3.6 cm in length; significant narrowing of the lumen of the bowel at this location with bowel wall thickening; hazy stranding within the adjacent mesenteric fat; along the superior margin of the lesion and exophytic 12 mm density possibly representing local invasion and/or adjacent adenopathy.   CEA 10/17/2022 3.0 (normal range for non-smokers less than 3.0, smokers less than 5.0) Laparoscopic extended right hemicolectomy 11/05/2022 by Dr. Byrd [name].  Final pathology showed 6 cm invasive moderately differentiated adenocarcinoma in the distal transverse colon, invading the visceral peritoneum; resection margins negative; positive lymphovascular and perineural invasion; 1 out of 26 lymph nodes positive for carcinoma; no tumor deposits identified; appendiceal serrated lesion with low-grade dysplasia; pT4a, pN1a Cycle 1 FOLFOX 12/19/2022 Cycle 2 FOLFOX 01/02/2023 Cycle 3 FOLFOX 01/16/2023 Cycle 4 FOLFOX 01/30/2023 Cycle 5 FOLFOX 02/13/2023-5-FU leucovorin dose reduced secondary to diarrhea Cycle 6 FOLFOX 02/27/2023-5-FU bolus eliminated, 5-FU pump dose reduced due to diarrhea;  oxaliplatin dose reduced due to thrombocytopenia Cycle 7 FOLFOX 03/13/2023-oxaliplatin held due to persistent cold sensitivity Cycle 8 FOLFOX 03/27/2023-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 Cycle 9 FOLFOX 04/10/2023-oxaliplatin held, 5-FU pump dose escalated Cycle  10 FOLFOX 04/24/2023-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 Cycle 11 FOLFOX 05/07/2023-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 Cycle 12 FOLFOX 05/22/2023-patient declined CT angiogram abdomen/pelvis 07/15/2023: SMV thrombus not identified, increased mesenteric collaterals, persistent mild wall thickening of the rectosigmoid, bilateral nonobstructing renal stones   Iron deficiency anemia-he reports receiving IV iron prior to the colon surgery 12/18/2022 ferritin 11, hemoglobin 11.1/MCV 79 12/18/2022 ferrous sulfate 1 tablet daily Hypertension History of kidney stones Colonoscopy 10/10/2022-mass could not be passed with the scope Port-A-Cath placement, Interventional Radiology, 12/17/2022 Admission 02/18/2023 with abdominal pain and diarrhea CT abdomen/pelvis 02/18/2023-multiple renal calculi, mucosa enhancement in the sigmoid colon, "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  Erythematous pruritic rash over the extremities following cycle 10 and cycle eleven 5-FU 10.  Pancreatitis 07/15/2023      Disposition: Mr. [name] [name] is in clinical remission from colon cancer.  The CT 07/15/2023 revealed no evidence for recurrent disease.  He will be [name] for a surveillance chest CT in April.  We will follow-up on the CEA from today.  He will return for an office visit in April.  I recommended he continue anticoagulation therapy given the recent episode of pancreatitis.  He plans to abstain from alcohol use.  Mr. [name] [name] will be referred for Port-A-Cath removal.  He is due for a surveillance colonoscopy this spring.  [name] Papas, MD  08/02/2023  8:34 AM

## 2023-08-03 ENCOUNTER — Other Ambulatory Visit: Payer: Self-pay

## 2023-08-14 ENCOUNTER — Ambulatory Visit (HOSPITAL_COMMUNITY)
Admission: RE | Admit: 2023-08-14 | Discharge: 2023-08-14 | Disposition: A | Discharge status: Home / Self Care | Payer: BC Managed Care – PPO | Source: Ambulatory Visit | Attending: Oncology | Admitting: Oncology

## 2023-08-14 DIAGNOSIS — Z452 Encounter for adjustment and management of vascular access device: Secondary | ICD-10-CM | POA: Diagnosis present

## 2023-08-14 DIAGNOSIS — C184 Malignant neoplasm of transverse colon: Secondary | ICD-10-CM | POA: Diagnosis not present

## 2023-08-14 HISTORY — PX: IR REMOVAL TUN ACCESS W/ PORT W/O FL MOD SED: IMG2290

## 2023-08-14 MED ORDER — LIDOCAINE-EPINEPHRINE 1 %-1:100000 IJ SOLN
INTRAMUSCULAR | Status: AC
  Filled 2023-08-14: qty 1

## 2023-08-14 MED ORDER — LIDOCAINE-EPINEPHRINE 1 %-1:100000 IJ SOLN
20.0000 mL | Freq: Once | INTRAMUSCULAR | Status: AC
  Administered 2023-08-14: 8 mL via INTRADERMAL

## 2023-08-14 NOTE — Procedures (Signed)
Interventional Radiology Procedure Note  Procedure: Chest port removal   Indication: Completion of chemotherapy  Findings: Please refer to procedural dictation for full description.  Complications: None  EBL: < 10 mL  Acquanetta Belling, MD [phone]

## 2023-08-22 ENCOUNTER — Encounter: Payer: Self-pay | Admitting: *Deleted

## 2023-10-08 ENCOUNTER — Ambulatory Visit (HOSPITAL_BASED_OUTPATIENT_CLINIC_OR_DEPARTMENT_OTHER)
Admission: RE | Admit: 2023-10-08 | Discharge: 2023-10-08 | Disposition: A | Discharge status: Home / Self Care | Payer: BC Managed Care – PPO | Source: Ambulatory Visit | Attending: Oncology | Admitting: Oncology

## 2023-10-08 DIAGNOSIS — C184 Malignant neoplasm of transverse colon: Secondary | ICD-10-CM | POA: Insufficient documentation

## 2023-10-15 ENCOUNTER — Telehealth: Payer: Self-pay | Admitting: Oncology

## 2023-10-15 ENCOUNTER — Inpatient Hospital Stay: Payer: BC Managed Care – PPO | Attending: Nurse Practitioner | Admitting: Oncology

## 2023-10-15 VITALS — BP 151/106 | HR 78 | Temp 98.1°F | Resp 18 | Ht 64.0 in | Wt 195.9 lb

## 2023-10-15 DIAGNOSIS — R10819 Abdominal tenderness, unspecified site: Secondary | ICD-10-CM | POA: Insufficient documentation

## 2023-10-15 DIAGNOSIS — R197 Diarrhea, unspecified: Secondary | ICD-10-CM | POA: Insufficient documentation

## 2023-10-15 DIAGNOSIS — K625 Hemorrhage of anus and rectum: Secondary | ICD-10-CM | POA: Diagnosis not present

## 2023-10-15 DIAGNOSIS — I10 Essential (primary) hypertension: Secondary | ICD-10-CM | POA: Diagnosis not present

## 2023-10-15 DIAGNOSIS — G629 Polyneuropathy, unspecified: Secondary | ICD-10-CM | POA: Diagnosis not present

## 2023-10-15 DIAGNOSIS — C184 Malignant neoplasm of transverse colon: Secondary | ICD-10-CM | POA: Diagnosis not present

## 2023-10-15 DIAGNOSIS — D509 Iron deficiency anemia, unspecified: Secondary | ICD-10-CM | POA: Insufficient documentation

## 2023-10-15 DIAGNOSIS — Z8719 Personal history of other diseases of the digestive system: Secondary | ICD-10-CM | POA: Diagnosis not present

## 2023-10-15 DIAGNOSIS — F17200 Nicotine dependence, unspecified, uncomplicated: Secondary | ICD-10-CM | POA: Insufficient documentation

## 2023-10-15 DIAGNOSIS — R918 Other nonspecific abnormal finding of lung field: Secondary | ICD-10-CM | POA: Insufficient documentation

## 2023-10-15 DIAGNOSIS — D696 Thrombocytopenia, unspecified: Secondary | ICD-10-CM | POA: Insufficient documentation

## 2023-10-15 DIAGNOSIS — Z87442 Personal history of urinary calculi: Secondary | ICD-10-CM | POA: Insufficient documentation

## 2023-10-15 DIAGNOSIS — Z79899 Other long term (current) drug therapy: Secondary | ICD-10-CM | POA: Diagnosis not present

## 2023-10-15 NOTE — Progress Notes (Signed)
  [facility] OFF[facility] PROGRESS NOTE   Diagnosis: Colon cancer  INTERVAL HISTORY:    [name] [name] returns as scheduled.  He reports intermittent diarrhea for the past 2 months.  He has mild abdominal tenderness.  He has bright red blood per rectum with bowel movements.  He reports a history of similar bleeding in the past.  He has decreased alcohol use.  He is now drinking less beer on the weekend.    Objective:  Vital signs in last 24 hours:  Blood pressure (!) 151/106, pulse 78, temperature 98.1 F (36.7 C), temperature source Temporal, resp. rate 18, height 5\' 4"  (1.626 m), weight 195 lb 14.4 oz (88.9 kg), SpO2 98%.    Lymphatics: No cervical, supraclavicular, axillary, or inguinal nodes Resp: Lungs clear bilaterally Cardio: Regular rate and rhythm GI: No mass, no hepatosplenomegaly, mild diffuse tenderness Vascular: No leg edema   Lab Results:  Lab Results  Component Value Date   WBC 12.2 (H) 07/15/2023   HGB 15.9 07/15/2023   HCT 45.6 07/15/2023   MCV 94.6 07/15/2023   PLT 231 07/15/2023   NEUTROABS 10.5 (H) 07/15/2023  Hemoglobin 16.4 05/21/2024-at [facility]  CMP  Lab Results  Component Value Date   NA 138 08/02/2023   K 4.1 08/02/2023   CL 103 08/02/2023   CO2 28 08/02/2023   GLUCOSE 123 (H) 08/02/2023   BUN 13 08/02/2023   CREATININE 0.95 08/02/2023   CALCIUM 9.8 08/02/2023   PROT 7.2 08/02/2023   ALBUMIN 4.1 08/02/2023   AST 23 08/02/2023   ALT 28 08/02/2023   ALKPHOS 61 08/02/2023   BILITOT 0.6 08/02/2023   GFRNONAA >60 08/02/2023   GFRAA >60 04/13/2018    Lab Results  Component Value Date   CEA 2.12 08/02/2023    Medications: I have reviewed the patient's current medications.   Assessment/Plan: Colon cancer, stage IIIb (pT4a, pN1a) Colonoscopy 10/10/2022 -circumferential fungating mass thought to represent the proximal descending colon at 70 cm from the anus that could not be passed with the scope.  Biopsy showed  adenocarcinoma, at least intramucosal; intact expression of all 4 mismatch repair proteins.   CT scans 10/17/2022-2 noncalcified nodules adjacent to the horizontal fissure within the right thorax between the right upper lobe and right middle lobe measuring 11 and 7 mm in size; no other pulmonary nodules; 10 mm right paratracheal lymph node liver was negative for suspicious findings; spleen with 2 small densities; a constricting lesion of the distal transverse colon suspected seen to the left of midline adjacent to the splenic flexure measuring 3.6 cm in length; significant narrowing of the lumen of the bowel at this location with bowel wall thickening; hazy stranding within the adjacent mesenteric fat; along the superior margin of the lesion and exophytic 12 mm density possibly representing local invasion and/or adjacent adenopathy.   CEA 10/17/2022 3.0 (normal range for non-smokers less than 3.0, smokers less than 5.0) Laparoscopic extended right hemicolectomy 11/05/2022 by Dr. Byrd Hesselbach.  Final pathology showed 6 cm invasive moderately differentiated adenocarcinoma in the distal transverse colon, invading the visceral peritoneum; resection margins negative; positive lymphovascular and perineural invasion; 1 out of 26 lymph nodes positive for carcinoma; no tumor deposits identified; appendiceal serrated lesion with low-grade dysplasia; pT4a, pN1a Cycle 1 FOLFOX 12/19/2022 Cycle 2 FOLFOX 01/02/2023 Cycle 3 FOLFOX 01/16/2023 Cycle 4 FOLFOX 01/30/2023 Cycle 5 FOLFOX 02/13/2023-5-FU leucovorin dose reduced secondary to diarrhea Cycle 6 FOLFOX 02/27/2023-5-FU bolus eliminated, 5-FU pump dose reduced due to diarrhea; oxaliplatin  dose reduced due to thrombocytopenia Cycle 7 FOLFOX 03/13/2023-oxaliplatin held due to persistent cold sensitivity Cycle 8 FOLFOX 03/27/2023-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 Cycle 9 FOLFOX 04/10/2023-oxaliplatin held, 5-FU pump dose escalated Cycle 10 FOLFOX 04/24/2023-oxaliplatin held due to  neuropathy Cycle 11 FOLFOX 05/07/2023-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 Cycle 12 FOLFOX 05/22/2023-patient declined CT angiogram abdomen/pelvis 07/15/2023: SMV thrombus not identified, increased mesenteric collaterals, persistent mild wall thickening of the rectosigmoid, bilateral nonobstructing renal stones CT chest 10/08/2023: No evidence of metastatic disease, stable tiny perifissural nodules in the right lung   Iron deficiency anemia-he reports receiving IV iron prior to the colon surgery 12/18/2022 ferritin 11, hemoglobin 11.1/MCV 79 12/18/2022 ferrous sulfate 1 tablet daily Hypertension History of kidney stones Colonoscopy 10/10/2022-mass could not be passed with the scope Port-A-Cath placement, Interventional Radiology, 12/17/2022 Admission 02/18/2023 with abdominal pain and diarrhea CT abdomen/pelvis 02/18/2023-multiple renal calculi, mucosa enhancement in the sigmoid colon, "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  Erythematous pruritic rash over the extremities following cycle 10 and cycle eleven 5-FU 10.  Pancreatitis 07/15/2023     Disposition: Mr Nakanishi is in clinical remission from colon cancer.  He will return for an office visit and CEA in 3 months.  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  No thrombus was seen on a CT angiogram abdomen/pelvis 07/15/2023.  He will discontinue anticoagulation.  I doubt his current GI symptoms are related to the history of SMV thrombosis.  He will call for recurrent abdominal pain or new symptoms.  We will refer him to [name] [name] for a 1 year surveillance colonoscopy and to evaluate the rectal bleeding/diarrhea.  He should follow-up with his primary provider for management of hypertension.  [name] Deep,  MD  10/15/2023  11:54 AM

## 2023-10-15 NOTE — Telephone Encounter (Signed)
 Patient has been scheduled for follow-up visit per 10/15/23 LOS.  Pt aware of scheduled appt details.

## 2023-10-15 NOTE — Telephone Encounter (Signed)
 Contacted pt to schedule an appt. Unable to reach via phone, voicemail box was full.     Follow-Up Information  Follow-up disposition: Return in about 3 months (around 01/14/2024) for Lab, office.  Check out comments: Office 3 months

## 2023-10-16 ENCOUNTER — Other Ambulatory Visit: Payer: Self-pay

## 2023-10-17 ENCOUNTER — Other Ambulatory Visit: Payer: Self-pay

## 2023-10-18 ENCOUNTER — Telehealth: Payer: Self-pay | Admitting: *Deleted

## 2023-10-18 NOTE — Telephone Encounter (Signed)
 Called Mr. Colden to f/u on GI referral to Dr. Alfonse Iha. He reports he was given an appointment there in September and was told his referral was not urgent. This RN spoke with referral coordinator, who said this may be the first available he has. Stressed that bloody diarrhea in colon cancer patient can't wait till September. She will reach out to Dr. Yves Herb again. Dr. Scherrie Curt made aware.

## 2024-01-14 ENCOUNTER — Inpatient Hospital Stay: Attending: Nurse Practitioner | Admitting: Oncology

## 2024-01-14 ENCOUNTER — Inpatient Hospital Stay: Attending: Nurse Practitioner

## 2024-01-14 VITALS — BP 139/88 | HR 79 | Temp 98.2°F | Resp 18 | Ht 64.0 in | Wt 192.0 lb

## 2024-01-14 DIAGNOSIS — G620 Drug-induced polyneuropathy: Secondary | ICD-10-CM | POA: Diagnosis not present

## 2024-01-14 DIAGNOSIS — Z79899 Other long term (current) drug therapy: Secondary | ICD-10-CM | POA: Diagnosis not present

## 2024-01-14 DIAGNOSIS — D696 Thrombocytopenia, unspecified: Secondary | ICD-10-CM | POA: Diagnosis not present

## 2024-01-14 DIAGNOSIS — K859 Acute pancreatitis without necrosis or infection, unspecified: Secondary | ICD-10-CM | POA: Diagnosis not present

## 2024-01-14 DIAGNOSIS — K625 Hemorrhage of anus and rectum: Secondary | ICD-10-CM | POA: Diagnosis not present

## 2024-01-14 DIAGNOSIS — Z87442 Personal history of urinary calculi: Secondary | ICD-10-CM | POA: Diagnosis not present

## 2024-01-14 DIAGNOSIS — T451X5A Adverse effect of antineoplastic and immunosuppressive drugs, initial encounter: Secondary | ICD-10-CM | POA: Insufficient documentation

## 2024-01-14 DIAGNOSIS — Z8719 Personal history of other diseases of the digestive system: Secondary | ICD-10-CM | POA: Diagnosis not present

## 2024-01-14 DIAGNOSIS — D509 Iron deficiency anemia, unspecified: Secondary | ICD-10-CM | POA: Insufficient documentation

## 2024-01-14 DIAGNOSIS — I10 Essential (primary) hypertension: Secondary | ICD-10-CM | POA: Insufficient documentation

## 2024-01-14 DIAGNOSIS — C184 Malignant neoplasm of transverse colon: Secondary | ICD-10-CM

## 2024-01-14 LAB — CEA (ACCESS): CEA (CHCC): 4.41 ng/mL (ref 0.00–5.00)

## 2024-01-14 NOTE — Progress Notes (Signed)
 [name] [name]   Diagnosis: Colon cancer  INTERVAL HISTORY:   [name] [name] returns as scheduled.  He feels well.  He underwent a colonoscopy 12/02/2023.  Erythema with exudates and erosion was found in the rectum and sigmoid colon up to 30 cm from the anus.  He was prescribed mesalamine and reports the bleeding has resolved.  Stool frequency has improved.  Objective:  Vital signs in last 24 hours:  Blood pressure 139/88, pulse 79, temperature 98.2 F (36.8 C), temperature source Oral, resp. rate 18, height 5' 4 (1.626 m), weight 192 lb (87.1 kg), SpO2 96%.    Lymphatics: No cervical, supraclavicular, axillary, or inguinal nodes Resp: Lungs clear bilaterally Cardio: Regular rate and rhythm GI: No hepatosplenomegaly, no mass, nontender Vascular: No leg edema  Lab Results:  Lab Results  Component Value Date   WBC 12.2 (H) 07/15/2023   HGB 15.9 07/15/2023   HCT 45.6 07/15/2023   MCV 94.6 07/15/2023   PLT 231 07/15/2023   NEUTROABS 10.5 (H) 07/15/2023    CMP  Lab Results  Component Value Date   NA 138 08/02/2023   K 4.1 08/02/2023   CL 103 08/02/2023   CO2 28 08/02/2023   GLUCOSE 123 (H) 08/02/2023   BUN 13 08/02/2023   CREATININE 0.95 08/02/2023   CALCIUM  9.8 08/02/2023   PROT 7.2 08/02/2023   ALBUMIN 4.1 08/02/2023   AST 23 08/02/2023   ALT 28 08/02/2023   ALKPHOS 61 08/02/2023   BILITOT 0.6 08/02/2023   GFRNONAA >60 08/02/2023   GFRAA >60 04/13/2018    Lab Results  Component Value Date   CEA 4.41 01/14/2024    Lab Results  Component Value Date   INR 1.1 02/20/2023   LABPROT 14.5 02/20/2023     Medications: I have reviewed the patient's current medications.   Assessment/Plan: Colon cancer, stage IIIb (pT4a, pN1a) Colonoscopy 10/10/2022 -circumferential fungating mass thought to represent the proximal descending colon at 70 cm from the anus that could not be passed with the scope.  Biopsy showed adenocarcinoma, at  least intramucosal; intact expression of all 4 mismatch repair proteins.   CT scans 10/17/2022-2 noncalcified nodules adjacent to the horizontal fissure within the right thorax between the right upper lobe and right middle lobe measuring 11 and 7 mm in size; no other pulmonary nodules; 10 mm right paratracheal lymph node liver was negative for suspicious findings; spleen with 2 small densities; a constricting lesion of the distal transverse colon suspected seen to the left of midline adjacent to the splenic flexure measuring 3.6 cm in length; significant narrowing of the lumen of the bowel at this location with bowel wall thickening; hazy stranding within the adjacent mesenteric fat; along the superior margin of the lesion and exophytic 12 mm density possibly representing local invasion and/or adjacent adenopathy.   CEA 10/17/2022 3.0 (normal range for non-smokers less than 3.0, smokers less than 5.0) Laparoscopic extended right hemicolectomy 11/05/2022 by Dr. Darral.  Final pathology showed 6 cm invasive moderately differentiated adenocarcinoma in the distal transverse colon, invading the visceral peritoneum; resection margins negative; positive lymphovascular and perineural invasion; 1 out of 26 lymph nodes positive for carcinoma; no tumor deposits identified; appendiceal serrated lesion with low-grade dysplasia; pT4a, pN1a Cycle 1 FOLFOX 12/19/2022 Cycle 2 FOLFOX 01/02/2023 Cycle 3 FOLFOX 01/16/2023 Cycle 4 FOLFOX 01/30/2023 Cycle 5 FOLFOX 02/13/2023-5-FU leucovorin  dose reduced secondary to diarrhea Cycle 6 FOLFOX 02/27/2023-5-FU bolus eliminated, 5-FU pump dose reduced due to diarrhea; oxaliplatin  dose reduced  due to thrombocytopenia Cycle 7 FOLFOX 03/13/2023-oxaliplatin  held due to persistent cold sensitivity Cycle 8 FOLFOX 03/27/2023-oxaliplatin  held due to neuropathy Cycle 9 FOLFOX 04/10/2023-oxaliplatin  held, 5-FU pump dose escalated Cycle 10 FOLFOX 04/24/2023-oxaliplatin  held due to neuropathy Cycle 11  FOLFOX 05/07/2023-oxaliplatin  held due to neuropathy Cycle 12 FOLFOX 05/22/2023-patient declined CT angiogram abdomen/pelvis 07/15/2023: SMV thrombus not identified, increased mesenteric collaterals, persistent mild wall thickening of the rectosigmoid, bilateral nonobstructing renal stones CT chest 10/08/2023: No evidence of metastatic disease, stable tiny perifissural nodules in the right lung Colonoscopy 12/02/2023: Erythema/exudate/erosions in the rectum up to 30 cm from the anus, biopsy-moderate chronic active colitis with erosion, prescribed mesalamine-rectal bleeding improved   Iron deficiency anemia-he reports receiving IV iron prior to the colon surgery 12/18/2022 ferritin 11, hemoglobin 11.1/MCV 79 12/18/2022 ferrous sulfate 1 tablet daily Hypertension History of kidney stones Colonoscopy 10/10/2022-mass could not be passed with the scope Port-A-Cath placement, Interventional Radiology, 12/17/2022 Admission 02/18/2023 with abdominal pain and diarrhea CT abdomen/pelvis 02/18/2023-multiple renal calculi, mucosa enhancement in the sigmoid colon, acute 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  Erythematous pruritic rash over the extremities following cycle 10 and cycle eleven 5-FU 10.  Pancreatitis 07/15/2023      Disposition: [name] [name] is in clinical remission from colon cancer.  A colonoscopy last month confirmed colitis at the rectosigmoid.  He was prescribed mesalamine.  Rectal bleeding has resolved.  The CEA is higher in the normal range today.  He will return for a CEA in 6 weeks.  He will be scheduled for surveillance CTs had an office visit in 3 months.  Arley Hof, MD  01/14/2024  10:13 AM

## 2024-01-15 ENCOUNTER — Telehealth: Payer: Self-pay | Admitting: Oncology

## 2024-01-15 NOTE — Telephone Encounter (Signed)
 Patient has been scheduled for follow-up visit per 01/14/24 LOS.  Pt aware of scheduled appt details.     Follow-up disposition: Return for Lab, office, Scan.  Check out comments: Lab 6 weeks  Lab, then CTs same day week of 10/6  Office 3-4 days after CTs

## 2024-01-16 ENCOUNTER — Other Ambulatory Visit: Payer: Self-pay

## 2024-01-28 ENCOUNTER — Emergency Department (HOSPITAL_BASED_OUTPATIENT_CLINIC_OR_DEPARTMENT_OTHER)

## 2024-01-28 ENCOUNTER — Other Ambulatory Visit: Payer: Self-pay

## 2024-01-28 ENCOUNTER — Emergency Department (HOSPITAL_BASED_OUTPATIENT_CLINIC_OR_DEPARTMENT_OTHER)
Admission: EM | Admit: 2024-01-28 | Discharge: 2024-01-28 | Disposition: A | Discharge status: Home / Self Care | Attending: Emergency Medicine | Admitting: Emergency Medicine

## 2024-01-28 DIAGNOSIS — K0889 Other specified disorders of teeth and supporting structures: Secondary | ICD-10-CM | POA: Diagnosis present

## 2024-01-28 DIAGNOSIS — K047 Periapical abscess without sinus: Secondary | ICD-10-CM | POA: Insufficient documentation

## 2024-01-28 LAB — BASIC METABOLIC PANEL WITH GFR
Anion gap: 12 (ref 5–15)
BUN: 11 mg/dL (ref 8–23)
CO2: 26 mmol/L (ref 22–32)
Calcium: 9.7 mg/dL (ref 8.9–10.3)
Chloride: 101 mmol/L (ref 98–111)
Creatinine, Ser: 0.83 mg/dL (ref 0.61–1.24)
GFR, Estimated: >60 mL/min
Glucose, Bld: 120 mg/dL — ABNORMAL HIGH (ref 70–99)
Potassium: 4 mmol/L (ref 3.5–5.1)
Sodium: 139 mmol/L (ref 135–145)

## 2024-01-28 LAB — CBC WITH DIFFERENTIAL/PLATELET
Abs Immature Granulocytes: 0.02 K/uL (ref 0.00–0.07)
Basophils Absolute: 0 K/uL (ref 0.0–0.1)
Basophils Relative: 1 %
Eosinophils Absolute: 0.8 K/uL — ABNORMAL HIGH (ref 0.0–0.5)
Eosinophils Relative: 10 %
HCT: 42.8 % (ref 39.0–52.0)
Hemoglobin: 15 g/dL (ref 13.0–17.0)
Immature Granulocytes: 0 %
Lymphocytes Relative: 12 %
Lymphs Abs: 0.9 K/uL (ref 0.7–4.0)
MCH: 35.8 pg — ABNORMAL HIGH (ref 26.0–34.0)
MCHC: 35 g/dL (ref 30.0–36.0)
MCV: 102.1 fL — ABNORMAL HIGH (ref 80.0–100.0)
Monocytes Absolute: 0.6 K/uL (ref 0.1–1.0)
Monocytes Relative: 8 %
Neutro Abs: 5.3 K/uL (ref 1.7–7.7)
Neutrophils Relative %: 69 %
Platelets: 161 K/uL (ref 150–400)
RBC: 4.19 MIL/uL — ABNORMAL LOW (ref 4.22–5.81)
RDW: 12.8 % (ref 11.5–15.5)
WBC: 7.6 K/uL (ref 4.0–10.5)
nRBC: 0 % (ref 0.0–0.2)

## 2024-01-28 MED ORDER — SODIUM CHLORIDE 0.9 % IV SOLN
3.0000 g | Freq: Once | INTRAVENOUS | Status: AC
  Administered 2024-01-28: 3 g via INTRAVENOUS

## 2024-01-28 MED ORDER — AMOXICILLIN-POT CLAVULANATE 875-125 MG PO TABS
1.0000 | ORAL_TABLET | Freq: Two times a day (BID) | ORAL | 0 refills | Status: AC
Start: 2024-01-29 — End: 2024-02-05

## 2024-01-28 MED ORDER — KETOROLAC TROMETHAMINE 30 MG/ML IJ SOLN
30.0000 mg | Freq: Once | INTRAMUSCULAR | Status: AC
  Administered 2024-01-28: 30 mg via INTRAVENOUS
  Filled 2024-01-28: qty 1

## 2024-01-28 MED ORDER — OXYCODONE HCL 5 MG PO TABS: 5.0000 mg | ORAL_TABLET | ORAL | 0 refills | Status: DC | PRN

## 2024-01-28 MED ORDER — IOHEXOL 300 MG/ML  SOLN
75.0000 mL | Freq: Once | INTRAMUSCULAR | Status: AC | PRN
  Administered 2024-01-28: 75 mL via INTRAVENOUS

## 2024-01-28 MED ORDER — MORPHINE SULFATE (PF) 4 MG/ML IV SOLN
4.0000 mg | Freq: Once | INTRAVENOUS | Status: AC
  Administered 2024-01-28: 4 mg via INTRAVENOUS
  Filled 2024-01-28: qty 1

## 2024-01-28 MED ORDER — DEXAMETHASONE SODIUM PHOSPHATE 10 MG/ML IJ SOLN
10.0000 mg | Freq: Once | INTRAMUSCULAR | Status: AC
  Administered 2024-01-28: 10 mg via INTRAVENOUS
  Filled 2024-01-28: qty 1

## 2024-01-28 MED ORDER — HYDROMORPHONE HCL 1 MG/ML IJ SOLN
1.0000 mg | Freq: Once | INTRAMUSCULAR | Status: AC
  Administered 2024-01-28: 1 mg via INTRAVENOUS
  Filled 2024-01-28: qty 1

## 2024-01-28 NOTE — ED Notes (Signed)
 Reviewed AVS/discharge instruction with patient. Time allotted for and all questions answered. Patient is agreeable for d/c and escorted to ed exit by staff.

## 2024-01-28 NOTE — ED Provider Notes (Signed)
 [place] EMERGENCY DEPARTMENT AT [facility] Provider Note   CSN: [id number] Arrival date & time: 01/28/24  1525     Patient presents with: Facial Swelling   [name] [name] is a 63 y.o. male here with right sided dental and jaw pain and facial swelling, onset 3 days ago.  Went to dentist today, started on amoxicillin  and has taken 2 doses.  Reports pain, swelling in his neck.   HPI     Prior to Admission medications   Medication Sig Start Date End Date Taking? Authorizing Provider  acetaminophen -codeine (TYLENOL  #3) 300-30 MG tablet Take 1-2 tablets by mouth every 8 (eight) hours as needed for severe pain (pain score 7-10). 01/27/24  Yes [provider]  amoxicillin  (AMOXIL ) 500 MG capsule  01/27/24  Yes [provider]  amoxicillin -clavulanate (AUGMENTIN ) 875-125 MG tablet Take 1 tablet by mouth every 12 (twelve) hours for 7 days. 01/29/24 02/05/24 Yes Mia Milan, Donnice PARAS, MD  oxyCODONE  (ROXICODONE ) 5 MG immediate release tablet Take 1 tablet (5 mg total) by mouth every 4 (four) hours as needed for up to 15 doses for severe pain (pain score 7-10). 01/28/24  Yes Philip Kotlyar, Donnice PARAS, MD  diphenoxylate -atropine  (LOMOTIL ) 2.5-0.025 MG tablet Take 1-2 tablets by mouth 4 (four) times daily as needed for diarrhea or loose stools (Maximum of 8/day). 02/04/23   Cloretta Arley NOVAK, MD  loperamide  (IMODIUM  A-D) 2 MG tablet Take 2 mg by mouth 4 (four) times daily as needed for diarrhea or loose stools.    [provider]  mesalamine (LIALDA) 1.2 g EC tablet Take 4.8 g by mouth daily.    [provider]  ondansetron  (ZOFRAN -ODT) 4 MG disintegrating tablet 4mg  ODT q4 hours prn nausea/vomit Patient not taking: Reported on 01/14/2024 07/15/23   Emil Share, DO  promethazine  (PHENERGAN ) 12.5 MG tablet Take 1 tablet (12.5 mg total) by mouth every 6 (six) hours as needed for nausea or vomiting. Patient not taking: Reported on 01/14/2024 12/18/22   Debby Olam POUR, NP  tamsulosin   (FLOMAX ) 0.4 MG CAPS capsule Take 1 capsule (0.4 mg total) by mouth 2 (two) times daily. 04/13/18   Vicky Charleston, PA-C    Allergies: Compazine [prochlorperazine edisylate] and Prochlorperazine    Review of Systems  Updated Vital Signs BP (!) 163/98   Pulse 74   Temp 98.3 F (36.8 C) (Oral)   Resp 16   SpO2 96%   Physical Exam Constitutional:      General: He is not in acute distress. HENT:     Head: Normocephalic and atraumatic.     Comments: Right upper and lower molar dental cavity Swelling of the angle of the mandible right sided, parotid gland tenderness, lymphadenopathy Voice is clear, nonmuffled, no drooling, no tongue swelling or elevation Eyes:     Conjunctiva/sclera: Conjunctivae normal.     Pupils: Pupils are equal, round, and reactive to light.  Cardiovascular:     Rate and Rhythm: Normal rate and regular rhythm.  Pulmonary:     Effort: Pulmonary effort is normal. No respiratory distress.  Abdominal:     General: There is no distension.     Tenderness: There is no abdominal tenderness.  Skin:    General: Skin is warm and dry.  Neurological:     General: No focal deficit present.     Mental Status: He is alert. Mental status is at baseline.  Psychiatric:        Mood and Affect: Mood normal.  Behavior: Behavior normal.     (all labs ordered are listed, but only abnormal results are displayed) Labs Reviewed  BASIC METABOLIC PANEL WITH GFR - Abnormal; Notable for the following components:      Result Value   Glucose, Bld 120 (*)    All other components within normal limits  CBC WITH DIFFERENTIAL/PLATELET - Abnormal; Notable for the following components:   RBC 4.19 (*)    MCV 102.1 (*)    MCH 35.8 (*)    Eosinophils Absolute 0.8 (*)    All other components within normal limits    EKG: None  Radiology: CT Soft Tissue Neck W Contrast Result Date: 01/28/2024 CLINICAL DATA:  Provided history: Soft tissue infection suspected, neck, x-ray done.  Concern for peridental abscess and cellulitis or parotitis (right side), swelling at angle of jaw and of right neck. EXAM: CT NECK WITH CONTRAST TECHNIQUE: Multidetector CT imaging of the neck was performed using the standard protocol following the bolus administration of intravenous contrast. RADIATION DOSE REDUCTION: This exam was performed according to the departmental dose-optimization program which includes automated exposure control, adjustment of the mA and/or kV according to patient size and/or use of iterative reconstruction technique. CONTRAST:  75mL OMNIPAQUE  IOHEXOL  300 MG/ML  SOLN COMPARISON:  None. FINDINGS: Pharynx and larynx: Streak/beam hardening artifact arising from dental restoration partially obscures the oral cavity. No appreciable swelling or mass within the oral cavity, pharynx or larynx. Salivary glands: No inflammation, mass, or stone. Thyroid: Unremarkable. Lymph nodes: There is a right level IB lymph node which is asymmetrically prominent, although not technically enlarged by short axis criteria (measuring 6 mm in short axis). This is likely reactive. No pathologically enlarged lymph nodes identified elsewhere. Vascular: The major vascular structures of the neck are patent. Atherosclerotic plaque within the aortic arch, proximal major branch vessels of the neck and within the intracranial vertebral arteries. Limited intracranial: No evidence of an acute intracranial abnormality within the field of view. Visualized orbits: Incompletely imaged. No orbital mass or acute orbital finding. Mastoids and visualized paranasal sinuses: Mild mucosal thickening within the right maxillary sinus. Trace mucosal thickening within bilateral ethmoid air cells. Please note, portions of the frontal sinuses are excluded from the field of view superiorly. No significant mastoid effusion. Skeleton: Cervical spondylosis. Developmental non-segmentation of the C6-C7 vertebrae. Mild chronic T5 superior endplate  vertebral compression deformity. Dental/periodontal disease as described below. Upper chest: No consolidation within the lungs at the imaged levels. Other: Edema/stranding within the right perimandibular soft tissues, extending inferiorly to the level of the mid neck. No soft tissue abscess is identified. There is poor dentition with dental and periodontal disease. In the region of interest, there is periapical lucency surrounding the right mandibular second molar tooth (which is compatible with. All disease and could potentially reflect a periapical abscess)(for instance as seen on series 5, image 74). IMPRESSION: 1. Edema/stranding within the right perimandibular soft tissues, and extending inferiorly to the level of the mid neck, likely reflecting cellulitis. This may be odontogenic in origin as there is periapical lucency surrounding the adjacent right mandibular second molar tooth (which is consistent with periodontal disease and could potentially reflect a periapical abscess). No soft tissue abscess is identified. Background poor dentition. 2. Asymmetrically prominent (although not technically enlarged) right level IB lymph node, likely reactive. 3. Mild chronic T5 superior endplate vertebral compression deformity. 4. Developmental non-segmentation of the C6-C7 vertebrae. 5. Cervical spondylosis. Electronically Signed   By: Rockey Childs D.O.   On: 01/28/2024  17:21     .Critical Care  Performed by: Cottie Donnice PARAS, MD Authorized by: Cottie Donnice PARAS, MD   Critical care provider statement:    Critical care time (minutes):  30   Critical care time was exclusive of:  Separately billable procedures and treating other patients   Critical care was time spent personally by me on the following activities:  Ordering and performing treatments and interventions, ordering and review of laboratory studies, ordering and review of radiographic studies, pulse oximetry, review of old charts, examination of patient  and evaluation of patient's response to treatment Comments:     Multiple doses of analgesia required    Medications Ordered in the ED  Ampicillin -Sulbactam (UNASYN ) 3 g in sodium chloride  0.9 % 100 mL IVPB (has no administration in time range)  dexamethasone  (DECADRON ) injection 10 mg (has no administration in time range)  ketorolac  (TORADOL ) 30 MG/ML injection 30 mg (has no administration in time range)  HYDROmorphone  (DILAUDID ) injection 1 mg (has no administration in time range)  morphine  (PF) 4 MG/ML injection 4 mg (4 mg Intravenous Given 01/28/24 1601)  iohexol  (OMNIPAQUE ) 300 MG/ML solution 75 mL (75 mLs Intravenous Contrast Given 01/28/24 1658)                                    Medical Decision Making Amount and/or Complexity of Data Reviewed Labs: ordered. Radiology: ordered.  Risk Prescription drug management.   Facial swelling and pain  Ddx includes infection/abscess vs parotitis vs other  Airway patent on arrival No fever - doubt sepsis  Pending labs, CT imaging IV pain medicine ordered  Labs and imaging reviewed - no leukocytopsis, no large drainable abscess; likely cellulitis and lymphadenopathy  Will give iV unasyn  here, suspected dental source - switch patient to Augmentin  at home.  IV decadron  and toradol  given for pain and inflammation; IV dilaudid .  He has an oral surgery appointment, but he understands need to return to hospital if not improving at home.     Final diagnoses:  Dental infection    ED Discharge Orders          Ordered    amoxicillin -clavulanate (AUGMENTIN ) 875-125 MG tablet  Every 12 hours        01/28/24 1746    oxyCODONE  (ROXICODONE ) 5 MG immediate release tablet  Every 4 hours PRN        01/28/24 1746               Hind Chesler, Donnice PARAS, MD 01/28/24 1749

## 2024-01-28 NOTE — Discharge Instructions (Addendum)
 Stop taking the amoxicillin  prescribed by your dentist, and start taking Augmentin  tomorrow morning (a stronger antibiotic).  If your pain and swelling is getting worse over the next 48 hours - or you are unable to drink or eat, or feel your tongue or throat are swelling - please return immediately to the ER.

## 2024-01-28 NOTE — ED Triage Notes (Addendum)
 Patient states right-sided facial swelling beginning on Saturday. States saw dentist today and was told to come to ER due to concern for severe infection. Now reports some difficulty swallowing.

## 2024-02-25 ENCOUNTER — Inpatient Hospital Stay: Attending: Nurse Practitioner

## 2024-02-25 ENCOUNTER — Ambulatory Visit: Payer: Self-pay | Admitting: Oncology

## 2024-02-25 DIAGNOSIS — C184 Malignant neoplasm of transverse colon: Secondary | ICD-10-CM | POA: Diagnosis present

## 2024-02-25 DIAGNOSIS — Z79899 Other long term (current) drug therapy: Secondary | ICD-10-CM | POA: Diagnosis not present

## 2024-02-25 LAB — CEA (ACCESS): CEA (CHCC): 2.44 ng/mL (ref 0.00–5.00)

## 2024-04-09 ENCOUNTER — Telehealth: Payer: Self-pay | Admitting: Nurse Practitioner

## 2024-04-09 ENCOUNTER — Telehealth: Payer: Self-pay | Admitting: *Deleted

## 2024-04-09 ENCOUNTER — Encounter (HOSPITAL_BASED_OUTPATIENT_CLINIC_OR_DEPARTMENT_OTHER): Payer: Self-pay

## 2024-04-09 ENCOUNTER — Inpatient Hospital Stay: Admitting: Oncology

## 2024-04-09 NOTE — Telephone Encounter (Signed)
 Called PT to schedule scan review appt. Left detailed message on PT voicemail.

## 2024-04-09 NOTE — Telephone Encounter (Signed)
 Scheduled CT scan at [facility] for 10/13 at 5:30 pm. Will  need to check in at 3:15 pm to start oral contrast. Lab at [facility] at 3:00 pm.  Left this on his voice mail and sent MyChart message. [facility] scheduling message sent to schedule scan review appointment.

## 2024-04-10 ENCOUNTER — Other Ambulatory Visit: Payer: Self-pay

## 2024-04-13 ENCOUNTER — Ambulatory Visit (HOSPITAL_BASED_OUTPATIENT_CLINIC_OR_DEPARTMENT_OTHER)
Admission: RE | Admit: 2024-04-13 | Discharge: 2024-04-13 | Disposition: A | Discharge status: Home / Self Care | Source: Ambulatory Visit | Attending: Oncology | Admitting: Oncology

## 2024-04-13 ENCOUNTER — Inpatient Hospital Stay: Attending: Nurse Practitioner

## 2024-04-13 DIAGNOSIS — K625 Hemorrhage of anus and rectum: Secondary | ICD-10-CM | POA: Insufficient documentation

## 2024-04-13 DIAGNOSIS — Z85038 Personal history of other malignant neoplasm of large intestine: Secondary | ICD-10-CM | POA: Insufficient documentation

## 2024-04-13 DIAGNOSIS — G629 Polyneuropathy, unspecified: Secondary | ICD-10-CM | POA: Diagnosis not present

## 2024-04-13 DIAGNOSIS — I700 Atherosclerosis of aorta: Secondary | ICD-10-CM | POA: Insufficient documentation

## 2024-04-13 DIAGNOSIS — Z87442 Personal history of urinary calculi: Secondary | ICD-10-CM | POA: Diagnosis not present

## 2024-04-13 DIAGNOSIS — Z8719 Personal history of other diseases of the digestive system: Secondary | ICD-10-CM | POA: Insufficient documentation

## 2024-04-13 DIAGNOSIS — Z9049 Acquired absence of other specified parts of digestive tract: Secondary | ICD-10-CM | POA: Insufficient documentation

## 2024-04-13 DIAGNOSIS — Z79899 Other long term (current) drug therapy: Secondary | ICD-10-CM | POA: Diagnosis not present

## 2024-04-13 DIAGNOSIS — D696 Thrombocytopenia, unspecified: Secondary | ICD-10-CM | POA: Diagnosis not present

## 2024-04-13 DIAGNOSIS — C184 Malignant neoplasm of transverse colon: Secondary | ICD-10-CM | POA: Insufficient documentation

## 2024-04-13 DIAGNOSIS — D509 Iron deficiency anemia, unspecified: Secondary | ICD-10-CM | POA: Diagnosis not present

## 2024-04-13 DIAGNOSIS — L299 Pruritus, unspecified: Secondary | ICD-10-CM | POA: Diagnosis not present

## 2024-04-13 DIAGNOSIS — K859 Acute pancreatitis without necrosis or infection, unspecified: Secondary | ICD-10-CM | POA: Diagnosis not present

## 2024-04-13 DIAGNOSIS — N200 Calculus of kidney: Secondary | ICD-10-CM | POA: Insufficient documentation

## 2024-04-13 DIAGNOSIS — I10 Essential (primary) hypertension: Secondary | ICD-10-CM | POA: Diagnosis not present

## 2024-04-13 LAB — BASIC METABOLIC PANEL - CANCER CENTER ONLY
Anion gap: 14 (ref 5–15)
BUN: 6 mg/dL — ABNORMAL LOW (ref 8–23)
CO2: 22 mmol/L (ref 22–32)
Calcium: 9.6 mg/dL (ref 8.9–10.3)
Chloride: 107 mmol/L (ref 98–111)
Creatinine: 0.91 mg/dL (ref 0.61–1.24)
GFR, Estimated: >60 mL/min (ref >60)
Glucose, Bld: 104 mg/dL — ABNORMAL HIGH (ref 70–99)
Potassium: 3.7 mmol/L (ref 3.5–5.1)
Sodium: 142 mmol/L (ref 135–145)

## 2024-04-13 LAB — CEA (ACCESS): CEA (CHCC): 2.47 ng/mL (ref 0.00–5.00)

## 2024-04-13 MED ORDER — IOHEXOL 300 MG/ML  SOLN
100.0000 mL | Freq: Once | INTRAMUSCULAR | Status: AC | PRN
  Administered 2024-04-13: 100 mL via INTRAVENOUS

## 2024-04-16 ENCOUNTER — Encounter: Payer: Self-pay | Admitting: Nurse Practitioner

## 2024-04-16 ENCOUNTER — Encounter: Payer: Self-pay | Admitting: *Deleted

## 2024-04-16 ENCOUNTER — Inpatient Hospital Stay (HOSPITAL_BASED_OUTPATIENT_CLINIC_OR_DEPARTMENT_OTHER): Admitting: Nurse Practitioner

## 2024-04-16 VITALS — BP 150/98 | HR 91 | Temp 98.1°F | Resp 18 | Ht 64.0 in | Wt 184.6 lb

## 2024-04-16 DIAGNOSIS — C184 Malignant neoplasm of transverse colon: Secondary | ICD-10-CM | POA: Diagnosis not present

## 2024-04-16 NOTE — Progress Notes (Signed)
 [name] [name]   Diagnosis: Colon cancer  INTERVAL HISTORY:   [name] [name] returns as scheduled.  He feels well.  He has a good appetite.  No abdominal pain.  No change in baseline bowel habits.  He occasionally notes blood after straining for a bowel movement but notes significant improvement since beginning mesalamine.  No pain with bowel movements.  No dysphagia.  No reflux symptoms.  Objective:  Vital signs in last 24 hours:  Blood pressure (!) 150/98, pulse 91, temperature 98.1 F (36.7 C), temperature source Temporal, resp. rate 18, height 5' 4 (1.626 m), weight 184 lb 9.6 oz (83.7 kg), SpO2 98%.    HEENT: No thrush or ulcers. Lymphatics: No palpable cervical, supraclavicular, axillary or inguinal lymph nodes. Resp: Lungs clear bilaterally. Cardio: Regular rate and rhythm. GI: No hepatosplenomegaly.  Nontender. Vascular: No leg edema.   Lab Results:  Lab Results  Component Value Date   WBC 7.6 01/28/2024   HGB 15.0 01/28/2024   HCT 42.8 01/28/2024   MCV 102.1 (H) 01/28/2024   PLT 161 01/28/2024   NEUTROABS 5.3 01/28/2024    Imaging:  No results found.  Medications: I have reviewed the patient's current medications.  Assessment/Plan: Colon cancer, stage IIIb (pT4a, pN1a) Colonoscopy 10/10/2022 -circumferential fungating mass thought to represent the proximal descending colon at 70 cm from the anus that could not be passed with the scope.  Biopsy showed adenocarcinoma, at least intramucosal; intact expression of all 4 mismatch repair proteins.   CT scans 10/17/2022-2 noncalcified nodules adjacent to the horizontal fissure within the right thorax between the right upper lobe and right middle lobe measuring 11 and 7 mm in size; no other pulmonary nodules; 10 mm right paratracheal lymph node liver was negative for suspicious findings; spleen with 2 small densities; a constricting lesion of the distal transverse colon suspected seen to the  left of midline adjacent to the splenic flexure measuring 3.6 cm in length; significant narrowing of the lumen of the bowel at this location with bowel wall thickening; hazy stranding within the adjacent mesenteric fat; along the superior margin of the lesion and exophytic 12 mm density possibly representing local invasion and/or adjacent adenopathy.   CEA 10/17/2022 3.0 (normal range for non-smokers less than 3.0, smokers less than 5.0) Laparoscopic extended right hemicolectomy 11/05/2022 by Dr. Darral.  Final pathology showed 6 cm invasive moderately differentiated adenocarcinoma in the distal transverse colon, invading the visceral peritoneum; resection margins negative; positive lymphovascular and perineural invasion; 1 out of 26 lymph nodes positive for carcinoma; no tumor deposits identified; appendiceal serrated lesion with low-grade dysplasia; pT4a, pN1a Cycle 1 FOLFOX 12/19/2022 Cycle 2 FOLFOX 01/02/2023 Cycle 3 FOLFOX 01/16/2023 Cycle 4 FOLFOX 01/30/2023 Cycle 5 FOLFOX 02/13/2023-5-FU leucovorin  dose reduced secondary to diarrhea Cycle 6 FOLFOX 02/27/2023-5-FU bolus eliminated, 5-FU pump dose reduced due to diarrhea; oxaliplatin  dose reduced due to thrombocytopenia Cycle 7 FOLFOX 03/13/2023-oxaliplatin  held due to persistent cold sensitivity Cycle 8 FOLFOX 03/27/2023-oxaliplatin  held due to neuropathy Cycle 9 FOLFOX 04/10/2023-oxaliplatin  held, 5-FU pump dose escalated Cycle 10 FOLFOX 04/24/2023-oxaliplatin  held due to neuropathy Cycle 11 FOLFOX 05/07/2023-oxaliplatin  held due to neuropathy Cycle 12 FOLFOX 05/22/2023-patient declined CT angiogram abdomen/pelvis 07/15/2023: SMV thrombus not identified, increased mesenteric collaterals, persistent mild wall thickening of the rectosigmoid, bilateral nonobstructing renal stones CT chest 10/08/2023: No evidence of metastatic disease, stable tiny perifissural nodules in the right lung Colonoscopy 12/02/2023: Erythema/exudate/erosions in the rectum up to 30 cm from  the anus, biopsy-moderate chronic active colitis  with erosion, prescribed mesalamine-rectal bleeding improved CTs 04/13/2024-stable small pulmonary nodules measuring up to 6 mm, favored benign.  Mild symmetric distal esophageal wall thickening with a prominent distal paraesophageal lymph node measuring 9 mm, previously measured 7 mm.   Iron deficiency anemia-he reports receiving IV iron prior to the colon surgery 12/18/2022 ferritin 11, hemoglobin 11.1/MCV 79 12/18/2022 ferrous sulfate 1 tablet daily Hypertension History of kidney stones Colonoscopy 10/10/2022-mass could not be passed with the scope Port-A-Cath placement, Interventional Radiology, 12/17/2022 Admission 02/18/2023 with abdominal pain and diarrhea CT abdomen/pelvis 02/18/2023-multiple renal calculi, mucosa enhancement in the sigmoid colon, acute 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  Erythematous pruritic rash over the extremities following cycle 10 and cycle eleven 5-FU 10.  Pancreatitis 07/15/2023    Disposition: [name] [name] remains in clinical remission from colon cancer.  Recent CEA was stable in normal range.  CTs 04/13/2024 showed stable small pulmonary nodules favored to be benign.  Mild symmetric distal esophageal wall thickening with a prominent distal paraesophageal lymph node noted.  Defer further evaluation to gastroenterology.  He has an appointment with Dr. Emeline next week.  We will alert his office of the CT findings.  [name] [name] will return for a CEA and follow-up visit in 3 months.  We are available to see him sooner if needed.    Olam Ned ANP/GNP-BC   04/16/2024  9:23 AM

## 2024-04-16 NOTE — Progress Notes (Signed)
 Mr. Boeke has appointment with Dr. Selinda Southgate with [facility] at [facility] on 10/23. Faxed today's office note, recent CT scan and referral order to office.

## 2024-04-17 ENCOUNTER — Other Ambulatory Visit: Payer: Self-pay

## 2024-06-03 ENCOUNTER — Other Ambulatory Visit: Payer: Self-pay

## 2024-07-16 ENCOUNTER — Ambulatory Visit: Payer: Self-pay | Admitting: Oncology

## 2024-07-16 ENCOUNTER — Inpatient Hospital Stay

## 2024-07-16 ENCOUNTER — Encounter: Payer: Self-pay | Admitting: *Deleted

## 2024-07-16 ENCOUNTER — Inpatient Hospital Stay: Admitting: Oncology

## 2024-07-16 ENCOUNTER — Inpatient Hospital Stay: Attending: Nurse Practitioner

## 2024-07-16 VITALS — BP 134/99 | HR 83 | Temp 98.2°F | Resp 18 | Ht 64.0 in | Wt 185.9 lb

## 2024-07-16 DIAGNOSIS — C184 Malignant neoplasm of transverse colon: Secondary | ICD-10-CM

## 2024-07-16 LAB — CBC WITH DIFFERENTIAL (CANCER CENTER ONLY)
Abs Immature Granulocytes: 0.01 K/uL (ref 0.00–0.07)
Basophils Absolute: 0 K/uL (ref 0.0–0.1)
Basophils Relative: 1 %
Eosinophils Absolute: 0.5 K/uL (ref 0.0–0.5)
Eosinophils Relative: 8 %
HCT: 47.1 % (ref 39.0–52.0)
Hemoglobin: 16.6 g/dL (ref 13.0–17.0)
Immature Granulocytes: 0 %
Lymphocytes Relative: 20 %
Lymphs Abs: 1.1 K/uL (ref 0.7–4.0)
MCH: 35 pg — ABNORMAL HIGH (ref 26.0–34.0)
MCHC: 35.2 g/dL (ref 30.0–36.0)
MCV: 99.4 fL (ref 80.0–100.0)
Monocytes Absolute: 0.4 K/uL (ref 0.1–1.0)
Monocytes Relative: 6 %
Neutro Abs: 3.7 K/uL (ref 1.7–7.7)
Neutrophils Relative %: 65 %
Platelet Count: 180 K/uL (ref 150–400)
RBC: 4.74 MIL/uL (ref 4.22–5.81)
RDW: 13.6 % (ref 11.5–15.5)
WBC Count: 5.7 K/uL (ref 4.0–10.5)
nRBC: 0 % (ref 0.0–0.2)

## 2024-07-16 LAB — CMP (CANCER CENTER ONLY)
ALT: 117 U/L — ABNORMAL HIGH (ref 0–44)
AST: 125 U/L — ABNORMAL HIGH (ref 15–41)
Albumin: 4.6 g/dL (ref 3.5–5.0)
Alkaline Phosphatase: 86 U/L (ref 38–126)
Anion gap: 15 (ref 5–15)
BUN: 7 mg/dL — ABNORMAL LOW (ref 8–23)
CO2: 24 mmol/L (ref 22–32)
Calcium: 9.7 mg/dL (ref 8.9–10.3)
Chloride: 103 mmol/L (ref 98–111)
Creatinine: 0.91 mg/dL (ref 0.61–1.24)
GFR, Estimated: >60 mL/min
Glucose, Bld: 142 mg/dL — ABNORMAL HIGH (ref 70–99)
Potassium: 3.9 mmol/L (ref 3.5–5.1)
Sodium: 142 mmol/L (ref 135–145)
Total Bilirubin: 0.6 mg/dL (ref 0.0–1.2)
Total Protein: 8 g/dL (ref 6.5–8.1)

## 2024-07-16 LAB — AMYLASE: Amylase: 80 U/L (ref 28–100)

## 2024-07-16 LAB — CEA (ACCESS): CEA (CHCC): 3.03 ng/mL (ref 0.00–5.00)

## 2024-07-16 LAB — LIPASE, BLOOD: Lipase: 58 U/L — ABNORMAL HIGH (ref 11–51)

## 2024-07-16 MED ORDER — OXYCODONE HCL 5 MG PO TABS: 5.0000 mg | ORAL_TABLET | ORAL | 0 refills | Status: AC | PRN

## 2024-07-16 NOTE — Progress Notes (Signed)
 " [name] [name]   Diagnosis: Colon cancer  INTERVAL HISTORY:   [name] [name] returns for a scheduled visit.  He reports feeling well until approximately 4 days ago when he developed pain in the left upper lateral abdomen and left flank region.  He feels that he has pancreatitis.  The pain is similar to when he has had pancreatitis in the past.  He was drinking beer and tomato juice prior to the onset of pain 4 days ago.  He had an episode of nausea and vomiting yesterday.  He is tolerating liquids.  No difficulty with bowel function.  No dysuria.  No bleeding.  He has pain with inspiration.  No cough.  Objective:  Vital signs in last 24 hours:  Blood pressure (!) 134/99, pulse 83, temperature 98.2 F (36.8 C), temperature source Temporal, resp. rate 18, height 5' 4 (1.626 m), weight 185 lb 14.4 oz (84.3 kg), SpO2 98%.    Lymphatics: No cervical, supraclavicular, axillary, or inguinal nodes Resp: Lungs clear bilaterally Cardio: Regular rate and rhythm GI: No hepatosplenomegaly, nontender, no mass Vascular: No leg edema Musculoskeletal: Tender to percussion at the left flank Skin: Small ecchymosis at the left lateral chest wall  Lab Results:  Lab Results  Component Value Date   WBC 7.6 01/28/2024   HGB 15.0 01/28/2024   HCT 42.8 01/28/2024   MCV 102.1 (H) 01/28/2024   PLT 161 01/28/2024   NEUTROABS 5.3 01/28/2024    CMP  Lab Results  Component Value Date   NA 142 04/13/2024   K 3.7 04/13/2024   CL 107 04/13/2024   CO2 22 04/13/2024   GLUCOSE 104 (H) 04/13/2024   BUN 6 (L) 04/13/2024   CREATININE 0.91 04/13/2024   CALCIUM  9.6 04/13/2024   PROT 7.2 08/02/2023   ALBUMIN 4.1 08/02/2023   AST 23 08/02/2023   ALT 28 08/02/2023   ALKPHOS 61 08/02/2023   BILITOT 0.6 08/02/2023   GFRNONAA >60 04/13/2024   GFRAA >60 04/13/2018    Lab Results  Component Value Date   CEA 2.47 04/13/2024    Lab Results  Component Value Date   INR 1.1  02/20/2023   LABPROT 14.5 02/20/2023    Imaging:  No results found.  Medications: I have reviewed the patient's current medications.   Assessment/Plan: Colon cancer, stage IIIb (pT4a, pN1a) Colonoscopy 10/10/2022 -circumferential fungating mass thought to represent the proximal descending colon at 70 cm from the anus that could not be passed with the scope.  Biopsy showed adenocarcinoma, at least intramucosal; intact expression of all 4 mismatch repair proteins.   CT scans 10/17/2022-2 noncalcified nodules adjacent to the horizontal fissure within the right thorax between the right upper lobe and right middle lobe measuring 11 and 7 mm in size; no other pulmonary nodules; 10 mm right paratracheal lymph node liver was negative for suspicious findings; spleen with 2 small densities; a constricting lesion of the distal transverse colon suspected seen to the left of midline adjacent to the splenic flexure measuring 3.6 cm in length; significant narrowing of the lumen of the bowel at this location with bowel wall thickening; hazy stranding within the adjacent mesenteric fat; along the superior margin of the lesion and exophytic 12 mm density possibly representing local invasion and/or adjacent adenopathy.   CEA 10/17/2022 3.0 (normal range for non-smokers less than 3.0, smokers less than 5.0) Laparoscopic extended right hemicolectomy 11/05/2022 by Dr. Darral.  Final pathology showed 6 cm invasive moderately differentiated adenocarcinoma in  the distal transverse colon, invading the visceral peritoneum; resection margins negative; positive lymphovascular and perineural invasion; 1 out of 26 lymph nodes positive for carcinoma; no tumor deposits identified; appendiceal serrated lesion with low-grade dysplasia; pT4a, pN1a Cycle 1 FOLFOX 12/19/2022 Cycle 2 FOLFOX 01/02/2023 Cycle 3 FOLFOX 01/16/2023 Cycle 4 FOLFOX 01/30/2023 Cycle 5 FOLFOX 02/13/2023-5-FU leucovorin  dose reduced secondary to diarrhea Cycle 6 FOLFOX  02/27/2023-5-FU bolus eliminated, 5-FU pump dose reduced due to diarrhea; oxaliplatin  dose reduced due to thrombocytopenia Cycle 7 FOLFOX 03/13/2023-oxaliplatin  held due to persistent cold sensitivity Cycle 8 FOLFOX 03/27/2023-oxaliplatin  held due to neuropathy Cycle 9 FOLFOX 04/10/2023-oxaliplatin  held, 5-FU pump dose escalated Cycle 10 FOLFOX 04/24/2023-oxaliplatin  held due to neuropathy Cycle 11 FOLFOX 05/07/2023-oxaliplatin  held due to neuropathy Cycle 12 FOLFOX 05/22/2023-patient declined CT angiogram abdomen/pelvis 07/15/2023: SMV thrombus not identified, increased mesenteric collaterals, persistent mild wall thickening of the rectosigmoid, bilateral nonobstructing renal stones CT chest 10/08/2023: No evidence of metastatic disease, stable tiny perifissural nodules in the right lung Colonoscopy 12/02/2023: Erythema/exudate/erosions in the rectum up to 30 cm from the anus, biopsy-moderate chronic active colitis with erosion, prescribed mesalamine-rectal bleeding improved CTs 04/13/2024-stable small pulmonary nodules measuring up to 6 mm, favored benign.  Mild symmetric distal esophageal wall thickening with a prominent distal paraesophageal lymph node measuring 9 mm, previously measured 7 mm.   Iron deficiency anemia-he reports receiving IV iron prior to the colon surgery 12/18/2022 ferritin 11, hemoglobin 11.1/MCV 79 12/18/2022 ferrous sulfate 1 tablet daily Hypertension History of kidney stones Colonoscopy 10/10/2022-mass could not be passed with the scope Port-A-Cath placement, Interventional Radiology, 12/17/2022 Admission 02/18/2023 with abdominal pain and diarrhea CT abdomen/pelvis 02/18/2023-multiple renal calculi, mucosa enhancement in the sigmoid colon, acute 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  Erythematous pruritic rash  over the extremities following cycle 10 and cycle eleven 5-FU 10.  Pancreatitis 07/15/2023     Disposition: [name] [name] has a history of colon cancer.  He is in clinical remission.  He did not see Dr. Montey as scheduled in October of last year.  He presents today routine follow-up and complains of severe pain in the left abdomen and flank areas.  He has a history of pancreatitis and believes the current pain is related to pancreatitis.  He continues alcohol use.  We will check a CBC, CMP, amylase, lipase, and urinalysis today.  We will contact his gastroenterologist to see if he can be seen today.  I recommended he follow a liquid diet.  He will use oxycodone  as needed for pain.  He knows to not drive while taking oxycodone .  I recommend he go to the emergency room if he is unable to tolerate liquids or if the pain does not improve over the next few days.  [name] [name] will return for an office visit and CEA in 3 months.  Arley Hof, MD  07/16/2024  8:38 AM   "

## 2024-07-16 NOTE — Progress Notes (Signed)
 Faxed labs to Dr Emeline with [facility] [phone].

## 2024-07-16 NOTE — Progress Notes (Signed)
 Attempted to reach Dr Dorita office to get an appointment for the patient for suspected pancreatis.  Spoke with Dr Dorita nurse at [facility] and she advised that the patient hasn't been seen since 2016 therefore he would become a new patient if seen again.  Per her nurse manager and unavailable slots and new patient status she is going to give the patient a call and triage him and determine how to proceed after speaking with him regarding his current issue.  I advised the patient is reporting pain 10 out of 10.  She stated that with that rating of pain she might end up sending him to the ER.     Notified Dr Cloretta

## 2024-07-17 ENCOUNTER — Other Ambulatory Visit: Payer: Self-pay

## 2024-07-21 NOTE — Telephone Encounter (Signed)
 Per Dr. Cloretta, called pt with message below. Pt verbalized understanding and labs sent to GI doctor.

## 2024-07-21 NOTE — Telephone Encounter (Signed)
-----   Message from Arley Hof, MD sent at 07/16/2024  5:50 PM EST ----- Please call patient, the liver enzymes and lipase are elevated, consistent with pancreatitis, he should go to the emergency room if his pain is not improved, forward the labs to his  gastroenterologist, follow-up as scheduled, please call to check on him 07/17/2024

## 2024-10-15 ENCOUNTER — Inpatient Hospital Stay: Admitting: Oncology

## 2024-10-15 ENCOUNTER — Inpatient Hospital Stay
